# Patient Record
Sex: Female | Born: 1975
Health system: Southern US, Community
[De-identification: ages and names within clinical notes are randomized; demographics above are authoritative.]

## PROBLEM LIST (undated history)

## (undated) DIAGNOSIS — M2550 Pain in unspecified joint: Secondary | ICD-10-CM

## (undated) DIAGNOSIS — E559 Vitamin D deficiency, unspecified: Secondary | ICD-10-CM

## (undated) DIAGNOSIS — K8020 Calculus of gallbladder without cholecystitis without obstruction: Secondary | ICD-10-CM

## (undated) DIAGNOSIS — I10 Essential (primary) hypertension: Secondary | ICD-10-CM

## (undated) DIAGNOSIS — D259 Leiomyoma of uterus, unspecified: Secondary | ICD-10-CM

## (undated) DIAGNOSIS — R7303 Prediabetes: Secondary | ICD-10-CM

## (undated) DIAGNOSIS — R748 Abnormal levels of other serum enzymes: Secondary | ICD-10-CM

## (undated) DIAGNOSIS — F32A Depression, unspecified: Secondary | ICD-10-CM

## (undated) DIAGNOSIS — K760 Fatty (change of) liver, not elsewhere classified: Secondary | ICD-10-CM

## (undated) DIAGNOSIS — E669 Obesity, unspecified: Secondary | ICD-10-CM

## (undated) DIAGNOSIS — E781 Pure hyperglyceridemia: Secondary | ICD-10-CM

## (undated) DIAGNOSIS — E785 Hyperlipidemia, unspecified: Secondary | ICD-10-CM

## (undated) DIAGNOSIS — E538 Deficiency of other specified B group vitamins: Secondary | ICD-10-CM

## (undated) DIAGNOSIS — F419 Anxiety disorder, unspecified: Secondary | ICD-10-CM

## (undated) HISTORY — DX: Pure hyperglyceridemia: E78.1

## (undated) HISTORY — DX: Hyperlipidemia, unspecified: E78.5

## (undated) HISTORY — DX: Leiomyoma of uterus, unspecified: D25.9

## (undated) HISTORY — DX: Essential (primary) hypertension: I10

## (undated) HISTORY — PX: UTERINE FIBROID SURGERY: SHX826

## (undated) HISTORY — DX: Obesity, unspecified: E66.9

## (undated) HISTORY — DX: Pain in unspecified joint: M25.50

## (undated) HISTORY — DX: Deficiency of other specified B group vitamins: E53.8

## (undated) HISTORY — DX: Calculus of gallbladder without cholecystitis without obstruction: K80.20

## (undated) HISTORY — DX: Prediabetes: R73.03

## (undated) HISTORY — DX: Vitamin D deficiency, unspecified: E55.9

## (undated) HISTORY — DX: Depression, unspecified: F32.A

---
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
## 2011-10-12 LAB — CBC AND DIFFERENTIAL
HEMATOCRIT: 40 % (ref 36–46)
Hemoglobin: 13.5 g/dL (ref 12.0–16.0)
Platelets: 390 10*3/uL (ref 150–399)
WBC: 6 10^3/mL

## 2011-10-12 LAB — HEPATIC FUNCTION PANEL
ALT: 27 U/L (ref 7–35)
AST: 23 U/L (ref 13–35)
Alkaline Phosphatase: 57 U/L (ref 25–125)
BILIRUBIN, TOTAL: 1.4 mg/dL

## 2011-10-12 LAB — BASIC METABOLIC PANEL
BUN: 7 mg/dL (ref 4–21)
CREATININE: 0.5 mg/dL (ref 0.5–1.1)
Glucose: 88 mg/dL
Potassium: 4.2 mmol/L (ref 3.4–5.3)
SODIUM: 138 mmol/L (ref 137–147)

## 2011-10-12 LAB — TSH: TSH: 1.3 u[IU]/mL (ref 0.41–5.90)

## 2011-10-12 LAB — LIPID PANEL
Cholesterol: 268 mg/dL — AB (ref 0–200)
HDL: 44 mg/dL (ref 35–70)
LDL CALC: 176 mg/dL
TRIGLYCERIDES: 221 mg/dL — AB (ref 40–160)

## 2013-08-23 LAB — HEPATIC FUNCTION PANEL
ALK PHOS: 53 U/L (ref 25–125)
ALT: 81 U/L — AB (ref 7–35)
AST: 46 U/L — AB (ref 13–35)
Bilirubin, Total: 1.4 mg/dL

## 2013-08-23 LAB — BASIC METABOLIC PANEL
BUN: 6 mg/dL (ref 4–21)
CREATININE: 0.5 mg/dL (ref <=1.1)
GLUCOSE: 97 mg/dL
Potassium: 4.5 mmol/L (ref 3.4–5.3)
Sodium: 137 mmol/L (ref 137–147)

## 2013-08-23 LAB — LIPID PANEL
CHOLESTEROL: 270 mg/dL — AB (ref 0–200)
HDL: 45 mg/dL (ref 35–70)
LDL CALC: 163 mg/dL
TRIGLYCERIDES: 348 mg/dL — AB (ref 40–160)

## 2016-01-01 LAB — LIPID PANEL
Cholesterol: 270 mg/dL — AB (ref 0–200)
Cholesterol: 270 mg/dL — AB (ref 0–200)
HDL: 30 mg/dL — AB (ref 35–70)
HDL: 30 mg/dL — AB (ref 35–70)
LDL Cholesterol: 172 mg/dL
LDL Cholesterol: 172 mg/dL
TRIGLYCERIDES: 339 mg/dL — AB (ref 40–160)
Triglycerides: 339 mg/dL — AB (ref 40–160)

## 2016-01-01 LAB — BASIC METABOLIC PANEL
BUN: 9 mg/dL (ref 4–21)
BUN: 9 mg/dL (ref 4–21)
CREATININE: 0.6 mg/dL (ref 0.5–1.1)
Creatinine: 0.6 mg/dL (ref 0.5–1.1)
GLUCOSE: 116 mg/dL
Glucose: 116 mg/dL
POTASSIUM: 4.4 mmol/L (ref 3.4–5.3)
POTASSIUM: 4.4 mmol/L (ref 3.4–5.3)
SODIUM: 141 mmol/L (ref 137–147)
Sodium: 141 mmol/L (ref 137–147)

## 2016-01-01 LAB — CBC AND DIFFERENTIAL
HCT: 39 % (ref 36–46)
HCT: 39 % (ref 36–46)
HEMOGLOBIN: 13.1 g/dL (ref 12.0–16.0)
HEMOGLOBIN: 13.1 g/dL (ref 12.0–16.0)
Neutrophils Absolute: 3 /uL
Platelets: 388 10*3/uL (ref 150–399)
Platelets: 388 10*3/uL (ref 150–399)
WBC: 5.9 10*3/mL
WBC: 5.9 10^3/mL

## 2016-01-01 LAB — HEPATIC FUNCTION PANEL
ALK PHOS: 67 U/L (ref 25–125)
ALT: 69 U/L — AB (ref 7–35)
ALT: 69 U/L — AB (ref 7–35)
AST: 42 U/L — AB (ref 13–35)
AST: 42 U/L — AB (ref 13–35)
Alkaline Phosphatase: 67 U/L (ref 25–125)
Bilirubin, Total: 0.8 mg/dL

## 2016-01-01 LAB — TSH
TSH: 1.46 u[IU]/mL (ref 0.41–5.90)
TSH: 1.46 u[IU]/mL (ref 0.41–5.90)

## 2016-01-05 LAB — VITAMIN D 25 HYDROXY (VIT D DEFICIENCY, FRACTURES): Vit D, 25-Hydroxy: 39

## 2016-04-22 LAB — BASIC METABOLIC PANEL
BUN: 8 mg/dL (ref 4–21)
CREATININE: 0.6 mg/dL (ref 0.5–1.1)
GLUCOSE: 113 mg/dL
Potassium: 4.3 mmol/L (ref 3.4–5.3)
Sodium: 140 mmol/L (ref 137–147)

## 2016-04-22 LAB — LIPID PANEL
Cholesterol: 211 mg/dL — AB (ref 0–200)
HDL: 33 mg/dL — AB (ref 35–70)
LDL CALC: 128 mg/dL
Triglycerides: 251 mg/dL — AB (ref 40–160)

## 2016-04-22 LAB — CBC AND DIFFERENTIAL
HCT: 39 % (ref 36–46)
Hemoglobin: 13.1 g/dL (ref 12.0–16.0)
Neutrophils Absolute: 4 /uL
PLATELETS: 359 10*3/uL (ref 150–399)
WBC: 5.8 10*3/mL

## 2016-04-22 LAB — HEPATIC FUNCTION PANEL
ALK PHOS: 67 U/L (ref 25–125)
ALT: 40 U/L — AB (ref 7–35)
AST: 31 U/L (ref 13–35)
BILIRUBIN, TOTAL: 1.2 mg/dL

## 2016-06-03 ENCOUNTER — Ambulatory Visit (INDEPENDENT_AMBULATORY_CARE_PROVIDER_SITE_OTHER): Payer: 59 | Admitting: Family Medicine

## 2016-06-03 ENCOUNTER — Encounter: Payer: Self-pay | Admitting: Family Medicine

## 2016-06-03 VITALS — BP 124/80 | HR 86 | Resp 12 | Ht 68.0 in | Wt 250.2 lb

## 2016-06-03 DIAGNOSIS — R7301 Impaired fasting glucose: Secondary | ICD-10-CM

## 2016-06-03 DIAGNOSIS — E782 Mixed hyperlipidemia: Secondary | ICD-10-CM | POA: Diagnosis not present

## 2016-06-03 DIAGNOSIS — Z6838 Body mass index (BMI) 38.0-38.9, adult: Secondary | ICD-10-CM | POA: Diagnosis not present

## 2016-06-03 DIAGNOSIS — R7401 Elevation of levels of liver transaminase levels: Secondary | ICD-10-CM

## 2016-06-03 DIAGNOSIS — R740 Nonspecific elevation of levels of transaminase and lactic acid dehydrogenase [LDH]: Secondary | ICD-10-CM | POA: Diagnosis not present

## 2016-06-03 NOTE — Progress Notes (Signed)
Pre visit review using our clinic review tool, if applicable. No additional management support is needed unless otherwise documented below in the visit note. 

## 2016-06-03 NOTE — Patient Instructions (Signed)
A few things to remember from today's visit:   Hyperlipidemia, mixed  15 min of daily brisk walking. Wine 2-4 oz daily. Fish 3 times per week. Fish oil (Megared) 1-3 g daily.   Please be sure medication list is accurate. If a new problem present, please set up appointment sooner than planned today.

## 2016-06-03 NOTE — Progress Notes (Signed)
HPI:   [name] is a 41 y.o. female, who is here today to establish care.  Former PCP: Dr Posey Pronto, [place] Last preventive routine visit: 12/2015.  Chronic medical problems: HLD,elevated BP.   Concerns today: HLD, wt.   Hyperlipidemia:  Currently on non pharmacologic treatment. Following a low fat diet: Yes. She is not exercising regularly due to work schedule.    Lab Results  Component Value Date   CHOL 211 (A) 04/22/2016   HDL 33 (A) 04/22/2016   LDLCALC 128 04/22/2016   TRIG 251 (A) 04/22/2016   Elevated BP's in prior OV's. She is not checking BP at home in the past 2 months. Denies severe/frequent headache, visual changes, chest pain, dyspnea, palpitation, claudication, focal weakness, or edema.  -She is trying to lose wt,has started a healthier diet,drinking smoothies with fruit and vegetables in the morning. She has noted some wt loss already. + Hx of pre diabetes, FG 03/2016 113. HgA1C 5.8 03/2016.  Mother with Hx of DM II.  In the past she has also have elevated transaminases,last LFT's wnl.  Denies abdominal pain, nausea, vomiting, changes in bowel habits,or alcohol abuse. Lab Results  Component Value Date   ALT 40 (A) 04/22/2016   AST 31 04/22/2016   ALKPHOS 67 04/22/2016     Review of Systems  Constitutional: Negative for activity change, appetite change, fatigue, fever and unexpected weight change.  HENT: Negative for mouth sores, nosebleeds and trouble swallowing.   Eyes: Negative for redness and visual disturbance.  Respiratory: Negative for cough, shortness of breath and wheezing.   Cardiovascular: Negative for chest pain, palpitations and leg swelling.  Gastrointestinal: Negative for abdominal pain, nausea and vomiting.       Negative for changes in bowel habits.  Endocrine: Negative for cold intolerance and heat intolerance.  Genitourinary: Negative for decreased urine volume and hematuria.  Musculoskeletal: Negative for gait problem  and myalgias.  Neurological: Negative for syncope, weakness and headaches.  Psychiatric/Behavioral: Negative for confusion. The patient is nervous/anxious.       No current outpatient prescriptions on file prior to visit.   No current facility-administered medications on file prior to visit.      Past Medical History:  Diagnosis Date  . Hyperlipidemia   . Hypertension    Not on File  Family History  Problem Relation Age of Onset  . Diabetes Mother   . Hyperlipidemia Father   . Hypertension Father   . Cancer Maternal Grandmother   . Alcohol abuse Maternal Grandfather     Social History   Social History  . Marital status: Married    Spouse name: N/A  . Number of children: N/A  . Years of education: N/A   Social History Main Topics  . Smoking status: Never Smoker  . Smokeless tobacco: Never Used  . Alcohol use Yes  . Drug use: No  . Sexual activity: Not Asked   Other Topics Concern  . None   Social History Narrative  . None    Vitals:   06/03/16 0921  BP: 124/80  Pulse: 86  Resp: 12   O2 sat at RA 98%. Body mass index is 38.05 kg/m.   Physical Exam  Nursing note and vitals reviewed. Constitutional: She is oriented to person, place, and time. She appears well-developed. No distress.  HENT:  Head: Atraumatic.  Mouth/Throat: Oropharynx is clear and moist and mucous membranes are normal.  Eyes: Conjunctivae and EOM are normal. Pupils are equal, round,  and reactive to light.  Neck: No tracheal deviation present. No thyroid mass and no thyromegaly present.  Cardiovascular: Normal rate and regular rhythm.   No murmur heard. Pulses:      Dorsalis pedis pulses are 2+ on the right side, and 2+ on the left side.  Respiratory: Effort normal and breath sounds normal. No respiratory distress.  GI: Soft. She exhibits no mass. There is no hepatomegaly. There is no tenderness.  Musculoskeletal: She exhibits edema (trace pitting edema LE,bilateral). She exhibits  no tenderness.  Lymphadenopathy:    She has no cervical adenopathy.  Neurological: She is alert and oriented to person, place, and time. She has normal strength. Coordination and gait normal.  Skin: Skin is warm. No erythema.  Psychiatric: She has a normal mood and affect.  Well groomed, good eye contact.      ASSESSMENT AND PLAN:     [name] was seen today for establish care.  Diagnoses and all orders for this visit:  IFG (impaired fasting glucose)  Primary prevention through a healthy diet and regular physical activity recommended.  Hyperlipidemia, mixed  Continue low fat diet for now. Low HDL,wine 2-4 oz daily may help along with low fat diet. Little of no red meet. OTC Fish oil may help with elevated TG.  Last FLP in 03/2016,so will plan on repeating labs in 2-3 months.  BMI 38.0-38.9,adult  We discussed benefits of wt loss as well as adverse effects of obesity. May consider pharmacologic treatment next OV. Consistency with healthy diet and physical activity recommended. Daily brisk walking for 15-30 min as tolerated.  Elevated transaminase level  ? NASH. Low fat diet. Wt loss. Will will continue following.      Darlene G. Martinique, MD  [facility]. [place] office.

## 2016-06-04 DIAGNOSIS — M5137 Other intervertebral disc degeneration, lumbosacral region: Secondary | ICD-10-CM | POA: Diagnosis not present

## 2016-06-04 DIAGNOSIS — M5386 Other specified dorsopathies, lumbar region: Secondary | ICD-10-CM | POA: Diagnosis not present

## 2016-06-04 DIAGNOSIS — M9903 Segmental and somatic dysfunction of lumbar region: Secondary | ICD-10-CM | POA: Diagnosis not present

## 2016-06-10 ENCOUNTER — Encounter: Payer: Self-pay | Admitting: Family Medicine

## 2016-06-10 DIAGNOSIS — F411 Generalized anxiety disorder: Secondary | ICD-10-CM | POA: Insufficient documentation

## 2016-06-10 DIAGNOSIS — E559 Vitamin D deficiency, unspecified: Secondary | ICD-10-CM | POA: Insufficient documentation

## 2016-06-18 DIAGNOSIS — M9903 Segmental and somatic dysfunction of lumbar region: Secondary | ICD-10-CM | POA: Diagnosis not present

## 2016-06-18 DIAGNOSIS — M5386 Other specified dorsopathies, lumbar region: Secondary | ICD-10-CM | POA: Diagnosis not present

## 2016-06-18 DIAGNOSIS — M5137 Other intervertebral disc degeneration, lumbosacral region: Secondary | ICD-10-CM | POA: Diagnosis not present

## 2016-07-08 DIAGNOSIS — M5137 Other intervertebral disc degeneration, lumbosacral region: Secondary | ICD-10-CM | POA: Diagnosis not present

## 2016-07-08 DIAGNOSIS — M5386 Other specified dorsopathies, lumbar region: Secondary | ICD-10-CM | POA: Diagnosis not present

## 2016-07-08 DIAGNOSIS — M9903 Segmental and somatic dysfunction of lumbar region: Secondary | ICD-10-CM | POA: Diagnosis not present

## 2016-07-22 DIAGNOSIS — M9903 Segmental and somatic dysfunction of lumbar region: Secondary | ICD-10-CM | POA: Diagnosis not present

## 2016-07-22 DIAGNOSIS — M5137 Other intervertebral disc degeneration, lumbosacral region: Secondary | ICD-10-CM | POA: Diagnosis not present

## 2016-07-22 DIAGNOSIS — M5386 Other specified dorsopathies, lumbar region: Secondary | ICD-10-CM | POA: Diagnosis not present

## 2016-08-05 DIAGNOSIS — M5137 Other intervertebral disc degeneration, lumbosacral region: Secondary | ICD-10-CM | POA: Diagnosis not present

## 2016-08-05 DIAGNOSIS — M9903 Segmental and somatic dysfunction of lumbar region: Secondary | ICD-10-CM | POA: Diagnosis not present

## 2016-08-05 DIAGNOSIS — M5386 Other specified dorsopathies, lumbar region: Secondary | ICD-10-CM | POA: Diagnosis not present

## 2016-08-19 DIAGNOSIS — M5386 Other specified dorsopathies, lumbar region: Secondary | ICD-10-CM | POA: Diagnosis not present

## 2016-08-19 DIAGNOSIS — M9903 Segmental and somatic dysfunction of lumbar region: Secondary | ICD-10-CM | POA: Diagnosis not present

## 2016-08-19 DIAGNOSIS — M5137 Other intervertebral disc degeneration, lumbosacral region: Secondary | ICD-10-CM | POA: Diagnosis not present

## 2016-09-02 DIAGNOSIS — M5386 Other specified dorsopathies, lumbar region: Secondary | ICD-10-CM | POA: Diagnosis not present

## 2016-09-02 DIAGNOSIS — M9903 Segmental and somatic dysfunction of lumbar region: Secondary | ICD-10-CM | POA: Diagnosis not present

## 2016-09-02 DIAGNOSIS — M5137 Other intervertebral disc degeneration, lumbosacral region: Secondary | ICD-10-CM | POA: Diagnosis not present

## 2016-09-23 DIAGNOSIS — M9903 Segmental and somatic dysfunction of lumbar region: Secondary | ICD-10-CM | POA: Diagnosis not present

## 2016-09-23 DIAGNOSIS — M5386 Other specified dorsopathies, lumbar region: Secondary | ICD-10-CM | POA: Diagnosis not present

## 2016-09-23 DIAGNOSIS — M5137 Other intervertebral disc degeneration, lumbosacral region: Secondary | ICD-10-CM | POA: Diagnosis not present

## 2016-10-05 DIAGNOSIS — H938X3 Other specified disorders of ear, bilateral: Secondary | ICD-10-CM | POA: Diagnosis not present

## 2016-10-05 DIAGNOSIS — H6123 Impacted cerumen, bilateral: Secondary | ICD-10-CM | POA: Diagnosis not present

## 2016-10-05 DIAGNOSIS — L299 Pruritus, unspecified: Secondary | ICD-10-CM | POA: Diagnosis not present

## 2016-10-14 DIAGNOSIS — M9903 Segmental and somatic dysfunction of lumbar region: Secondary | ICD-10-CM | POA: Diagnosis not present

## 2016-10-14 DIAGNOSIS — M5386 Other specified dorsopathies, lumbar region: Secondary | ICD-10-CM | POA: Diagnosis not present

## 2016-10-14 DIAGNOSIS — M5137 Other intervertebral disc degeneration, lumbosacral region: Secondary | ICD-10-CM | POA: Diagnosis not present

## 2016-11-02 DIAGNOSIS — M5386 Other specified dorsopathies, lumbar region: Secondary | ICD-10-CM | POA: Diagnosis not present

## 2016-11-02 DIAGNOSIS — M5137 Other intervertebral disc degeneration, lumbosacral region: Secondary | ICD-10-CM | POA: Diagnosis not present

## 2016-11-02 DIAGNOSIS — M9903 Segmental and somatic dysfunction of lumbar region: Secondary | ICD-10-CM | POA: Diagnosis not present

## 2016-11-25 DIAGNOSIS — M5137 Other intervertebral disc degeneration, lumbosacral region: Secondary | ICD-10-CM | POA: Diagnosis not present

## 2016-11-25 DIAGNOSIS — M9903 Segmental and somatic dysfunction of lumbar region: Secondary | ICD-10-CM | POA: Diagnosis not present

## 2016-11-25 DIAGNOSIS — M5386 Other specified dorsopathies, lumbar region: Secondary | ICD-10-CM | POA: Diagnosis not present

## 2016-12-18 DIAGNOSIS — M5137 Other intervertebral disc degeneration, lumbosacral region: Secondary | ICD-10-CM | POA: Diagnosis not present

## 2016-12-18 DIAGNOSIS — M9903 Segmental and somatic dysfunction of lumbar region: Secondary | ICD-10-CM | POA: Diagnosis not present

## 2016-12-18 DIAGNOSIS — M5386 Other specified dorsopathies, lumbar region: Secondary | ICD-10-CM | POA: Diagnosis not present

## 2017-01-12 DIAGNOSIS — Z23 Encounter for immunization: Secondary | ICD-10-CM | POA: Diagnosis not present

## 2017-10-06 DIAGNOSIS — L821 Other seborrheic keratosis: Secondary | ICD-10-CM | POA: Diagnosis not present

## 2017-10-06 DIAGNOSIS — L720 Epidermal cyst: Secondary | ICD-10-CM | POA: Diagnosis not present

## 2017-10-06 DIAGNOSIS — D2230 Melanocytic nevi of unspecified part of face: Secondary | ICD-10-CM | POA: Diagnosis not present

## 2017-10-06 DIAGNOSIS — B353 Tinea pedis: Secondary | ICD-10-CM | POA: Diagnosis not present

## 2017-10-08 ENCOUNTER — Other Ambulatory Visit (HOSPITAL_COMMUNITY)
Admission: RE | Admit: 2017-10-08 | Discharge: 2017-10-08 | Disposition: A | Discharge status: Home / Self Care | Payer: 59 | Source: Ambulatory Visit | Attending: Obstetrics and Gynecology | Admitting: Obstetrics and Gynecology

## 2017-10-08 ENCOUNTER — Other Ambulatory Visit: Payer: Self-pay | Admitting: Obstetrics and Gynecology

## 2017-10-08 DIAGNOSIS — Z01411 Encounter for gynecological examination (general) (routine) with abnormal findings: Secondary | ICD-10-CM | POA: Insufficient documentation

## 2017-10-08 DIAGNOSIS — D219 Benign neoplasm of connective and other soft tissue, unspecified: Secondary | ICD-10-CM | POA: Diagnosis not present

## 2017-10-08 DIAGNOSIS — R635 Abnormal weight gain: Secondary | ICD-10-CM | POA: Diagnosis not present

## 2017-10-08 DIAGNOSIS — N920 Excessive and frequent menstruation with regular cycle: Secondary | ICD-10-CM | POA: Diagnosis not present

## 2017-10-12 LAB — CYTOLOGY - PAP
DIAGNOSIS: NEGATIVE
HPV: NOT DETECTED

## 2017-11-01 DIAGNOSIS — N946 Dysmenorrhea, unspecified: Secondary | ICD-10-CM | POA: Diagnosis not present

## 2017-11-01 DIAGNOSIS — D219 Benign neoplasm of connective and other soft tissue, unspecified: Secondary | ICD-10-CM | POA: Diagnosis not present

## 2017-11-19 ENCOUNTER — Ambulatory Visit: Payer: 59 | Admitting: Family Medicine

## 2017-11-19 DIAGNOSIS — Z0289 Encounter for other administrative examinations: Secondary | ICD-10-CM

## 2017-11-26 ENCOUNTER — Ambulatory Visit (INDEPENDENT_AMBULATORY_CARE_PROVIDER_SITE_OTHER): Payer: 59 | Admitting: Family Medicine

## 2017-11-26 ENCOUNTER — Encounter: Payer: Self-pay | Admitting: Family Medicine

## 2017-11-26 VITALS — BP 122/80 | HR 85 | Temp 98.4°F | Ht 68.0 in | Wt 268.3 lb

## 2017-11-26 DIAGNOSIS — R748 Abnormal levels of other serum enzymes: Secondary | ICD-10-CM | POA: Diagnosis not present

## 2017-11-26 DIAGNOSIS — R7301 Impaired fasting glucose: Secondary | ICD-10-CM | POA: Diagnosis not present

## 2017-11-26 DIAGNOSIS — Z01818 Encounter for other preprocedural examination: Secondary | ICD-10-CM

## 2017-11-26 DIAGNOSIS — N924 Excessive bleeding in the premenopausal period: Secondary | ICD-10-CM

## 2017-11-26 DIAGNOSIS — E782 Mixed hyperlipidemia: Secondary | ICD-10-CM | POA: Diagnosis not present

## 2017-11-26 LAB — COMPREHENSIVE METABOLIC PANEL
ALT: 105 U/L — ABNORMAL HIGH (ref 0–35)
AST: 66 U/L — ABNORMAL HIGH (ref 0–37)
Albumin: 4.6 g/dL (ref 3.5–5.2)
Alkaline Phosphatase: 61 U/L (ref 39–117)
BUN: 8 mg/dL (ref 6–23)
CALCIUM: 9.4 mg/dL (ref 8.4–10.5)
CHLORIDE: 101 meq/L (ref 96–112)
CO2: 27 meq/L (ref 19–32)
Creatinine, Ser: 0.64 mg/dL (ref 0.40–1.20)
GFR: 108.85 mL/min (ref >60.00)
GLUCOSE: 113 mg/dL — AB (ref 70–99)
Potassium: 4.5 mEq/L (ref 3.5–5.1)
Sodium: 136 mEq/L (ref 135–145)
Total Bilirubin: 1.6 mg/dL — ABNORMAL HIGH (ref 0.2–1.2)
Total Protein: 7.2 g/dL (ref 6.0–8.3)

## 2017-11-26 LAB — CBC WITH DIFFERENTIAL/PLATELET
BASOS ABS: 0 10*3/uL (ref 0.0–0.1)
BASOS PCT: 0.5 % (ref 0.0–3.0)
EOS ABS: 0.3 10*3/uL (ref 0.0–0.7)
Eosinophils Relative: 4 % (ref 0.0–5.0)
HEMATOCRIT: 39.2 % (ref 36.0–46.0)
Hemoglobin: 13.2 g/dL (ref 12.0–15.0)
LYMPHS ABS: 2.1 10*3/uL (ref 0.7–4.0)
LYMPHS PCT: 29.1 % (ref 12.0–46.0)
MCHC: 33.6 g/dL (ref 30.0–36.0)
MCV: 88.1 fl (ref 78.0–100.0)
MONOS PCT: 7.8 % (ref 3.0–12.0)
Monocytes Absolute: 0.6 10*3/uL (ref 0.1–1.0)
NEUTROS ABS: 4.1 10*3/uL (ref 1.4–7.7)
NEUTROS PCT: 58.6 % (ref 43.0–77.0)
PLATELETS: 363 10*3/uL (ref 150.0–400.0)
RBC: 4.45 Mil/uL (ref 3.87–5.11)
RDW: 13 % (ref 11.5–15.5)
WBC: 7.1 10*3/uL (ref 4.0–10.5)

## 2017-11-26 LAB — POCT URINALYSIS DIPSTICK
Bilirubin, UA: NEGATIVE
Blood, UA: NEGATIVE
Glucose, UA: NEGATIVE
Ketones, UA: NEGATIVE
LEUKOCYTES UA: NEGATIVE
NITRITE UA: NEGATIVE
PH UA: 6 (ref 5.0–8.0)
PROTEIN UA: NEGATIVE
Spec Grav, UA: 1.025 (ref 1.010–1.025)
UROBILINOGEN UA: 0.2 U/dL

## 2017-11-26 LAB — LIPID PANEL
CHOL/HDL RATIO: 6
CHOLESTEROL: 242 mg/dL — AB (ref 0–200)
HDL: 37.7 mg/dL — ABNORMAL LOW (ref >39.00)
NonHDL: 204.57
TRIGLYCERIDES: 331 mg/dL — AB (ref 0.0–149.0)
VLDL: 66.2 mg/dL — AB (ref 0.0–40.0)

## 2017-11-26 LAB — TSH: TSH: 1.82 u[IU]/mL (ref 0.35–4.50)

## 2017-11-26 LAB — HEMOGLOBIN A1C: Hgb A1c MFr Bld: 5.6 % (ref 4.6–6.5)

## 2017-11-26 LAB — LDL CHOLESTEROL, DIRECT: LDL DIRECT: 166 mg/dL

## 2017-11-26 NOTE — Patient Instructions (Signed)
Complete bloodwork today.   Recommend scheduling follow up physical with Dr. Martinique after your upcoming surgery.

## 2017-11-26 NOTE — Addendum Note (Signed)
Addended by: Rene Kocher on: 11/26/2017 10:36 AM   Modules accepted: Orders

## 2017-11-26 NOTE — Progress Notes (Signed)
Darlene Mccormick Date of Birth:  08-25-1975  This patient presents to the office today for a preoperative consultation at the request of surgeon, Dr. Thurnell Lose, who plans on performing myomectomy onOctober and date is not yet set.   In a years time has grown additional fibroids and ones previously there have enlarged. Largest is 13cm in diameter. Has a mixture of symptoms. Has a couple of bad, more intense days of period. Advil does help. Pressure sensation. Discomfort with intercourse - more pressure.   Planned anesthesia: general  Known anesthesia problems: Negative  Bleeding risk: no hx of bleeding; no bleeding issues with surgery Personal or FH of DVT/PE: no    Patient Active Problem List   Diagnosis Date Noted  . Vitamin D deficiency 06/10/2016  . GAD (generalized anxiety disorder) 06/10/2016  . Hyperlipidemia, mixed 06/03/2016  . Morbid obesity ([place]) 06/03/2016   Past Surgical History:  Procedure Laterality Date  . APPENDECTOMY  1997  . TONSILLECTOMY AND ADENOIDECTOMY  1988  . TRACHEOSTOMY  1979   done during epiglotitis    No Known Allergies No outpatient medications have been marked as taking for the 11/26/17 encounter (Office Visit) with Caren Macadam, MD.    Social History   Tobacco Use  . Smoking status: Never Smoker  . Smokeless tobacco: Never Used  Substance Use Topics  . Alcohol use: Yes   Family History  Problem Relation Age of Onset  . Diabetes Mother   . Other Mother        traumatic brain injury  . Hyperlipidemia Father   . Hypertension Father   . Breast cancer Maternal Grandmother   . Alcohol abuse Maternal Grandfather   . High blood pressure Brother   . Breast cancer Other   . Heart attack Paternal Grandmother 36    Review of Systems ROS is negative except for uterine/menstrual symptoms noted above. Specifically Darlene Mccormick denies and sick symptoms, chest congestion/SOB/wheezing, chest pain/palpitations.    Recent Labs: CBC:  Lab  Results  Component Value Date   WBC 5.8 04/22/2016   HGB 13.1 04/22/2016   HCT 39 04/22/2016   PLT 359 04/22/2016   CMP:  Lab Results  Component Value Date   NA 140 04/22/2016   K 4.3 04/22/2016   BUN 8 04/22/2016   CREATININE 0.6 04/22/2016   ALKPHOS 67 04/22/2016   ALT 40 (A) 04/22/2016   AST 31 04/22/2016    HBA1C: No results found for: LABA1C, EAG  Objective:   BP 122/80 (BP Location: Left Arm, Patient Position: Sitting, Cuff Size: Large)   Pulse 85   Temp 98.4 F (36.9 C) (Oral)   Ht 5\' 8"  (1.727 m)   Wt 268 lb 4.8 oz (121.7 kg)   SpO2 99%   BMI 40.79 kg/m  Weight: 268 lb 4.8 oz (121.7 kg)  Physical Exam  Constitutional: Darlene Mccormick is oriented to person, place, and time. Darlene Mccormick appears well-developed and well-nourished. No distress.  HENT:  Head: Normocephalic and atraumatic.  Right Ear: Hearing and external ear normal.  Left Ear: Hearing and external ear normal.  Mouth/Throat: Uvula is midline and oropharynx is clear and moist. No oropharyngeal exudate.  Eyes: Pupils are equal, round, and reactive to light. Conjunctivae are normal.  Neck: Normal range of motion. Neck supple. No thyroid mass and no thyromegaly present.  Cardiovascular: Normal rate, regular rhythm, normal heart sounds and intact distal pulses. Exam reveals no gallop and no friction rub.  No murmur heard. Pulmonary/Chest: Effort normal and  breath sounds normal. Darlene Mccormick has no decreased breath sounds. Darlene Mccormick has no wheezes. Darlene Mccormick has no rhonchi.  Abdominal: Soft. Bowel sounds are normal. Darlene Mccormick exhibits no distension and no mass. There is no tenderness. There is no guarding. No hernia.  Lower abdomen - largest fibroid is palpable on suprapubic exam and extends to approx 3cm inferior of umbilicus.  Musculoskeletal: Normal range of motion. Darlene Mccormick exhibits no edema, tenderness or deformity.  Lymphadenopathy:    Darlene Mccormick has no cervical adenopathy.  Neurological: Darlene Mccormick is alert and oriented to person, place, and time. Darlene Mccormick has normal  strength. Darlene Mccormick displays normal reflexes.  Reflex Scores:      Tricep reflexes are 2+ on the right side and 2+ on the left side.      Bicep reflexes are 2+ on the right side and 2+ on the left side.      Brachioradialis reflexes are 2+ on the right side and 2+ on the left side.      Patellar reflexes are 2+ on the right side and 2+ on the left side. Skin: Skin is warm and dry. No rash noted.  Psychiatric: Darlene Mccormick has a normal mood and affect. Darlene Mccormick speech is normal and behavior is normal. Thought content normal.     EKG Interpretation:  normal EKG, normal sinus rhythm, there are no previous tracings available for comparison.  Lab Review no bloodwork since 2018; labs have been reordered.     Assessment:   Darlene Mccormick was seen today for surgical clearance.  Diagnoses and all orders for this visit:  Preoperative general physical examination -     EKG 12-Lead: stable. No acute changes. Sinus rhythm and within normal limits. -     CBC with Differential/Platelet; Future -     Urinalysis  Hyperlipidemia, mixed -     Lipid panel; Future  Elevated fasting glucose -     Comprehensive metabolic panel; Future; A1C added  Elevated liver enzymes -     Comprehensive metabolic panel; Future  Excessive bleeding in premenopausal period -     CBC with Differential/Platelet; Future -     TSH; Future   42 y.o.patient  approved for Surgery     Plan:   1. Preoperative workup as follows:Darlene Mccormick is due for repeat bloodwork to evaluate hyperlipidemia, liver enzyme elevation, elevated glucose. I have ordered these and will review once available. Would like to make sure cbc stable and glucose controlled prior to surgery. 2. Change in medication regimen before surgery: none 3. No contraindications to planned surgery although recommend review of bloodwork- see above.  Note electronically signed by provider.  Micheline Rough, MD  We have faxed a copy of this note to obgyn provider.

## 2017-11-26 NOTE — Addendum Note (Signed)
Addended by: Rene Kocher on: 11/26/2017 10:37 AM   Modules accepted: Orders

## 2017-11-30 ENCOUNTER — Telehealth: Payer: Self-pay | Admitting: Family Medicine

## 2017-11-30 ENCOUNTER — Other Ambulatory Visit: Payer: Self-pay | Admitting: Family Medicine

## 2017-11-30 DIAGNOSIS — R748 Abnormal levels of other serum enzymes: Secondary | ICD-10-CM

## 2017-11-30 NOTE — Telephone Encounter (Signed)
Pt is requesting transfer of care from Dr Martinique to Dr Ethlyn Gallery.

## 2017-12-01 NOTE — Telephone Encounter (Signed)
It is fine by me.

## 2017-12-01 NOTE — Telephone Encounter (Signed)
ok 

## 2017-12-12 DIAGNOSIS — Z23 Encounter for immunization: Secondary | ICD-10-CM | POA: Diagnosis not present

## 2017-12-14 ENCOUNTER — Ambulatory Visit
Admission: RE | Admit: 2017-12-14 | Discharge: 2017-12-14 | Disposition: A | Discharge status: Home / Self Care | Payer: 59 | Source: Ambulatory Visit | Attending: Family Medicine | Admitting: Family Medicine

## 2017-12-14 DIAGNOSIS — K8020 Calculus of gallbladder without cholecystitis without obstruction: Secondary | ICD-10-CM | POA: Diagnosis not present

## 2017-12-14 DIAGNOSIS — R748 Abnormal levels of other serum enzymes: Secondary | ICD-10-CM

## 2017-12-29 ENCOUNTER — Ambulatory Visit: Payer: 59 | Admitting: Family Medicine

## 2017-12-29 ENCOUNTER — Encounter: Payer: Self-pay | Admitting: Family Medicine

## 2017-12-29 VITALS — BP 130/84 | HR 94 | Temp 98.5°F | Wt 264.9 lb

## 2017-12-29 DIAGNOSIS — R748 Abnormal levels of other serum enzymes: Secondary | ICD-10-CM | POA: Diagnosis not present

## 2017-12-29 DIAGNOSIS — D219 Benign neoplasm of connective and other soft tissue, unspecified: Secondary | ICD-10-CM

## 2017-12-29 DIAGNOSIS — R17 Unspecified jaundice: Secondary | ICD-10-CM | POA: Diagnosis not present

## 2017-12-29 DIAGNOSIS — K760 Fatty (change of) liver, not elsewhere classified: Secondary | ICD-10-CM | POA: Diagnosis not present

## 2017-12-29 NOTE — Progress Notes (Signed)
Darlene Mccormick DOB: 1975-10-15 Encounter date: 12/29/2017  This is a 42 y.o. female who presents with Chief Complaint  Patient presents with  . Follow-up    radiology found a galstone, would like to discuss    History of present illness:  Difficulty with exercise because of size of fibroids; can feel them move with bouncing exercise. Difficulty bending over. Does walk on regular basis.   Work is stressful.   Does protein shake with spinach/greens for breakfast.    We reviewed [place] results together and discussed that large stone in neck of GB does not require further evaluation/treatment unless symptoms related to gallbladder arise. We reviewed bloodwork including elevated cholesterol and elevated liver enzymes.   Plan is for myomectomy in late November.  No Known Allergies No outpatient medications have been marked as taking for the 12/29/17 encounter (Office Visit) with Caren Macadam, MD.    Review of Systems  Constitutional: Negative for chills, fatigue and fever.  Respiratory: Negative for cough, chest tightness, shortness of breath and wheezing.   Cardiovascular: Negative for chest pain, palpitations and leg swelling.  Gastrointestinal: Positive for abdominal distention (feels full from enlarged fibroids). Negative for abdominal pain, constipation, diarrhea, nausea and vomiting.       Discomfort from large fibroids. Bother her with activity (running/bouncing), bending.  Musculoskeletal: Negative for arthralgias and back pain.    Objective:  BP 130/84 (BP Location: Left Arm, Patient Position: Sitting, Cuff Size: Large)   Pulse 94   Temp 98.5 F (36.9 C) (Oral)   Wt 264 lb 14.4 oz (120.2 kg)   SpO2 98%   BMI 40.28 kg/m   Weight: 264 lb 14.4 oz (120.2 kg)   BP Readings from Last 3 Encounters:  12/29/17 130/84  11/26/17 122/80  06/03/16 124/80   Wt Readings from Last 3 Encounters:  12/29/17 264 lb 14.4 oz (120.2 kg)  11/26/17 268 lb 4.8 oz (121.7 kg)   06/03/16 250 lb 4 oz (113.5 kg)    Physical Exam  Constitutional: She is oriented to person, place, and time. She appears well-developed and well-nourished. No distress.  Cardiovascular: Normal rate, regular rhythm and normal heart sounds. Exam reveals no friction rub.  No murmur heard. No lower extremity edema  Pulmonary/Chest: Effort normal and breath sounds normal. No respiratory distress. She has no wheezes. She has no rales.  Abdominal: Soft. Bowel sounds are normal. She exhibits mass (enlarged fibroid palpable). She exhibits no distension. There is tenderness. There is no rebound and no guarding.  Neurological: She is alert and oriented to person, place, and time.  Psychiatric: She has a normal mood and affect. Her behavior is normal. Cognition and memory are normal.    Assessment/Plan 1. Fatty liver Discussed benefits of weight loss, mediterranean diet. She had read online about ways to help with fatty liver, so we just reviewed these together. We discussed benefits of HIT exercise programs and moving forward through frustrations of weight loss plateaus.  - Comprehensive metabolic panel; Future  2. Elevated liver enzymes Suspect related to fatty liver. Reassuring that alk phos normal. Will get baseline hep b/c screen. Plan to recheck enzymes for stability in a few months.  - Hepatitis B surface antigen; Future - Hepatitis c antibody (reflex); Future  3. Elevated bilirubin Recheck in a month.   4. Fibroids I did sign surgical clearance form for myomectomy. We discussed benefits of weight loss and core strengthening prior to surgery.   5. Obesity: We spent 20+ minutes of the  25 minute visit discussing weight loss techniques/plans and reviewing benefits of dietary changes. We reviewed gallbladder anatomy; [place] results and discussed potential problems with gallstones. We reviewed bloodwork and discussed fatty liver and bilirubin/liver enzyme elevation.   Return pending  bloodwork.      Micheline Rough, MD

## 2017-12-29 NOTE — Patient Instructions (Addendum)
Repeat bloodwork in 1-2 months.   Recommend Mediterranean diet and working on higher intensity exercise.    Why is Exercise Important? If I told you I had a single pill that would help you decrease stress by improving anxiety, decreasing depression, help you achieve a healthy weight, give you more energy, make you more productive, help you focus, decrease your risk of dementia/heart attack/stroke/falls, improve your bone health, and more would you be interested? These are just some of the benefits that exercise brings to you. IT IS WORTH carving out some time every day to fit in exercise. It will help in every aspect of your health. Even if you have injuries that prevent you from participating in a type of exercise you used to do; there is always something that you can do to keep exercise a part of your life. If improving your health is important, make exercise your priority. It is worth the time! If you have questions about the type of exercise that is right for you, please talk with me about this!     Exercising to Stay Healthy  Exercising regularly is important. It has many health benefits, such as:  Improving your overall fitness, flexibility, and endurance.  Increasing your bone density.  Helping with weight control.  Decreasing your body fat.  Increasing your muscle strength.  Reducing stress and tension.  Improving your overall health.   In order to become healthy and stay healthy, it is recommended that you do moderate-intensity and vigorous-intensity exercise. You can tell that you are exercising at a moderate intensity if you have a higher heart rate and faster breathing, but you are still able to hold a conversation. You can tell that you are exercising at a vigorous intensity if you are breathing much harder and faster and cannot hold a conversation while exercising. How often should I exercise? Choose an activity that you enjoy and set realistic goals. Your health care  provider can help you to make an activity plan that works for you. Exercise regularly as directed by your health care provider. This may include:  Doing resistance training twice each week, such as: ? Push-ups. ? Sit-ups. ? Lifting weights. ? Using resistance bands.  Doing a given intensity of exercise for a given amount of time. Choose from these options: ? 150 minutes of moderate-intensity exercise every week. ? 75 minutes of vigorous-intensity exercise every week. ? A mix of moderate-intensity and vigorous-intensity exercise every week.   Children, pregnant women, people who are out of shape, people who are overweight, and older adults may need to consult a health care provider for individual recommendations. If you have any sort of medical condition, be sure to consult your health care provider before starting a new exercise program. What are some exercise ideas? Some moderate-intensity exercise ideas include:  Walking at a rate of 1 mile in 15 minutes.  Biking.  Hiking.  Golfing.  Dancing.   Some vigorous-intensity exercise ideas include:  Walking at a rate of at least 4.5 miles per hour.  Jogging or running at a rate of 5 miles per hour.  Biking at a rate of at least 10 miles per hour.  Lap swimming.  Roller-skating or in-line skating.  Cross-country skiing.  Vigorous competitive sports, such as football, basketball, and soccer.  Jumping rope.  Aerobic dancing.   What are some everyday activities that can help me to get exercise?  [place] work, such as: ? Pushing a Conservation officer, nature. ? Raking and bagging leaves.  Washing and waxing your car.  Pushing a stroller.  Shoveling snow.  Gardening.  Washing windows or floors. How can I be more active in my day-to-day activities?  Use the stairs instead of the elevator.  Take a walk during your lunch break.  If you drive, park your car farther away from work or school.  If you take public transportation, get  off one stop early and walk the rest of the way.  Make all of your phone calls while standing up and walking around.  Get up, stretch, and walk around every 30 minutes throughout the day. What guidelines should I follow while exercising?  Do not exercise so much that you hurt yourself, feel dizzy, or get very short of breath.  Consult your health care provider before starting a new exercise program.  Wear comfortable clothes and shoes with good support.  Drink plenty of water while you exercise to prevent dehydration or heat stroke. Body water is lost during exercise and must be replaced.  Work out until you breathe faster and your heart beats faster. This information is not intended to replace advice given to you by your health care provider. Make sure you discuss any questions you have with your health care provider.

## 2018-01-24 DIAGNOSIS — Z01818 Encounter for other preprocedural examination: Secondary | ICD-10-CM | POA: Diagnosis not present

## 2018-01-24 DIAGNOSIS — D219 Benign neoplasm of connective and other soft tissue, unspecified: Secondary | ICD-10-CM | POA: Diagnosis not present

## 2018-01-28 ENCOUNTER — Encounter (HOSPITAL_COMMUNITY): Payer: Self-pay | Admitting: *Deleted

## 2018-02-08 NOTE — Patient Instructions (Addendum)
Your procedure is scheduled on:Wednesday November 20 at 8:30 am  Enter through the [employer] [facility] at: 7:00am  Pick up the phone at the desk and dial [phone].  Call this number if you have problems the morning of surgery: [phone].  Remember: Do NOT eat food or drink any liquids: after Midnight Tuesday   Take these medicines the morning of surgery with a SIP OF WATER:none  Stop all vitamins, supplements, herbal medications now  Do not smoke day of surgery  Brush your teeth day of surgery  Do NOT wear jewelry (body piercing), metal hair clips/bobby pins, make-up, or nail polish. Do NOT wear lotions, powders, or perfumes.  You may wear deoderant. Do NOT shave for 48 hours prior to surgery. Do NOT bring valuables to the hospital. Contacts, dentures, or bridgework may not be worn into surgery. Leave suitcase in car.  After surgery it may be brought to your room.  For patients admitted to the hospital, checkout time is 11:00 AM the day of discharge.

## 2018-02-09 ENCOUNTER — Other Ambulatory Visit: Payer: Self-pay

## 2018-02-09 ENCOUNTER — Encounter (HOSPITAL_COMMUNITY)
Admission: RE | Admit: 2018-02-09 | Discharge: 2018-02-09 | Disposition: A | Discharge status: Home / Self Care | Payer: 59 | Source: Ambulatory Visit | Attending: Obstetrics and Gynecology | Admitting: Obstetrics and Gynecology

## 2018-02-09 ENCOUNTER — Encounter (HOSPITAL_COMMUNITY): Payer: Self-pay

## 2018-02-09 DIAGNOSIS — Z01812 Encounter for preprocedural laboratory examination: Secondary | ICD-10-CM | POA: Insufficient documentation

## 2018-02-09 LAB — CBC
HEMATOCRIT: 39.3 % (ref 36.0–46.0)
Hemoglobin: 13.2 g/dL (ref 12.0–15.0)
MCH: 29.9 pg (ref 26.0–34.0)
MCHC: 33.6 g/dL (ref 30.0–36.0)
MCV: 89.1 fL (ref 80.0–100.0)
NRBC: 0 % (ref 0.0–0.2)
PLATELETS: 362 10*3/uL (ref 150–400)
RBC: 4.41 MIL/uL (ref 3.87–5.11)
RDW: 12.5 % (ref 11.5–15.5)
WBC: 6.1 10*3/uL (ref 4.0–10.5)

## 2018-02-15 NOTE — Anesthesia Preprocedure Evaluation (Addendum)
Anesthesia Evaluation  Patient identified by MRN, date of birth, ID band Patient awake    Reviewed: Allergy & Precautions, NPO status , Patient's Chart, lab work & pertinent test results  History of Anesthesia Complications Negative for: history of anesthetic complications  Airway Mallampati: III  TM Distance: >3 FB Neck ROM: Full    Dental no notable dental hx. (+) Teeth Intact, Dental Advisory Given   Pulmonary neg pulmonary ROS,    Pulmonary exam normal breath sounds clear to auscultation       Cardiovascular hypertension, Normal cardiovascular exam Rhythm:Regular Rate:Normal     Neuro/Psych Anxiety negative neurological ROS     GI/Hepatic negative GI ROS, Neg liver ROS,   Endo/Other  Morbid obesity  Renal/GU negative Renal ROS     Musculoskeletal negative musculoskeletal ROS (+)   Abdominal   Peds  Hematology negative hematology ROS (+)   Anesthesia Other Findings Day of surgery medications reviewed with the patient.  Reproductive/Obstetrics                           Anesthesia Physical Anesthesia Plan  ASA: III  Anesthesia Plan: General   Post-op Pain Management:    Induction: Intravenous  PONV Risk Score and Plan: 3 and Treatment may vary due to age or medical condition, Ondansetron, Dexamethasone and Midazolam  Airway Management Planned: Oral ETT  Additional Equipment:   Intra-op Plan:   Post-operative Plan: Extubation in OR  Informed Consent: I have reviewed the patients History and Physical, chart, labs and discussed the procedure including the risks, benefits and alternatives for the proposed anesthesia with the patient or authorized representative who has indicated his/her understanding and acceptance.   Dental advisory given  Plan Discussed with: CRNA  Anesthesia Plan Comments:        Anesthesia Quick Evaluation

## 2018-02-16 ENCOUNTER — Encounter (HOSPITAL_COMMUNITY)
Admission: AD | Disposition: A | Discharge status: Home / Self Care | Payer: Self-pay | Source: Home / Self Care | Attending: Obstetrics and Gynecology

## 2018-02-16 ENCOUNTER — Inpatient Hospital Stay (HOSPITAL_COMMUNITY): Payer: 59 | Admitting: Anesthesiology

## 2018-02-16 ENCOUNTER — Other Ambulatory Visit: Payer: Self-pay

## 2018-02-16 ENCOUNTER — Encounter (HOSPITAL_COMMUNITY): Payer: Self-pay | Admitting: Anesthesiology

## 2018-02-16 ENCOUNTER — Inpatient Hospital Stay (HOSPITAL_COMMUNITY)
Admission: AD | Admit: 2018-02-16 | Discharge: 2018-02-18 | DRG: 742 | Disposition: A | Discharge status: Home / Self Care | Payer: 59 | Attending: Obstetrics and Gynecology | Admitting: Obstetrics and Gynecology

## 2018-02-16 DIAGNOSIS — N838 Other noninflammatory disorders of ovary, fallopian tube and broad ligament: Secondary | ICD-10-CM | POA: Diagnosis present

## 2018-02-16 DIAGNOSIS — D219 Benign neoplasm of connective and other soft tissue, unspecified: Secondary | ICD-10-CM | POA: Diagnosis present

## 2018-02-16 DIAGNOSIS — Z6841 Body Mass Index (BMI) 40.0 and over, adult: Secondary | ICD-10-CM | POA: Diagnosis not present

## 2018-02-16 DIAGNOSIS — D259 Leiomyoma of uterus, unspecified: Principal | ICD-10-CM | POA: Diagnosis present

## 2018-02-16 DIAGNOSIS — Z9889 Other specified postprocedural states: Secondary | ICD-10-CM

## 2018-02-16 HISTORY — PX: MYOMECTOMY: SHX85

## 2018-02-16 HISTORY — DX: Anxiety disorder, unspecified: F41.9

## 2018-02-16 HISTORY — DX: Fatty (change of) liver, not elsewhere classified: K76.0

## 2018-02-16 HISTORY — DX: Abnormal levels of other serum enzymes: R74.8

## 2018-02-16 LAB — COMPREHENSIVE METABOLIC PANEL
ALK PHOS: 57 U/L (ref 38–126)
ALT: 69 U/L — ABNORMAL HIGH (ref 0–44)
AST: 42 U/L — ABNORMAL HIGH (ref 15–41)
Albumin: 4.4 g/dL (ref 3.5–5.0)
Anion gap: 11 (ref 5–15)
BILIRUBIN TOTAL: 1.5 mg/dL — AB (ref 0.3–1.2)
BUN: 10 mg/dL (ref 6–20)
CALCIUM: 8.9 mg/dL (ref 8.9–10.3)
CO2: 22 mmol/L (ref 22–32)
Chloride: 104 mmol/L (ref 98–111)
Creatinine, Ser: 0.65 mg/dL (ref 0.44–1.00)
Glucose, Bld: 121 mg/dL — ABNORMAL HIGH (ref 70–99)
Potassium: 4.1 mmol/L (ref 3.5–5.1)
Sodium: 137 mmol/L (ref 135–145)
TOTAL PROTEIN: 7.7 g/dL (ref 6.5–8.1)

## 2018-02-16 LAB — CBC
HEMATOCRIT: 40.2 % (ref 36.0–46.0)
Hemoglobin: 13.4 g/dL (ref 12.0–15.0)
MCH: 29.8 pg (ref 26.0–34.0)
MCHC: 33.3 g/dL (ref 30.0–36.0)
MCV: 89.3 fL (ref 80.0–100.0)
Platelets: 340 10*3/uL (ref 150–400)
RBC: 4.5 MIL/uL (ref 3.87–5.11)
RDW: 12.6 % (ref 11.5–15.5)
WBC: 7.5 10*3/uL (ref 4.0–10.5)
nRBC: 0 % (ref 0.0–0.2)

## 2018-02-16 LAB — TYPE AND SCREEN
ABO/RH(D): O POS
ANTIBODY SCREEN: NEGATIVE

## 2018-02-16 LAB — ABO/RH: ABO/RH(D): O POS

## 2018-02-16 LAB — PREGNANCY, URINE: Preg Test, Ur: NEGATIVE

## 2018-02-16 SURGERY — MYOMECTOMY, ABDOMINAL APPROACH
Anesthesia: General

## 2018-02-16 MED ORDER — DEXAMETHASONE SODIUM PHOSPHATE 4 MG/ML IJ SOLN
INTRAMUSCULAR | Status: DC | PRN
  Administered 2018-02-16: 8 mg via INTRAVENOUS

## 2018-02-16 MED ORDER — MIDAZOLAM HCL 2 MG/2ML IJ SOLN
INTRAMUSCULAR | Status: DC | PRN
  Administered 2018-02-16: 2 mg via INTRAVENOUS

## 2018-02-16 MED ORDER — SCOPOLAMINE 1 MG/3DAYS TD PT72
MEDICATED_PATCH | TRANSDERMAL | Status: AC
  Administered 2018-02-16: 1.5 mg via TRANSDERMAL
  Filled 2018-02-16: qty 1

## 2018-02-16 MED ORDER — HYDROMORPHONE HCL 1 MG/ML IJ SOLN
INTRAMUSCULAR | Status: DC | PRN
  Administered 2018-02-16: 1 mg via INTRAVENOUS

## 2018-02-16 MED ORDER — CEFAZOLIN SODIUM-DEXTROSE 2-3 GM-%(50ML) IV SOLR
INTRAVENOUS | Status: DC | PRN
  Administered 2018-02-16: 2 g via INTRAVENOUS

## 2018-02-16 MED ORDER — DEXAMETHASONE SODIUM PHOSPHATE 4 MG/ML IJ SOLN
INTRAMUSCULAR | Status: AC
  Filled 2018-02-16: qty 1

## 2018-02-16 MED ORDER — VASOPRESSIN 20 UNIT/ML IV SOLN
INTRAVENOUS | Status: DC | PRN
  Administered 2018-02-16: 19 mL via INTRAMUSCULAR

## 2018-02-16 MED ORDER — ONDANSETRON HCL 4 MG/2ML IJ SOLN
INTRAMUSCULAR | Status: DC | PRN
  Administered 2018-02-16: 4 mg via INTRAVENOUS

## 2018-02-16 MED ORDER — FENTANYL CITRATE (PF) 100 MCG/2ML IJ SOLN
INTRAMUSCULAR | Status: AC
  Filled 2018-02-16: qty 2

## 2018-02-16 MED ORDER — DOCUSATE SODIUM 100 MG PO CAPS
100.0000 mg | ORAL_CAPSULE | Freq: Two times a day (BID) | ORAL | Status: DC
  Administered 2018-02-16 – 2018-02-18 (×5): 100 mg via ORAL
  Filled 2018-02-16 (×6): qty 1

## 2018-02-16 MED ORDER — METOCLOPRAMIDE HCL 5 MG/ML IJ SOLN
INTRAMUSCULAR | Status: DC | PRN
  Administered 2018-02-16: 10 mg via INTRAVENOUS

## 2018-02-16 MED ORDER — LIDOCAINE HCL (CARDIAC) PF 100 MG/5ML IV SOSY
PREFILLED_SYRINGE | INTRAVENOUS | Status: DC | PRN
  Administered 2018-02-16: 40 mg via INTRAVENOUS

## 2018-02-16 MED ORDER — SIMETHICONE 80 MG PO CHEW: 80.0000 mg | CHEWABLE_TABLET | Freq: Four times a day (QID) | ORAL | Status: DC | PRN

## 2018-02-16 MED ORDER — PROMETHAZINE HCL 25 MG/ML IJ SOLN: 6.2500 mg | INTRAMUSCULAR | Status: DC | PRN

## 2018-02-16 MED ORDER — METOCLOPRAMIDE HCL 5 MG/ML IJ SOLN
INTRAMUSCULAR | Status: AC
  Filled 2018-02-16: qty 2

## 2018-02-16 MED ORDER — HYDROMORPHONE HCL 1 MG/ML IJ SOLN: 0.2000 mg | INTRAMUSCULAR | Status: DC | PRN

## 2018-02-16 MED ORDER — DEXTROSE 5 % IV SOLN
3.0000 g | INTRAVENOUS | Status: DC
  Filled 2018-02-16: qty 3000

## 2018-02-16 MED ORDER — TRAMADOL HCL 50 MG PO TABS: 50.0000 mg | ORAL_TABLET | Freq: Four times a day (QID) | ORAL | Status: DC | PRN

## 2018-02-16 MED ORDER — BUPIVACAINE LIPOSOME 1.3 % IJ SUSP
20.0000 mL | Freq: Once | INTRAMUSCULAR | Status: AC
  Administered 2018-02-16: 20 mL
  Filled 2018-02-16: qty 20

## 2018-02-16 MED ORDER — MENTHOL 3 MG MT LOZG
1.0000 | LOZENGE | OROMUCOSAL | Status: DC | PRN
  Administered 2018-02-16: 3 mg via ORAL
  Filled 2018-02-16: qty 9

## 2018-02-16 MED ORDER — ONDANSETRON HCL 4 MG/2ML IJ SOLN: 4.0000 mg | Freq: Four times a day (QID) | INTRAMUSCULAR | Status: DC | PRN

## 2018-02-16 MED ORDER — PROPOFOL 10 MG/ML IV BOLUS
INTRAVENOUS | Status: AC
  Filled 2018-02-16: qty 20

## 2018-02-16 MED ORDER — MIDAZOLAM HCL 2 MG/2ML IJ SOLN
INTRAMUSCULAR | Status: AC
  Filled 2018-02-16: qty 2

## 2018-02-16 MED ORDER — ONDANSETRON HCL 4 MG/2ML IJ SOLN
INTRAMUSCULAR | Status: AC
  Filled 2018-02-16: qty 2

## 2018-02-16 MED ORDER — FENTANYL CITRATE (PF) 250 MCG/5ML IJ SOLN
INTRAMUSCULAR | Status: AC
  Filled 2018-02-16: qty 5

## 2018-02-16 MED ORDER — LIDOCAINE HCL (CARDIAC) PF 100 MG/5ML IV SOSY
PREFILLED_SYRINGE | INTRAVENOUS | Status: AC
  Filled 2018-02-16: qty 5

## 2018-02-16 MED ORDER — OXYCODONE HCL 5 MG/5ML PO SOLN: 5.0000 mg | Freq: Once | ORAL | Status: DC | PRN

## 2018-02-16 MED ORDER — SODIUM CHLORIDE (PF) 0.9 % IJ SOLN
INTRAMUSCULAR | Status: AC
  Filled 2018-02-16: qty 100

## 2018-02-16 MED ORDER — ALUM & MAG HYDROXIDE-SIMETH 200-200-20 MG/5ML PO SUSP: 30.0000 mL | ORAL | Status: DC | PRN

## 2018-02-16 MED ORDER — HYDROMORPHONE HCL 1 MG/ML IJ SOLN
INTRAMUSCULAR | Status: AC
  Filled 2018-02-16: qty 1

## 2018-02-16 MED ORDER — FENTANYL CITRATE (PF) 100 MCG/2ML IJ SOLN: 25.0000 ug | INTRAMUSCULAR | Status: DC | PRN

## 2018-02-16 MED ORDER — PROPOFOL 10 MG/ML IV BOLUS
INTRAVENOUS | Status: DC | PRN
  Administered 2018-02-16: 200 mg via INTRAVENOUS
  Administered 2018-02-16 (×2): 40 mg via INTRAVENOUS

## 2018-02-16 MED ORDER — SCOPOLAMINE 1 MG/3DAYS TD PT72
1.0000 | MEDICATED_PATCH | Freq: Once | TRANSDERMAL | Status: DC
  Administered 2018-02-16: 1.5 mg via TRANSDERMAL

## 2018-02-16 MED ORDER — OXYCODONE HCL 5 MG PO TABS: 5.0000 mg | ORAL_TABLET | Freq: Once | ORAL | Status: DC | PRN

## 2018-02-16 MED ORDER — ENOXAPARIN SODIUM 60 MG/0.6ML ~~LOC~~ SOLN
60.0000 mg | SUBCUTANEOUS | Status: DC
  Administered 2018-02-17 – 2018-02-18 (×2): 60 mg via SUBCUTANEOUS
  Filled 2018-02-16 (×2): qty 0.6

## 2018-02-16 MED ORDER — KETOROLAC TROMETHAMINE 30 MG/ML IJ SOLN
30.0000 mg | Freq: Four times a day (QID) | INTRAMUSCULAR | Status: DC
  Administered 2018-02-16 (×2): 30 mg via INTRAVENOUS
  Filled 2018-02-16 (×3): qty 1

## 2018-02-16 MED ORDER — LACTATED RINGERS IV SOLN
INTRAVENOUS | Status: DC
  Administered 2018-02-16 (×4): via INTRAVENOUS

## 2018-02-16 MED ORDER — ENOXAPARIN SODIUM 40 MG/0.4ML ~~LOC~~ SOLN: 40.0000 mg | SUBCUTANEOUS | Status: DC

## 2018-02-16 MED ORDER — SUGAMMADEX SODIUM 200 MG/2ML IV SOLN
INTRAVENOUS | Status: DC | PRN
  Administered 2018-02-16: 240 mg via INTRAVENOUS

## 2018-02-16 MED ORDER — ONDANSETRON HCL 4 MG PO TABS: 4.0000 mg | ORAL_TABLET | Freq: Four times a day (QID) | ORAL | Status: DC | PRN

## 2018-02-16 MED ORDER — VASOPRESSIN 20 UNIT/ML IV SOLN
INTRAVENOUS | Status: AC
  Filled 2018-02-16: qty 1

## 2018-02-16 MED ORDER — PANTOPRAZOLE SODIUM 40 MG PO TBEC
40.0000 mg | DELAYED_RELEASE_TABLET | Freq: Every day | ORAL | Status: DC
  Administered 2018-02-16 – 2018-02-18 (×3): 40 mg via ORAL
  Filled 2018-02-16 (×3): qty 1

## 2018-02-16 MED ORDER — FENTANYL CITRATE (PF) 100 MCG/2ML IJ SOLN
INTRAMUSCULAR | Status: DC | PRN
  Administered 2018-02-16 (×4): 50 ug via INTRAVENOUS
  Administered 2018-02-16: 100 ug via INTRAVENOUS
  Administered 2018-02-16: 50 ug via INTRAVENOUS

## 2018-02-16 MED ORDER — GLYCOPYRROLATE 0.2 MG/ML IJ SOLN
INTRAMUSCULAR | Status: DC | PRN
  Administered 2018-02-16: 0.1 mg via INTRAVENOUS

## 2018-02-16 MED ORDER — ROCURONIUM BROMIDE 100 MG/10ML IV SOLN
INTRAVENOUS | Status: AC
  Filled 2018-02-16: qty 1

## 2018-02-16 MED ORDER — BUPIVACAINE HCL (PF) 0.25 % IJ SOLN
INTRAMUSCULAR | Status: AC
  Filled 2018-02-16: qty 30

## 2018-02-16 MED ORDER — DIPHENHYDRAMINE HCL 50 MG/ML IJ SOLN
INTRAMUSCULAR | Status: DC | PRN
  Administered 2018-02-16: 6.25 mg via INTRAVENOUS

## 2018-02-16 MED ORDER — ROCURONIUM BROMIDE 100 MG/10ML IV SOLN
INTRAVENOUS | Status: DC | PRN
  Administered 2018-02-16 (×2): 10 mg via INTRAVENOUS
  Administered 2018-02-16: 70 mg via INTRAVENOUS
  Administered 2018-02-16 (×2): 10 mg via INTRAVENOUS
  Administered 2018-02-16: 5 mg via INTRAVENOUS
  Administered 2018-02-16: 20 mg via INTRAVENOUS

## 2018-02-16 MED ORDER — ACETAMINOPHEN 10 MG/ML IV SOLN: 1000.0000 mg | Freq: Once | INTRAVENOUS | Status: DC | PRN

## 2018-02-16 MED ORDER — LACTATED RINGERS IV SOLN
INTRAVENOUS | Status: DC
  Administered 2018-02-16 – 2018-02-17 (×2): via INTRAVENOUS

## 2018-02-16 MED ORDER — BUPIVACAINE HCL (PF) 0.25 % IJ SOLN
INTRAMUSCULAR | Status: DC | PRN
  Administered 2018-02-16: 20 mL

## 2018-02-16 MED ORDER — KETOROLAC TROMETHAMINE 30 MG/ML IJ SOLN: 30.0000 mg | Freq: Four times a day (QID) | INTRAMUSCULAR | Status: DC

## 2018-02-16 MED ORDER — IBUPROFEN 800 MG PO TABS
800.0000 mg | ORAL_TABLET | Freq: Three times a day (TID) | ORAL | Status: DC | PRN
  Administered 2018-02-17 – 2018-02-18 (×4): 800 mg via ORAL
  Filled 2018-02-16 (×4): qty 1

## 2018-02-16 SURGICAL SUPPLY — 41 items
BARRIER ADHS 3X4 INTERCEED (GAUZE/BANDAGES/DRESSINGS) ×4 IMPLANT
BENZOIN TINCTURE PRP APPL 2/3 (GAUZE/BANDAGES/DRESSINGS) ×2 IMPLANT
CANISTER SUCT 3000ML PPV (MISCELLANEOUS) ×2 IMPLANT
CONT PATH 16OZ SNAP LID 3702 (MISCELLANEOUS) ×2 IMPLANT
DRAPE CESAREAN BIRTH W POUCH (DRAPES) ×2 IMPLANT
DRSG OPSITE POSTOP 4X10 (GAUZE/BANDAGES/DRESSINGS) ×2 IMPLANT
DURAPREP 26ML APPLICATOR (WOUND CARE) ×2 IMPLANT
GAUZE 4X4 16PLY RFD (DISPOSABLE) ×2 IMPLANT
GAUZE SPONGE 4X4 12PLY STRL (GAUZE/BANDAGES/DRESSINGS) ×2 IMPLANT
GLOVE BIO SURGEON STRL SZ7 (GLOVE) ×2 IMPLANT
GLOVE BIOGEL PI IND STRL 7.0 (GLOVE) ×3 IMPLANT
GLOVE BIOGEL PI INDICATOR 7.0 (GLOVE) ×3
GOWN STRL REUS W/TWL LRG LVL3 (GOWN DISPOSABLE) ×6 IMPLANT
HEMOSTAT SURGICEL 2X14 (HEMOSTASIS) IMPLANT
NEEDLE HYPO 22GX1.5 SAFETY (NEEDLE) ×2 IMPLANT
NEEDLE HYPO 25X1 1.5 SAFETY (NEEDLE) ×2 IMPLANT
NS IRRIG 1000ML POUR BTL (IV SOLUTION) ×2 IMPLANT
PACK ABDOMINAL GYN (CUSTOM PROCEDURE TRAY) ×2 IMPLANT
PAD ABD 7.5X8 STRL (GAUZE/BANDAGES/DRESSINGS) ×2 IMPLANT
PAD OB MATERNITY 4.3X12.25 (PERSONAL CARE ITEMS) ×2 IMPLANT
PENCIL SMOKE EVAC W/HOLSTER (ELECTROSURGICAL) ×2 IMPLANT
PROTECTOR NERVE ULNAR (MISCELLANEOUS) ×2 IMPLANT
RTRCTR C-SECT PINK 25CM LRG (MISCELLANEOUS) ×2 IMPLANT
SPONGE LAP 18X18 RF (DISPOSABLE) ×4 IMPLANT
STAPLER VISISTAT 35W (STAPLE) IMPLANT
STRIP CLOSURE SKIN 1/2X4 (GAUZE/BANDAGES/DRESSINGS) ×2 IMPLANT
SUT CHROMIC 2 0 CT 1 (SUTURE) IMPLANT
SUT MON AB 3-0 SH 27 (SUTURE) ×2
SUT MON AB 3-0 SH27 (SUTURE) ×2 IMPLANT
SUT MON AB 4-0 PS1 27 (SUTURE) ×2 IMPLANT
SUT PLAIN 2 0 XLH (SUTURE) ×2 IMPLANT
SUT VIC AB 0 CT1 18XCR BRD8 (SUTURE) ×2 IMPLANT
SUT VIC AB 0 CT1 27 (SUTURE) ×2
SUT VIC AB 0 CT1 27XBRD ANBCTR (SUTURE) ×2 IMPLANT
SUT VIC AB 0 CT1 8-18 (SUTURE) ×2
SUT VIC AB 2-0 CT1 (SUTURE) ×2 IMPLANT
SUT VIC AB 3-0 SH 27 (SUTURE) ×2
SUT VIC AB 3-0 SH 27X BRD (SUTURE) ×2 IMPLANT
SYR CONTROL 10ML LL (SYRINGE) ×4 IMPLANT
TOWEL OR 17X24 6PK STRL BLUE (TOWEL DISPOSABLE) ×4 IMPLANT
TRAY FOLEY W/BAG SLVR 14FR (SET/KITS/TRAYS/PACK) ×2 IMPLANT

## 2018-02-16 NOTE — Op Note (Signed)
Darlene Mccormick, Darlene Mccormick MEDICAL RECORD [id number] ACCOUNT [id number] DATE OF BIRTH:05/15/1975 FACILITY: [facility] LOCATION: [place], [place]  OPERATIVE REPORT  DATE OF PROCEDURE:  02/16/2018  PREOPERATIVE DIAGNOSES:  Fibroids.  POSTOPERATIVE DIAGNOSIS:  Fibroids.  PROCEDURE:  Open myomectomy and removal of right paratubal cyst.  SURGEON:  Thurnell Lose, MD  ASSISTANT:  Dr. Christophe Louis and technician.  ANESTHESIA:  General and local.  ESTIMATED BLOOD LOSS:  550 mL.  BLOOD ADMINISTERED:  None.  Local is vasopressin and Exparel mixed with bupivacaine.  SPECIMENS:  Fibroid uterus.  There was a paratubal cyst.  DISPOSITION OF SPECIMEN:  Pathology.  DISPOSITION:  To PACU hemodynamically stable.  FINDINGS:  Large fibroid uterus; 14 fibroids were removed, weighing 549 grams.  Normal fallopian tubes and ovaries bilaterally.  Large amount of preperitoneal fat.  DESCRIPTION OF PROCEDURE:  The patient was identified in the holding area.  She was then taken to the operating room with IV running.  She was placed in the dorsal supine position.  She underwent general anesthesia without complication.  She was prepped  and draped in a normal sterile fashion.  SCDs were on her legs and operating.  She received Ancef 2 grams IV prior to the procedure.  Timeout was performed.  A Pfannenstiel skin incision was marked from the belly and then made with the scalpel and  carried down to the underlying layer of the fascia with the Bovie.  Fascia was incised at the midline and extended laterally with curved Mayo scissors.  Rectus muscles were dissected sharply off of the rectus muscles.  Again, the patient had a lot of  preperitoneal fat which was noted from when muscles were separated using the Bovie.  Peritoneum was identified and entered sharply with the Metzenbaum scissors and stretched.  I was able to feel the fibroid and bring it through, but I could not reach the  lower  fibroids in the pelvis, so the Alexis retractor was placed.  The fibroid was removed in a similar fashion.  Vasopressin was used and the incision was made with the Bovie cautery.  The myometrium was then grasped with the Allis clamps and once the fibroid revealed a towel clip was used to grasp the fibroid.  The  largest fibroid was a pedunculated fibroid.  Once we found the plane, it was removed at the base.  There was a second fibroid also on the fundus that was incised in a similar fashion and removed, which had a lot of redundant tissue in the fundus, which  was ultimately trimmed and closed.  The largest fibroids were in the fundus.  There were several in the myometrium on the right and the left.  The endometrial cavity was entered and there was a submucosal fibroid or polyp that was removed. Once all of  the fibroids were removed, 0 Vicryl pop sutures were used in an interrupted fashion to close the first layer.  Then, ____ the second layer with a 0 Vicryl and once the incision was well approximated, the serosa was then closed with 3-0 Monocryl on an SH  needle in a baseball stitch fashion.  Once we closed all of the deep  wounds, we then removed one on the posterior edge on the left that was well away from the tube.  That was also closed within the deep layers with 0 Vicryl in a similar fashion and then  2 superficial wound that were bovied off and removed.  Once the incisions were closed, the  uterus was hemostatic.  Interceed was then applied over the incision and moistened with saline.  Copious irrigation of the abdomen was performed prior to closure.  Also, during the procedure,  the bowel was retracted  with moistened laparotomy sponges.  All those were removed.  The peritoneum was then reapproximated with 2-0 Vicryl in a continuous fashion.  The rectus muscles were inspected for bleeding.  The fascia was then closed with 0 Vicryl x2.  The  Exparel/bupivacaine mixture was then injected into the  fascia.  Then the subcutaneous space was then reapproximated with 2-0 plain gut in a little bit of the Exparel mixture was injected and the skin was reapproximated with 4-0 Monocryl and then the  remainder of the Exparel was injected near incision with the fanning out at the edges.  Steri-Strips had been applied and honeycomb and pressure dressing were applied.  The urine was nice and clear during the procedure.  The patient remained stable.  Again for visualization, her uterus was deep in her pelvis.  She did have to remain in Trendelenburg for a good portion of the procedure.  All sponge and instrument counts were correct x3.  The patient was taken to the recovery room in stable condition.  AN/NUANCE  D:02/16/2018 T:02/16/2018 JOB:[id number]

## 2018-02-16 NOTE — Anesthesia Procedure Notes (Signed)
Procedure Name: Intubation Date/Time: 02/16/2018 8:50 AM Performed by: Flossie Dibble, CRNA Pre-anesthesia Checklist: Patient identified, Patient being monitored, Timeout performed, Emergency Drugs available and Suction available Patient Re-evaluated:Patient Re-evaluated prior to induction Oxygen Delivery Method: Circle System Utilized Preoxygenation: Pre-oxygenation with 100% oxygen Induction Type: IV induction Ventilation: Mask ventilation without difficulty Laryngoscope Size: Mac, 4 and Glidescope (Grrade 1 virew with LowPro 3) Grade View: Grade III Tube type: Oral Tube size: 7.0 mm Number of attempts: 2 Airway Equipment and Method: stylet Placement Confirmation: ETT inserted through vocal cords under direct vision,  positive ETCO2 and breath sounds checked- equal and bilateral Secured at: 21 cm Tube secured with: Tape Dental Injury: Teeth and Oropharynx as per pre-operative assessment  Difficulty Due To: Difficulty was anticipated, Difficult Airway- due to anterior larynx and Difficult Airway- due to large tongue Comments: See quick note.

## 2018-02-16 NOTE — OR Nursing (Signed)
SPECIMEN WEIGHED IN OR. 548.8 GRAMS

## 2018-02-16 NOTE — H&P (Signed)
History of Present Illness  General:          Pt presents for preop for open myomectomy on 02/16/18.        Pt has 9 fibroids, the largest is 12 cm fundal and pedunculated. Pt has had increased abdominal girth with pressure.    Current Medications  TakingIbuprofen 800 MG Tablet 1 tablet Orally every 8 hrs x 24 hours prior to menses then q 8 hours prn pain    Multi For Her - Tablet as directed Orally    Medication List reviewed and reconciled with the patient        Past Medical History       Morbid Obesity (BMI =40 10/2017).            Surgical History  tonsillectomy 1989  appendectomy 1997      Family History  Father: alive 52 yrs  Mother: alive 8 yrs, diagnosed with Diabetes  Paternal [place] Mother: deceased, Heart disease  Maternal Grand Mother: deceased, Breast cancer      Social History  General:         Alcohol: yes, wine.        Children: none.        Tobacco use               cigarettes:  Never smoked             Tobacco history last updated  01/24/2018       Marital Status: married.        no Recreational drug use.        OCCUPATION: Education officer, environmental at [employer].        Exercise: yes, walks.     Gyn History  Sexual activity currently sexually active.   Periods : every month.   LMP 01/04/2018-01/08/2018.   Birth control condoms.   Last pap smear date 09/2017 Neg/HPVneg.   Denies H/O Last mammogram date.   Abnormal pap smear treated with LEEP 20 yrs ago.   STD HPV.      OB History  Never been pregnant  per patient, None.      Allergies  N.K.D.A.      Hospitalization/Major Diagnostic Procedure  None this past yr 09/2017      Review of Systems  Denies fever/chills, chest pain, SOB, headaches, numbness/tingling. No h/o complication with anesthesia, bleeding disorders or blood clots.    Vital Signs  Wt 263.7, Wt change -3 lb, Ht 68, BMI 40.09, Pulse sitting 82, BP sitting 130/90.    Physical Examination  GENERAL:          Patient appears   alert and oriented.          General Appearance:  well-appearing, well-developed, no acute distress.          Speech:  clear.   LUNGS:          Auscultation:  no wheezing/rhonchi/rales. CTA bilaterally.   HEART:          Heart sounds:  normal. RRR. no murmur.   ABDOMEN:          General:  soft nontender, obese, palpable fibroid uterus.   FEMALE GENITOURINARY:          Pelvic  Not examined.   EXTREMITIES:          General:  No edema or calf tenderness.           Assessments    1. Encounter for other preprocedural examination -  Z01.818 (Primary)      Treatment  1. Encounter for other preprocedural examination   Notes: Pt counseled on R/B/A of procedure, including but not limited to infection, bleeding and injury to organs in the abdomen. Pt understands of excessive bleeding encountered, hysterectomy may be necessary. After a long discussion, pt prefers to remove fibroids and take chance of bleeding if fibroid is in a high risk area. She also understands she may need cesarean section for future deliveries.             Follow Up  2 Weeks post op. 2 Weeks post op. 2 Weeks post op

## 2018-02-16 NOTE — Interval H&P Note (Signed)
History and Physical Interval Note:  02/16/2018 8:26 AM  Darlene Mccormick  has presented today for surgery, with the diagnosis of D21.9  The various methods of treatment have been discussed with the patient and family. After consideration of risks, benefits and other options for treatment, the patient has consented to  Procedure(s): ABDOMINAL MYOMECTOMY (N/A) as a surgical intervention .  The patient's history has been reviewed, patient examined, no change in status, stable for surgery.  I have reviewed the patient's chart and labs.  Questions were answered to the patient's satisfaction.    Elevated LFTS due to fatty liver per PCP.  LFTs this am are trending down.  Thurnell Lose

## 2018-02-16 NOTE — Brief Op Note (Signed)
02/16/2018  12:45 PM  PATIENT:  Darlene Mccormick  42 y.o. female  PRE-OPERATIVE DIAGNOSIS:  Fibroids  POST-OPERATIVE DIAGNOSIS:  Same, Paratubal cyst.  PROCEDURE:  Procedure(s): ABDOMINAL MYOMECTOMY (N/A)  Removal of right paratubal cyst  SURGEON:  Surgeon(s) and Role:    Thurnell Lose, MD - Primary    * Christophe Louis, MD - Assisting  PHYSICIAN ASSISTANT: None  ASSISTANTS: Dr. Landry Mellow   ANESTHESIA:   General, Local  EBL:  550 mL   BLOOD ADMINISTERED:none  DRAINS: Urinary Catheter (Foley)   LOCAL MEDICATIONS USED:  OTHER Vasopressin, Exparel mixed with Bupivicaine  SPECIMEN:  Source of Specimen:  Fibroids, uterine serosa, paratubal cyst  DISPOSITION OF SPECIMEN:  PATHOLOGY  COUNTS:  YES  TOURNIQUET:  * No tourniquets in log *  DICTATION: .Other Dictation: Dictation Number [id number]  PLAN OF CARE: Admit to inpatient   PATIENT DISPOSITION:  PACU - hemodynamically stable.   Delay start of Pharmacological VTE agent (>24hrs) due to surgical blood loss or risk of bleeding: no

## 2018-02-16 NOTE — Anesthesia Postprocedure Evaluation (Signed)
Anesthesia Post Note  Patient: Darlene Mccormick  Procedure(s) Performed: ABDOMINAL MYOMECTOMY (N/A )     Patient location during evaluation: PACU Anesthesia Type: General Level of consciousness: awake and alert Pain management: pain level controlled Vital Signs Assessment: post-procedure vital signs reviewed and stable Respiratory status: spontaneous breathing, nonlabored ventilation and respiratory function stable Cardiovascular status: blood pressure returned to baseline and stable Postop Assessment: no apparent nausea or vomiting Anesthetic complications: no    Last Vitals:  Vitals:   02/16/18 1215 02/16/18 1230  BP: (!) 139/93 (!) 149/93  Pulse: (!) 101 97  Resp: 13 16  Temp: 37.3 C   SpO2: 99% 99%    Last Pain:  Vitals:   02/16/18 1215  TempSrc:   PainSc: 0-No pain   Pain Goal: Patients Stated Pain Goal: 4 (02/16/18 1215)               Brennan Bailey

## 2018-02-16 NOTE — Transfer of Care (Signed)
Immediate Anesthesia Transfer of Care Note  Patient: Darlene Mccormick  Procedure(s) Performed: ABDOMINAL MYOMECTOMY (N/A )  Patient Location: PACU  Anesthesia Type:General  Level of Consciousness: awake, alert  and oriented  Airway & Oxygen Therapy: Patient Spontanous Breathing and Patient connected to nasal cannula oxygen  Post-op Assessment: Report given to RN and Post -op Vital signs reviewed and stable  Post vital signs: Reviewed and stable  Last Vitals:  Vitals Value Taken Time  BP 139/93 02/16/2018 12:15 PM  Temp    Pulse 102 02/16/2018 12:16 PM  Resp 13 02/16/2018 12:16 PM  SpO2 96 % 02/16/2018 12:16 PM  Vitals shown include unvalidated device data.  Last Pain: 0 Vitals:   02/16/18 0731  TempSrc: Oral      Patients Stated Pain Goal: 4 (53/97/67 3419)  Complications: No apparent anesthesia complications

## 2018-02-17 ENCOUNTER — Encounter (HOSPITAL_COMMUNITY): Payer: Self-pay | Admitting: Obstetrics and Gynecology

## 2018-02-17 LAB — CBC
HEMATOCRIT: 31 % — AB (ref 36.0–46.0)
Hemoglobin: 10.2 g/dL — ABNORMAL LOW (ref 12.0–15.0)
MCH: 29.7 pg (ref 26.0–34.0)
MCHC: 32.9 g/dL (ref 30.0–36.0)
MCV: 90.1 fL (ref 80.0–100.0)
NRBC: 0 % (ref 0.0–0.2)
PLATELETS: 253 10*3/uL (ref 150–400)
RBC: 3.44 MIL/uL — ABNORMAL LOW (ref 3.87–5.11)
RDW: 12.7 % (ref 11.5–15.5)
WBC: 8.9 10*3/uL (ref 4.0–10.5)

## 2018-02-17 MED ORDER — KETOROLAC TROMETHAMINE 30 MG/ML IJ SOLN: 30.0000 mg | Freq: Four times a day (QID) | INTRAMUSCULAR | Status: DC

## 2018-02-17 MED ORDER — KETOROLAC TROMETHAMINE 30 MG/ML IJ SOLN
30.0000 mg | Freq: Four times a day (QID) | INTRAMUSCULAR | Status: DC
  Administered 2018-02-17: 30 mg via INTRAVENOUS
  Filled 2018-02-17: qty 1

## 2018-02-17 NOTE — Anesthesia Postprocedure Evaluation (Signed)
Anesthesia Post Note  Patient: Darlene Mccormick  Procedure(s) Performed: ABDOMINAL MYOMECTOMY (N/A )     Patient location during evaluation: [facility] Anesthesia Type: General Level of consciousness: awake and alert Pain management: pain level controlled Vital Signs Assessment: post-procedure vital signs reviewed and stable Respiratory status: spontaneous breathing, nonlabored ventilation and respiratory function stable Cardiovascular status: stable Postop Assessment: no apparent nausea or vomiting, adequate PO intake and able to ambulate Anesthetic complications: no    Last Vitals:  Vitals:   02/17/18 0506 02/17/18 0745  BP: 131/77 132/81  Pulse: 84 86  Resp: 16 18  Temp: 37.2 C 36.7 C  SpO2: 97% 98%    Last Pain:  Vitals:   02/17/18 0745  TempSrc: Oral  PainSc:    Pain Goal: Patients Stated Pain Goal: 3 (02/16/18 1431)               Darlene Mccormick

## 2018-02-17 NOTE — Addendum Note (Signed)
Addendum  created 02/17/18 0818 by Hewitt Blade, CRNA   Sign clinical note

## 2018-02-17 NOTE — Progress Notes (Signed)
1 Day Post-Op Procedure(s) (LRB): ABDOMINAL MYOMECTOMY (N/A)  Subjective: Patient reports +flatus.  Pain controlled.Ambulating in halls without issue.  Tolerating a regular diet  Objective: I have reviewed patient's vital signs and intake and output.  Temp:  [97.8 F (36.6 C)-99.5 F (37.5 C)] 98.1 F (36.7 C) (11/21 0745) Pulse Rate:  [84-106] 86 (11/21 0745) Resp:  [13-21] 18 (11/21 0745) BP: (129-152)/(73-97) 132/81 (11/21 0745) SpO2:  [96 %-99 %] 98 % (11/21 0745)   I/O last 3 completed shifts: In: 3000 [I.V.:3000] Out: 3225 [Urine:2675; Blood:550] No intake/output data recorded.    Gen:  Nad Abd:  Dressing is clean Pressure dressing still in place, soft, nontender  Assessment: s/p Procedure(s): ABDOMINAL MYOMECTOMY (N/A): progressing well Pain managed with Exparel and Toradol IV.  Plan: Discontinue Toradol.  Consider discharge this evening.  LOS: 1 day    Thurnell Lose 02/17/2018, 8:28 AM

## 2018-02-18 MED ORDER — IBUPROFEN 800 MG PO TABS: 800.0000 mg | ORAL_TABLET | Freq: Three times a day (TID) | ORAL | 1 refills | Status: DC | PRN

## 2018-02-18 MED ORDER — TRAMADOL HCL 50 MG PO TABS: 50.0000 mg | ORAL_TABLET | Freq: Four times a day (QID) | ORAL | 0 refills | Status: DC | PRN

## 2018-02-18 NOTE — Discharge Instructions (Signed)
Myomectomy, Care After °Refer to this sheet in the next few weeks. These instructions provide you with information on caring for yourself after your procedure. Your health care provider may also give you more specific instructions. Your treatment has been planned according to current medical practices, but problems sometimes occur. Call your health care provider if you have any problems or questions after your procedure. °What can I expect after the procedure? °After your procedure, it is typical to have the following: °· Pain in your abdomen, especially at any incision sites. You will be given pain medicine to control the pain. °· Tiredness. This is a normal part of the recovery process. Your energy level will return to normal over the next several weeks. °· Constipation. °· Vaginal bleeding. This is normal and should stop after 1-2 weeks. ° °Follow these instructions at home: °· Only take over-the-counter or prescription medicines as directed by your health care provider. Avoid aspirin because it can cause bleeding. °· Do not douche, use tampons, or have sexual intercourse until given permission by your health care provider. °· Remove or change any bandages (dressings) as directed by your health care provider. °· Take showers instead of baths as directed by your health care provider. °· You will probably be able to go back to your normal routine after a few days. Do not do anything that requires extra effort until your health care provider says it is okay. Do not lift anything heavier than 15 pounds (6.8 kg) until your health care provider approves. °· Walk daily but take frequent rest breaks if you tire easily. °· Continue to practice deep breathing and coughing. If it hurts to cough, try holding a pillow against your belly as you cough. °· If you become constipated, you may: °? Use a mild laxative if your health care provider approves. °? Add more fruit and bran to your diet. °? Drink enough fluids to keep your  urine clear or pale yellow. °· Take your temperature twice a day and write it down. °· Do not drink alcohol. °· Do not drive until your health care provider approves. °· Have someone help you at home for 1 week or until you can do your own household activities. °· Follow up with your health care provider as directed. °Contact a health care provider if: °· You have a fever. °· You have increasing abdominal pain that is not relieved with medicine. °· You have nausea, vomiting, or diarrhea. °· You have pain when you urinate, or you have blood in your urine. °· You have a rash on your body. °· You have pain or redness where your IV access tube was inserted. °· You have redness, swelling, or any kind of drainage from an incision. °Get help right away if: °· You have weakness or lightheadedness. °· You have pain, swelling, or redness in your legs. °· You have chest pain. °· You faint. °· You have shortness of breath. °· You have heavy vaginal bleeding. °· Your incision is opening up. °This information is not intended to replace advice given to you by your health care provider. Make sure you discuss any questions you have with your health care provider. °Document Released: 08/06/2010 Document Revised: 08/22/2015 Document Reviewed: 10/26/2012 °Elsevier Interactive Patient Education © 2017 [employer] ° °

## 2018-02-18 NOTE — Discharge Summary (Signed)
Physician Discharge Summary  Patient ID: Darlene Mccormick MRN: [id number] DOB/AGE: [birth year] 42 y.o.  Admit date: 02/16/2018 Discharge date: 02/18/2018  Admission Diagnoses:  Discharge Diagnoses:  Active Problems:   Fibroids   S/P myomectomy   Discharged Condition: good  Hospital Course: Post op course was uncomplicated  Consults: None  Significant Diagnostic Studies: labs: see below.  CBC Latest Ref Rng & Units 02/17/2018 02/16/2018 02/09/2018  WBC 4.0 - 10.5 K/uL 8.9 7.5 6.1  Hemoglobin 12.0 - 15.0 g/dL 10.2(L) 13.4 13.2  Hematocrit 36.0 - 46.0 % 31.0(L) 40.2 39.3  Platelets 150 - 400 K/uL 253 340 362    Treatments: surgery: open myomectomy  Discharge Exam: Blood pressure 124/81, pulse 79, temperature 98.3 F (36.8 C), temperature source Oral, resp. rate 18, height 5' 8.5" (1.74 m), weight 118.2 kg, last menstrual period 02/01/2018, SpO2 98 %. GEN:  NAD  ABd:  Soft, appropriately tender.  Dressing minimally soiled  Disposition: Discharge disposition: 01-Home or Self Care       Discharge Instructions     Remove dressing in 72 hours   Complete by:  As directed    Call MD for:  extreme fatigue   Complete by:  As directed    Call MD for:  persistant dizziness or light-headedness   Complete by:  As directed    Call MD for:  persistant nausea and vomiting   Complete by:  As directed    Call MD for:  redness, tenderness, or signs of infection (pain, swelling, redness, odor or green/yellow discharge around incision site)   Complete by:  As directed    Call MD for:  severe uncontrolled pain   Complete by:  As directed    Call MD for:  temperature >100.4   Complete by:  As directed    Diet - low sodium heart healthy   Complete by:  As directed    Increase activity slowly   Complete by:  As directed    Lifting restrictions   Complete by:  As directed    No heavy lifting greater than 15 pounds.     Allergies as of 02/18/2018   No Known Allergies     Medication List    TAKE these medications   Ciclopirox 0.77 % gel Apply 1 application topically daily as needed (fungus).   ibuprofen 800 MG tablet Commonly known as:  ADVIL,MOTRIN Take 1 tablet (800 mg total) by mouth every 8 (eight) hours as needed (mild pain).   Magnesium 500 MG Caps Take 500 mg by mouth daily.   MILK THISTLE PO Take 375 mg by mouth daily.   multivitamin with minerals Tabs tablet Take 1 tablet by mouth daily.   traMADol 50 MG tablet Commonly known as:  ULTRAM Take 1 tablet (50 mg total) by mouth every 6 (six) hours as needed for moderate pain.      Follow-up Information    Thurnell Lose, MD. Schedule an appointment as soon as possible for a visit in 2 week(s).   Specialty:  Obstetrics and Gynecology Why:  Post op check up Contact information: [address] [place] [address] [phone]           Signed: Thurnell Lose 02/18/2018, 9:03 AM

## 2018-03-02 DIAGNOSIS — Z48816 Encounter for surgical aftercare following surgery on the genitourinary system: Secondary | ICD-10-CM | POA: Diagnosis not present

## 2018-03-04 ENCOUNTER — Other Ambulatory Visit: Payer: Self-pay | Admitting: Obstetrics and Gynecology

## 2018-03-04 DIAGNOSIS — Z1231 Encounter for screening mammogram for malignant neoplasm of breast: Secondary | ICD-10-CM

## 2018-03-10 ENCOUNTER — Ambulatory Visit
Admission: RE | Admit: 2018-03-10 | Discharge: 2018-03-10 | Disposition: A | Discharge status: Home / Self Care | Payer: 59 | Source: Ambulatory Visit | Attending: Obstetrics and Gynecology | Admitting: Obstetrics and Gynecology

## 2018-03-10 DIAGNOSIS — Z1231 Encounter for screening mammogram for malignant neoplasm of breast: Secondary | ICD-10-CM

## 2018-03-14 ENCOUNTER — Other Ambulatory Visit: Payer: Self-pay | Admitting: Obstetrics and Gynecology

## 2018-03-14 DIAGNOSIS — R928 Other abnormal and inconclusive findings on diagnostic imaging of breast: Secondary | ICD-10-CM

## 2018-03-18 ENCOUNTER — Ambulatory Visit
Admission: RE | Admit: 2018-03-18 | Discharge: 2018-03-18 | Disposition: A | Discharge status: Home / Self Care | Payer: 59 | Source: Ambulatory Visit | Attending: Obstetrics and Gynecology | Admitting: Obstetrics and Gynecology

## 2018-03-18 ENCOUNTER — Other Ambulatory Visit: Payer: Self-pay | Admitting: Obstetrics and Gynecology

## 2018-03-18 DIAGNOSIS — R928 Other abnormal and inconclusive findings on diagnostic imaging of breast: Secondary | ICD-10-CM | POA: Diagnosis not present

## 2018-03-18 DIAGNOSIS — N6001 Solitary cyst of right breast: Secondary | ICD-10-CM

## 2018-03-18 DIAGNOSIS — N6489 Other specified disorders of breast: Secondary | ICD-10-CM | POA: Diagnosis not present

## 2018-09-06 ENCOUNTER — Ambulatory Visit: Payer: Self-pay | Admitting: *Deleted

## 2018-09-06 ENCOUNTER — Ambulatory Visit (INDEPENDENT_AMBULATORY_CARE_PROVIDER_SITE_OTHER): Payer: 59 | Admitting: Family Medicine

## 2018-09-06 ENCOUNTER — Other Ambulatory Visit: Payer: Self-pay

## 2018-09-06 VITALS — BP 120/80 | HR 64 | Temp 97.8°F | Wt 266.7 lb

## 2018-09-06 DIAGNOSIS — R0789 Other chest pain: Secondary | ICD-10-CM

## 2018-09-06 NOTE — Patient Instructions (Signed)

## 2018-09-06 NOTE — Progress Notes (Signed)
Subjective:     Patient ID: Darlene Mccormick, female   DOB: [birth year], 43 y.o.   MRN: [id number]  HPI Patient is seen with basically 1 day history of some atypical chest pain.  She states that Sunday night she and her husband ate some [place] food.  By Monday she had some chest tightness left side of chest while she was at work.  This lasted very briefly and she estimated less than a minute.  She had a little bit of possible radiation to the left neck area.  She noticed a bit of tingling sensation in her fingers.  No obvious GERD symptoms.  No recent cough.  No pleuritic pain.  No exertional symptoms.  She walks her dog daily and has never had any issues with that.  Symptoms seem to be improved after stretching.  No upper extremity weakness.  Patient is non-smoker.  No history of diabetes.  No hypertension.  Does have history of hyperlipidemia which is untreated.  No family history of premature CAD.  She had paternal grandmother that had coronary disease in her 55s but her parents have not had any issues.  Past Medical History:  Diagnosis Date  . Anxiety   . Elevated liver enzymes   . Fatty liver   . Gallstones   . Hyperlipidemia   . Hypertension    not recently   Past Surgical History:  Procedure Laterality Date  . APPENDECTOMY  1997  . MYOMECTOMY N/A 02/16/2018   Procedure: ABDOMINAL MYOMECTOMY;  Surgeon: Thurnell Lose, MD;  Location: [facility];  Service: Gynecology;  Laterality: N/A;  . TONSILLECTOMY AND ADENOIDECTOMY  1988  . TRACHEOSTOMY  1979   done during epiglotitis    reports that she has never smoked. She has never used smokeless tobacco. She reports current alcohol use of about 1.0 standard drinks of alcohol per week. She reports that she does not use drugs. family history includes Alcohol abuse in her maternal grandfather; Breast cancer in her maternal grandmother and another family member; Diabetes in her mother; Heart attack (age of onset: 75) in her paternal  grandmother; High blood pressure in her brother; Hyperlipidemia in her father; Hypertension in her father; Other in her mother. No Known Allergies   Review of Systems  Constitutional: Negative for appetite change, chills, fever and unexpected weight change.  Respiratory: Negative for shortness of breath.   Cardiovascular: Positive for chest pain. Negative for palpitations and leg swelling.  Gastrointestinal: Negative for abdominal pain.  Neurological: Negative for dizziness and syncope.       Objective:   Physical Exam Constitutional:      Appearance: She is well-developed.  Cardiovascular:     Rate and Rhythm: Normal rate and regular rhythm.     Heart sounds: Normal heart sounds. No systolic murmur.  Pulmonary:     Effort: Pulmonary effort is normal. No respiratory distress.     Breath sounds: Normal breath sounds.  Abdominal:     Palpations: Abdomen is soft. There is no mass.     Tenderness: There is no abdominal tenderness. There is no guarding or rebound.  Neurological:     Mental Status: She is alert.        Assessment:     Atypical chest pain which is very brief duration lasting less than a minute and nonexertional.  Fairly low risk for CAD other than hyperlipidemia hx.  Low suspicion for symptomatic gallstones based on location of pain and symptoms.    Plan:     -  Check EKG-sinus rhythm with no acute changes compared to prior tracing -We recommended observation for now.  Follow-up immediately if she has any exertional symptoms, progressive symptoms, or other concern.  Eulas Post MD [facility] at [facility]

## 2018-09-06 NOTE — Telephone Encounter (Signed)
Patient is being seen in office

## 2018-09-06 NOTE — Telephone Encounter (Signed)
Calls with chest pressure in between breast she describes as a short pinch that comes and goes, not related to activity. Feels ache in her left shoulder and a stiff neck at times with the chest pressure. This began yesterday while working from home. She has experience tingling in her left hand occasionally. Has taken ASA 81 mg last night and today. Resting today.Reports high cholesterol not on any medications.Denies sweating/vision changes. Has facial symmetry no drifting arms no changes in speech. Feels the left side of her body is not quite normal. She has had reaction to drinking caffeine in the past. She accidentally drank a caffeine drink yesterday morning and thinks this could be related. Sh reports having gallstones. No travels/no known exposures. Reason for Disposition . [1] Chest pain lasting <= 5 minutes AND [2] NO chest pain or cardiac symptoms now(Exceptions: pains lasting a few seconds)  Answer Assessment - Initial Assessment Questions 1. LOCATION: "Where does it hurt?"      Chest pressure center located. 2. RADIATION: "Does the pain go anywhere else?" (e.g., into neck, jaw, arms, back)     Feels neck is stiff and left shoulder aches 3. ONSET: "When did the chest pain begin?" (Minutes, hours or days)     Yesterday.  4. PATTERN "Does the pain come and go, or has it been constant since it started?"  "Does it get worse with exertion?"     Comes and goes, doesn't change with activity 5. DURATION: "How long does it last" (e.g., seconds, minutes, hours)     Few seconds. 6. SEVERITY: "How bad is the pain?"  (e.g., Scale 1-10; mild, moderate, or severe)    - MILD (1-3): doesn't interfere with normal activities     - MODERATE (4-7): interferes with normal activities or awakens from sleep    - SEVERE (8-10): excruciating pain, unable to do any normal activities       2 7. CARDIAC RISK FACTORS: "Do you have any history of heart problems or risk factors for heart disease?" (e.g., prior heart  attack, angina; high blood pressure, diabetes, being overweight, high cholesterol, smoking, or strong family history of heart disease)     High cholesterol. GM had heart attack in their late 70's. 8. PULMONARY RISK FACTORS: "Do you have any history of lung disease?"  (e.g., blood clots in lung, asthma, emphysema, birth control pills)     no 9. CAUSE: "What do you think is causing the chest pain?"     unsure 10. OTHER SYMPTOMS: "Do you have any other symptoms?" (e.g., dizziness, nausea, vomiting, sweating, fever, difficulty breathing, cough)       Headache yesterday and today feeling lightheaded and trouble focusing. 11. PREGNANCY: "Is there any chance you are pregnant?" "When was your last menstrual period?"       no  Protocols used: CHEST PAIN-A-AH

## 2018-09-06 NOTE — Telephone Encounter (Signed)
Wrong office

## 2018-09-19 ENCOUNTER — Other Ambulatory Visit: Payer: Self-pay | Admitting: Obstetrics and Gynecology

## 2018-09-19 ENCOUNTER — Ambulatory Visit
Admission: RE | Admit: 2018-09-19 | Discharge: 2018-09-19 | Disposition: A | Discharge status: Home / Self Care | Payer: 59 | Source: Ambulatory Visit | Attending: Obstetrics and Gynecology | Admitting: Obstetrics and Gynecology

## 2018-09-19 ENCOUNTER — Other Ambulatory Visit: Payer: Self-pay

## 2018-09-19 DIAGNOSIS — N63 Unspecified lump in unspecified breast: Secondary | ICD-10-CM

## 2018-09-19 DIAGNOSIS — N6001 Solitary cyst of right breast: Secondary | ICD-10-CM

## 2018-11-07 ENCOUNTER — Other Ambulatory Visit: Payer: Self-pay

## 2018-11-07 DIAGNOSIS — Z20822 Contact with and (suspected) exposure to covid-19: Secondary | ICD-10-CM

## 2018-11-08 LAB — NOVEL CORONAVIRUS, NAA: SARS-CoV-2, NAA: NOT DETECTED

## 2018-11-24 ENCOUNTER — Telehealth: Payer: Self-pay | Admitting: *Deleted

## 2018-11-24 NOTE — Telephone Encounter (Signed)
Copied from [place] [id number]. Topic: General - Other >> Nov 24, 2018 11:03 AM Darlene Mccormick wrote: Reason for CRM: pt called in to schedule a visit for bio-screening. Pt says that she has to have this completed for work. Pt would like to know if provider is able to work her in?   Please assist.    CB: [phone]

## 2018-11-24 NOTE — Telephone Encounter (Signed)
Patient is calling to see if she can get blood work ordered. CBC, height weight, blood pressure. For her employer.  Patient is requesting this by 11/29/2018.  Spoke with Tammy. Tammy stated that the patient would need a CPE. And that Dr. Ethlyn Gallery had no availability.  Recommended to tell the patient to go to Urgent Care to get it completely quickly.  Thanks  [phone]

## 2018-11-25 ENCOUNTER — Encounter: Payer: Self-pay | Admitting: Family Medicine

## 2018-11-25 ENCOUNTER — Ambulatory Visit (INDEPENDENT_AMBULATORY_CARE_PROVIDER_SITE_OTHER): Payer: 59 | Admitting: Family Medicine

## 2018-11-25 ENCOUNTER — Other Ambulatory Visit: Payer: Self-pay

## 2018-11-25 VITALS — BP 122/82 | HR 80 | Temp 97.6°F | Ht 68.0 in | Wt 269.4 lb

## 2018-11-25 DIAGNOSIS — R5383 Other fatigue: Secondary | ICD-10-CM | POA: Diagnosis not present

## 2018-11-25 DIAGNOSIS — Z0000 Encounter for general adult medical examination without abnormal findings: Secondary | ICD-10-CM

## 2018-11-25 DIAGNOSIS — D229 Melanocytic nevi, unspecified: Secondary | ICD-10-CM

## 2018-11-25 DIAGNOSIS — B353 Tinea pedis: Secondary | ICD-10-CM

## 2018-11-25 DIAGNOSIS — Z23 Encounter for immunization: Secondary | ICD-10-CM | POA: Diagnosis not present

## 2018-11-25 DIAGNOSIS — E782 Mixed hyperlipidemia: Secondary | ICD-10-CM | POA: Diagnosis not present

## 2018-11-25 DIAGNOSIS — R748 Abnormal levels of other serum enzymes: Secondary | ICD-10-CM

## 2018-11-25 MED ORDER — CICLOPIROX 0.77 % EX GEL: 1.0000 "application " | Freq: Two times a day (BID) | CUTANEOUS | 1 refills | Status: AC

## 2018-11-25 NOTE — Telephone Encounter (Signed)
She was added on to schedule today.

## 2018-11-25 NOTE — Addendum Note (Signed)
Addended by: Caren Macadam on: 11/25/2018 05:07 PM   Modules accepted: Orders

## 2018-11-25 NOTE — Addendum Note (Signed)
Addended by: Elmer Picker on: 11/25/2018 03:54 PM   Modules accepted: Orders

## 2018-11-25 NOTE — Addendum Note (Signed)
Addended by: Denna Haggard K on: 11/25/2018 04:00 PM   Modules accepted: Orders

## 2018-11-25 NOTE — Addendum Note (Signed)
Addended by: Agnes Lawrence on: 11/25/2018 04:41 PM   Modules accepted: Orders

## 2018-11-25 NOTE — Progress Notes (Addendum)
Darlene Mccormick DOB: 07/14/1975 Encounter date: 11/25/2018  This is a 43 y.o. female who presents for complete physical   History of present illness/Additional concerns: Doing well. Has been remote (works at [employer]). Has been working from home during all of [place].   No specific health concerns today. Generally energy level is ok. Sometimes having hard time with focus. Has 4-5 zoom meetings/day. Wears "blue light glasses" while on computer. Walking dog at least 1 mile/day. Not swimming right now.   No spotting/periods since myomectomy.   Has bp checked regularly at chiropractor - usually 122/81 or lower.   Saw Dr. Elease Hashimoto in June for atypical chest pain. Has not recurred since then. Wonders if related to anxiety or gallstones.   Sees chiropractor once monthly. Dr. Ronalee Belts.   Sees therapist twice a month just to check in for anxiety.   Has seen dermatology specialists on [place]. Hasn't seen them in 2 years.   Past Medical History:  Diagnosis Date  . Anxiety   . Elevated liver enzymes   . Fatty liver   . Gallstones   . Hyperlipidemia   . Hypertension    not recently   Past Surgical History:  Procedure Laterality Date  . APPENDECTOMY  1997  . MYOMECTOMY N/A 02/16/2018   Procedure: ABDOMINAL MYOMECTOMY;  Surgeon: Thurnell Lose, MD;  Location: [facility];  Service: Gynecology;  Laterality: N/A;  . TONSILLECTOMY AND ADENOIDECTOMY  1988  . TRACHEOSTOMY  1979   done during epiglotitis   No Known Allergies Current Meds  Medication Sig  . Magnesium 500 MG CAPS Take 500 mg by mouth daily.  Marland Kitchen MILK THISTLE PO Take 375 mg by mouth daily.  . Multiple Vitamin (MULTIVITAMIN WITH MINERALS) TABS tablet Take 1 tablet by mouth daily.   Social History   Tobacco Use  . Smoking status: Never Smoker  . Smokeless tobacco: Never Used  Substance Use Topics  . Alcohol use: Yes    Alcohol/week: 1.0 standard drinks    Types: 1 Glasses of wine per week   Family History   Problem Relation Age of Onset  . Diabetes Mother   . Other Mother        traumatic brain injury  . Hyperlipidemia Father   . Hypertension Father   . Breast cancer Maternal Grandmother   . Alcohol abuse Maternal Grandfather   . High blood pressure Brother   . Breast cancer Other   . Heart attack Paternal Grandmother 46     Review of Systems  Constitutional: Positive for fatigue. Negative for activity change, appetite change, chills, fever and unexpected weight change.  HENT: Negative for congestion, ear pain, hearing loss, sinus pressure, sinus pain, sore throat and trouble swallowing.   Eyes: Negative for pain and visual disturbance.  Respiratory: Negative for cough, chest tightness, shortness of breath and wheezing.   Cardiovascular: Negative for chest pain, palpitations and leg swelling.  Gastrointestinal: Negative for abdominal pain, blood in stool, constipation, diarrhea, nausea and vomiting.  Genitourinary: Negative for difficulty urinating and menstrual problem.  Musculoskeletal: Negative for arthralgias and back pain.  Skin: Negative for rash.  Neurological: Negative for dizziness, weakness, numbness and headaches.  Hematological: Negative for adenopathy. Does not bruise/bleed easily.  Psychiatric/Behavioral: Negative for sleep disturbance and suicidal ideas. The patient is not nervous/anxious.     CBC:  Lab Results  Component Value Date   WBC 8.9 02/17/2018   HGB 10.2 (L) 02/17/2018   HCT 31.0 (L) 02/17/2018   MCH  29.7 02/17/2018   MCHC 32.9 02/17/2018   RDW 12.7 02/17/2018   PLT 253 02/17/2018   CMP: Lab Results  Component Value Date   NA 137 02/16/2018   NA 140 04/22/2016   K 4.1 02/16/2018   CL 104 02/16/2018   CO2 22 02/16/2018   ANIONGAP 11 02/16/2018   GLUCOSE 121 (H) 02/16/2018   BUN 10 02/16/2018   BUN 8 04/22/2016   CREATININE 0.65 02/16/2018   GFRAA >60 02/16/2018   CALCIUM 8.9 02/16/2018   PROT 7.7 02/16/2018   BILITOT 1.5 (H) 02/16/2018    ALKPHOS 57 02/16/2018   ALT 69 (H) 02/16/2018   AST 42 (H) 02/16/2018   LIPID: Lab Results  Component Value Date   CHOL 242 (H) 11/26/2017   TRIG 331.0 (H) 11/26/2017   HDL 37.70 (L) 11/26/2017   LDLCALC 128 04/22/2016    Objective:  BP 122/82 (BP Location: Left Arm, Patient Position: Sitting, Cuff Size: Large)   Pulse 80   Temp 97.6 F (36.4 C) (Temporal)   Ht 5\' 8"  (1.727 m)   Wt 269 lb 6.4 oz (122.2 kg)   LMP 11/02/2018 (Exact Date)   SpO2 98%   BMI 40.96 kg/m   Weight: 269 lb 6.4 oz (122.2 kg)   BP Readings from Last 3 Encounters:  11/25/18 122/82  09/06/18 120/80  02/18/18 124/81   Wt Readings from Last 3 Encounters:  11/25/18 269 lb 6.4 oz (122.2 kg)  09/06/18 266 lb 11.2 oz (121 kg)  02/16/18 260 lb 8 oz (118.2 kg)    Physical Exam Constitutional:      General: She is not in acute distress.    Appearance: She is well-developed.  HENT:     Head: Normocephalic and atraumatic.     Right Ear: External ear normal.     Left Ear: External ear normal.     Mouth/Throat:     Pharynx: No oropharyngeal exudate.  Eyes:     Conjunctiva/sclera: Conjunctivae normal.     Pupils: Pupils are equal, round, and reactive to light.  Neck:     Musculoskeletal: Normal range of motion and neck supple.     Thyroid: No thyromegaly.  Cardiovascular:     Rate and Rhythm: Normal rate and regular rhythm.     Heart sounds: Normal heart sounds. No murmur. No friction rub. No gallop.   Pulmonary:     Effort: Pulmonary effort is normal.     Breath sounds: Normal breath sounds.  Abdominal:     General: Bowel sounds are normal. There is no distension.     Palpations: Abdomen is soft. There is no mass.     Tenderness: There is no abdominal tenderness. There is no guarding.     Hernia: No hernia is present.  Musculoskeletal: Normal range of motion.        General: No tenderness or deformity.  Lymphadenopathy:     Cervical: No cervical adenopathy.  Skin:    General: Skin is warm  and dry.     Findings: No rash.     Comments: Has mole upper center back that is dark, irregular. Left foot - scaling/peeling bottom/sides. States that in past this resolved with topical antifungal.  Neurological:     Mental Status: She is alert and oriented to person, place, and time.     Deep Tendon Reflexes: Reflexes normal.     Reflex Scores:      Tricep reflexes are 2+ on the right side and 2+ on  the left side.      Bicep reflexes are 2+ on the right side and 2+ on the left side.      Brachioradialis reflexes are 2+ on the right side and 2+ on the left side.      Patellar reflexes are 2+ on the right side and 2+ on the left side. Psychiatric:        Speech: Speech normal.        Behavior: Behavior normal.        Thought Content: Thought content normal.     Assessment/Plan: Health Maintenance Due  Topic Date Due  . TETANUS/TDAP  03/15/1996  . INFLUENZA VACCINE  10/29/2018   Health Maintenance reviewed and up to date.  1. Preventative health care Up to date with mammogram. Keep working on challenging self.   2. Other fatigue Start with bloodwork and consider follow up pending this.  - CBC with Differential/Platelet; Future - Comprehensive metabolic panel; Future - Vitamin B12; Future - TSH; Future - VITAMIN D 25 Hydroxy (Vit-D Deficiency, Fractures); Future  3. Hyperlipidemia, mixed - Lipid panel; Future  4. Morbid obesity ([place]) She is going to work on cutting back carbs, higher intensity exercise. She will return in 3 months time for weight recheck and to discuss options for weight loss if needed.   5. Skin moles - she will call and set up follow up with dermatology.  6. Tinea pedis - resolved with ciclopirox in past; will refill for her.   Return in about 3 months (around 02/25/2019) for Chronic condition visit.  Micheline Rough, MD

## 2018-11-26 LAB — CBC WITH DIFFERENTIAL/PLATELET
Absolute Monocytes: 600 cells/uL (ref 200–950)
Basophils Absolute: 30 cells/uL (ref 0–200)
Basophils Relative: 0.4 %
Eosinophils Absolute: 190 cells/uL (ref 15–500)
Eosinophils Relative: 2.5 %
HCT: 37.9 % (ref 35.0–45.0)
Hemoglobin: 12.9 g/dL (ref 11.7–15.5)
Lymphs Abs: 2364 cells/uL (ref 850–3900)
MCH: 29.7 pg (ref 27.0–33.0)
MCHC: 34 g/dL (ref 32.0–36.0)
MCV: 87.3 fL (ref 80.0–100.0)
MPV: 8.9 fL (ref 7.5–12.5)
Monocytes Relative: 7.9 %
Neutro Abs: 4416 cells/uL (ref 1500–7800)
Neutrophils Relative %: 58.1 %
Platelets: 377 10*3/uL (ref 140–400)
RBC: 4.34 10*6/uL (ref 3.80–5.10)
RDW: 12.9 % (ref 11.0–15.0)
Total Lymphocyte: 31.1 %
WBC: 7.6 10*3/uL (ref 3.8–10.8)

## 2018-11-26 LAB — COMPREHENSIVE METABOLIC PANEL
AG Ratio: 1.8 (calc) (ref 1.0–2.5)
ALT: 174 U/L — ABNORMAL HIGH (ref 6–29)
AST: 97 U/L — ABNORMAL HIGH (ref 10–30)
Albumin: 4.6 g/dL (ref 3.6–5.1)
Alkaline phosphatase (APISO): 62 U/L (ref 31–125)
BUN: 9 mg/dL (ref 7–25)
CO2: 23 mmol/L (ref 20–32)
Calcium: 9.4 mg/dL (ref 8.6–10.2)
Chloride: 101 mmol/L (ref 98–110)
Creat: 0.66 mg/dL (ref 0.50–1.10)
Globulin: 2.6 g/dL (calc) (ref 1.9–3.7)
Glucose, Bld: 88 mg/dL (ref 65–99)
Potassium: 4.4 mmol/L (ref 3.5–5.3)
Sodium: 137 mmol/L (ref 135–146)
Total Bilirubin: 1.5 mg/dL — ABNORMAL HIGH (ref 0.2–1.2)
Total Protein: 7.2 g/dL (ref 6.1–8.1)

## 2018-11-26 LAB — LIPID PANEL
Cholesterol: 279 mg/dL — ABNORMAL HIGH (ref <200)
HDL: 40 mg/dL — ABNORMAL LOW (ref >=50)
LDL Cholesterol (Calc): 193 mg/dL (calc) — ABNORMAL HIGH
Non-HDL Cholesterol (Calc): 239 mg/dL (calc) — ABNORMAL HIGH (ref <130)
Total CHOL/HDL Ratio: 7 (calc) — ABNORMAL HIGH (ref <5.0)
Triglycerides: 256 mg/dL — ABNORMAL HIGH (ref <150)

## 2018-11-26 LAB — VITAMIN D 25 HYDROXY (VIT D DEFICIENCY, FRACTURES): Vit D, 25-Hydroxy: 22 ng/mL — ABNORMAL LOW (ref 30–100)

## 2018-11-26 LAB — VITAMIN B12: Vitamin B-12: 1090 pg/mL (ref 200–1100)

## 2018-11-26 LAB — TSH: TSH: 1.26 mIU/L

## 2018-12-01 ENCOUNTER — Encounter: Payer: Self-pay | Admitting: Gastroenterology

## 2018-12-01 ENCOUNTER — Encounter: Payer: Self-pay | Admitting: Family Medicine

## 2018-12-01 NOTE — Addendum Note (Signed)
Addended by: Agnes Lawrence on: 12/01/2018 10:56 AM   Modules accepted: Orders

## 2018-12-03 ENCOUNTER — Emergency Department (HOSPITAL_COMMUNITY)
Admission: EM | Admit: 2018-12-03 | Discharge: 2018-12-03 | Disposition: A | Discharge status: Home / Self Care | Payer: 59 | Attending: Emergency Medicine | Admitting: Emergency Medicine

## 2018-12-03 ENCOUNTER — Emergency Department (HOSPITAL_COMMUNITY): Payer: 59

## 2018-12-03 ENCOUNTER — Other Ambulatory Visit: Payer: Self-pay | Admitting: Family Medicine

## 2018-12-03 DIAGNOSIS — I10 Essential (primary) hypertension: Secondary | ICD-10-CM | POA: Insufficient documentation

## 2018-12-03 DIAGNOSIS — Y999 Unspecified external cause status: Secondary | ICD-10-CM | POA: Insufficient documentation

## 2018-12-03 DIAGNOSIS — Z79899 Other long term (current) drug therapy: Secondary | ICD-10-CM | POA: Diagnosis not present

## 2018-12-03 DIAGNOSIS — S2232XA Fracture of one rib, left side, initial encounter for closed fracture: Secondary | ICD-10-CM | POA: Insufficient documentation

## 2018-12-03 DIAGNOSIS — S299XXA Unspecified injury of thorax, initial encounter: Secondary | ICD-10-CM | POA: Diagnosis present

## 2018-12-03 DIAGNOSIS — X500XXA Overexertion from strenuous movement or load, initial encounter: Secondary | ICD-10-CM | POA: Insufficient documentation

## 2018-12-03 DIAGNOSIS — Y929 Unspecified place or not applicable: Secondary | ICD-10-CM | POA: Diagnosis not present

## 2018-12-03 DIAGNOSIS — Y9389 Activity, other specified: Secondary | ICD-10-CM | POA: Insufficient documentation

## 2018-12-03 DIAGNOSIS — R748 Abnormal levels of other serum enzymes: Secondary | ICD-10-CM

## 2018-12-03 MED ORDER — LIDOCAINE 5 % EX PTCH: 1.0000 | MEDICATED_PATCH | CUTANEOUS | 0 refills | Status: DC

## 2018-12-03 MED ORDER — OXYCODONE HCL 5 MG PO TABS
5.0000 mg | ORAL_TABLET | Freq: Once | ORAL | Status: AC
  Administered 2018-12-03: 12:00:00 5 mg via ORAL
  Filled 2018-12-03: qty 1

## 2018-12-03 MED ORDER — OXYCODONE HCL 5 MG PO CAPS: 5.0000 mg | ORAL_CAPSULE | Freq: Four times a day (QID) | ORAL | 0 refills | Status: DC | PRN

## 2018-12-03 MED ORDER — LIDOCAINE 5 % EX PTCH
1.0000 | MEDICATED_PATCH | CUTANEOUS | Status: DC
  Administered 2018-12-03: 1 via TRANSDERMAL
  Filled 2018-12-03: qty 1

## 2018-12-03 NOTE — ED Provider Notes (Signed)
[facility] EMERGENCY DEPARTMENT Provider Note   CSN: [id number] Arrival date & time: 12/03/18  [id number]     History   Chief Complaint No chief complaint on file.   HPI Darlene Mccormick is a 43 y.o. female presenting for evaluation of left side pain.  Patient states 4 days ago her husband was walking on her back with partial pressure when he had a little lower than normal, she felt something pop.  Since then, she has been having pain in her left anterior lower chest.  Pain is described as a dull soreness at rest, sharp pain with movement and inspiration.  She has been taking ibuprofen and aspirin without significant improvement of symptoms.  She is not sure anything else.  She has fevers, chills, cough, shortness of breath.  She denies nausea, vomiting, abdominal pain.  She has a history of elevated LFTs, hyperlipidemia, anxiety, she is not on any prescribed medications daily.  She is not on blood thinners.     HPI  Past Medical History:  Diagnosis Date  . Anxiety   . Elevated liver enzymes   . Fatty liver   . Gallstones   . Hyperlipidemia   . Hypertension    not recently    Patient Active Problem List   Diagnosis Date Noted  . Fibroids 02/16/2018  . S/P myomectomy 02/16/2018  . Vitamin D deficiency 06/10/2016  . GAD (generalized anxiety disorder) 06/10/2016  . Hyperlipidemia, mixed 06/03/2016  . Morbid obesity ([place]) 06/03/2016    Past Surgical History:  Procedure Laterality Date  . APPENDECTOMY  1997  . MYOMECTOMY N/A 02/16/2018   Procedure: ABDOMINAL MYOMECTOMY;  Surgeon: Thurnell Lose, MD;  Location: [facility];  Service: Gynecology;  Laterality: N/A;  . TONSILLECTOMY AND ADENOIDECTOMY  1988  . TRACHEOSTOMY  1979   done during epiglotitis     OB History   No obstetric history on file.      Home Medications    Prior to Admission medications   Medication Sig Start Date End Date Taking? Authorizing Provider  Ciclopirox 0.77 % gel Apply 1  application topically 2 (two) times daily for 14 days. 11/25/18 12/09/18  Koberlein, Steele Berg, MD  lidocaine (LIDODERM) 5 % Place 1 patch onto the skin daily. Remove & Discard patch within 12 hours or as directed by MD 12/03/18   Jisselle Poth, PA-C  Magnesium 500 MG CAPS Take 500 mg by mouth daily.    [provider]  MILK THISTLE PO Take 375 mg by mouth daily.    [provider]  Multiple Vitamin (MULTIVITAMIN WITH MINERALS) TABS tablet Take 1 tablet by mouth daily.    [provider]  oxycodone (OXY-IR) 5 MG capsule Take 1 capsule (5 mg total) by mouth every 6 (six) hours as needed. 12/03/18   Lavaughn Bisig, PA-C    Family History Family History  Problem Relation Age of Onset  . Diabetes Mother   . Other Mother        traumatic brain injury  . Hyperlipidemia Father   . Hypertension Father   . Breast cancer Maternal Grandmother   . Alcohol abuse Maternal Grandfather   . High blood pressure Brother   . Breast cancer Other   . Heart attack Paternal Grandmother 78    Social History Social History   Tobacco Use  . Smoking status: Never Smoker  . Smokeless tobacco: Never Used  Substance Use Topics  . Alcohol use: Yes    Alcohol/week: 1.0 standard  drinks    Types: 1 Glasses of wine per week  . Drug use: No     Allergies   Patient has no known allergies.   Review of Systems Review of Systems  Cardiovascular: Positive for chest pain.  All other systems reviewed and are negative.    Physical Exam Updated Vital Signs BP 121/74   Pulse 67   Temp 98.8 F (37.1 C) (Oral)   Resp 18   LMP 11/28/2018   SpO2 100%   Physical Exam Vitals signs and nursing note reviewed.  Constitutional:      General: She is not in acute distress.    Appearance: She is well-developed.     Comments: Obese female resting comfortably in the bed in no acute distress  HENT:     Head: Normocephalic and atraumatic.  Eyes:     Conjunctiva/sclera: Conjunctivae  normal.     Pupils: Pupils are equal, round, and reactive to light.  Neck:     Musculoskeletal: Normal range of motion and neck supple.  Cardiovascular:     Rate and Rhythm: Normal rate and regular rhythm.     Pulses: Normal pulses.  Pulmonary:     Effort: Pulmonary effort is normal. No respiratory distress.     Breath sounds: Normal breath sounds. No wheezing.  Chest:     Chest wall: Tenderness present.       Comments: Tenderness palpation of the left lower anterior chest wall.  Tenderness palpation the lateral or posterior chest.  No tenderness palpation on the right side or upper chest. Speaking in full sentences.  Clear lung sounds in all fields. Abdominal:     General: There is no distension.     Palpations: Abdomen is soft. There is no mass.     Tenderness: There is no abdominal tenderness. There is no guarding or rebound.     Comments: No tenderness palpation of the abdomen.  No bruising.  Soft that rigidity, guarding, distention.  Negative rebound.  Musculoskeletal: Normal range of motion.  Skin:    General: Skin is warm and dry.     Capillary Refill: Capillary refill takes less than 2 seconds.  Neurological:     Mental Status: She is alert and oriented to person, place, and time.      ED Treatments / Results  Labs (all labs ordered are listed, but only abnormal results are displayed) Labs Reviewed - No data to display  EKG None  Radiology Dg Ribs Unilateral W/chest Left  Result Date: 12/03/2018 CLINICAL DATA:  Pt to Er for evaluation of left anterior lower rib pain after having "my husband walk on my back Monday night and we heard a pop," she reports inspiration is painful. Pt states her shoulder blade area was sore. EXAM: LEFT RIBS AND CHEST - 3+ VIEW COMPARISON:  None. FINDINGS: Heart size is normal. The lungs are free of focal consolidations and pleural effusions. No pulmonary edema. No pneumothorax. Oblique views of the ribs are performed BB marks area maximum  tenderness. These views show an acute fracture of the LEFT anterior 8th rib. Mild scoliosis of the thoracolumbar spine. No vertebral fracture identified. IMPRESSION: Acute fracture of the LEFT anterior 8th rib. Electronically Signed   By: Nolon Nations M.D.   On: 12/03/2018 10:58    Procedures Procedures (including critical care time)  Medications Ordered in ED Medications  lidocaine (LIDODERM) 5 % 1 patch (1 patch Transdermal Patch Applied 12/03/18 1158)  oxyCODONE (Oxy IR/ROXICODONE) immediate release tablet 5  mg (5 mg Oral Given 12/03/18 1158)     Initial Impression / Assessment and Plan / ED Course  I have reviewed the triage vital signs and the nursing notes.  Pertinent labs & imaging results that were available during my care of the patient were reviewed by me and considered in my medical decision making (see chart for details).        Patient presenting for evaluation of left chest wall pain.  Physical exam reassuring, she appears nontoxic.  No clinical signs of pneumonia, no fever, cough, shortness of breath.  Tenderness palpation of the chest wall, likely MSK cause.  Will obtain x-ray for further evaluation.  X-ray viewed interpreted by me, shows fracture of the anterior eighth rib on the left side.  This correlates with patient exam.  Discussed findings with patient.  Discussed that this time there is no sign of pneumothorax or infection, however there is of the biggest risk factors.  Discussed importance of pain control, use of incentive spirometer, and monitoring of symptoms. Pt concerned about possible splenic injury. As incident occurred 4 days ago, low mechanism of action, and pt without abd pain/brusing, I have very low suspicion for splenic injury of bleeding. Will tx pain, give IS and reassess. If pain improves, plan for d/c.   On reassessment, pt states she is feeling much better.  Breathing without pain.  Discussed continued pain management and importance of monitoring  for signs of infection, pneumonia, or relapse.  At this time, patient appears safe for discharge. Return precautions given.  Patient states she understands and agrees to plan.  Final Clinical Impressions(s) / ED Diagnoses   Final diagnoses:  Closed fracture of one rib of left side, initial encounter    ED Discharge Orders         Ordered    lidocaine (LIDODERM) 5 %  Every 24 hours     12/03/18 1241    oxycodone (OXY-IR) 5 MG capsule  Every 6 hours PRN     12/03/18 Combs, Brighten Orndoff, PA-C 12/03/18 1246    Gareth Morgan, MD 12/06/18 2346

## 2018-12-03 NOTE — ED Notes (Signed)
Patient verbalizes understanding of discharge instructions. Opportunity for questioning and answers were provided. pt discharged from ED. Ambulatory by self  

## 2018-12-03 NOTE — Discharge Instructions (Addendum)
Take ibuprofen 3 times a day with meals. Take 3-4 pills at a time. Do not take other anti-inflammatories at the same time (Advil, Motrin, naproxen, Aleve).  Use oxycodone as needed for severe breakthrough pain.  Have caution, this may make you tired or groggy. Do not drive or operate heavy machinery while taking this medicine. Use lidocaine patch to hep with pain. Use ice packs to help with pain. Use the incentive spirometer 1 to 2 times per hour while awake. Return to the emergency room if you develop high fevers, increased difficulty breathing, cough, or any new, worsening, concerning symptoms.

## 2018-12-03 NOTE — ED Triage Notes (Signed)
Pt to Er for evaluation of left rib pain after having "my husband walk on my back Monday night and we heard a pop," she reports inspiration is painful.

## 2018-12-08 ENCOUNTER — Encounter: Payer: Self-pay | Admitting: Family Medicine

## 2018-12-16 ENCOUNTER — Ambulatory Visit
Admission: RE | Admit: 2018-12-16 | Discharge: 2018-12-16 | Disposition: A | Discharge status: Home / Self Care | Payer: 59 | Source: Ambulatory Visit | Attending: Family Medicine | Admitting: Family Medicine

## 2018-12-16 DIAGNOSIS — R748 Abnormal levels of other serum enzymes: Secondary | ICD-10-CM

## 2018-12-17 ENCOUNTER — Other Ambulatory Visit: Payer: Self-pay | Admitting: Family Medicine

## 2018-12-17 DIAGNOSIS — R748 Abnormal levels of other serum enzymes: Secondary | ICD-10-CM

## 2019-01-05 ENCOUNTER — Other Ambulatory Visit: Payer: Self-pay

## 2019-01-05 ENCOUNTER — Encounter: Payer: Self-pay | Admitting: Gastroenterology

## 2019-01-05 ENCOUNTER — Ambulatory Visit: Payer: 59 | Admitting: Gastroenterology

## 2019-01-05 ENCOUNTER — Other Ambulatory Visit (INDEPENDENT_AMBULATORY_CARE_PROVIDER_SITE_OTHER): Payer: 59

## 2019-01-05 VITALS — BP 112/78 | HR 77 | Temp 97.6°F | Ht 68.0 in | Wt 270.0 lb

## 2019-01-05 DIAGNOSIS — K8020 Calculus of gallbladder without cholecystitis without obstruction: Secondary | ICD-10-CM

## 2019-01-05 DIAGNOSIS — R748 Abnormal levels of other serum enzymes: Secondary | ICD-10-CM

## 2019-01-05 LAB — IRON: Iron: 62 ug/dL (ref 42–145)

## 2019-01-05 LAB — GLUCOSE, RANDOM: Glucose, Bld: 112 mg/dL — ABNORMAL HIGH (ref 70–99)

## 2019-01-05 LAB — FERRITIN: Ferritin: 57.7 ng/mL (ref 10.0–291.0)

## 2019-01-05 NOTE — Patient Instructions (Addendum)
I have recommended some additional labs today to evaluate your elevated liver enzymes and to look for treatable causes of liver inflammation. Let's plan to follow-up in a couple of weeks to review these results.   I have recommended a consultation with Dr. Barry Dienes to discuss your gallstone.   In the meantime.Marland KitchenMarland KitchenLove your liver!  Abstain from all alcohol including beer, wine, liquor, and non-alcoholic beer. Consider drinking 2-3 9 ounce cups of regular brewed coffee every day. Although limit your daily caffeine intake to no more than 400 mg.  Improve sleep hygiene to match sleep and wake times during workdays and weekends.  Sleep at least 7-9 hours every night. Use blackout curtains, turn off lights at bedtime, and limit nighttime use of electronic devices and caffeine.   The UpToDate website has excellent information about fatty liver and elevated liver enzymes.  Please call me with any questions prior to your next appointment.

## 2019-01-05 NOTE — Progress Notes (Signed)
Referring Provider: Caren Macadam, MD Primary Care Physician:  Caren Macadam, MD  Reason for Consultation: Abnormal liver enzymes  IMPRESSION:  Elevated transaminases x 5 years, ALT>AST Echogenic liver on ultrasound 2.8 cm solitary gallstone BMI 41.06 Hyperlipidemia Vitamin D deficiency Brother with Gilbert's disease No known family history of colon cancer or polyps  Suspected fatty liver and the diagnosis of insulin resistance given her obesity and hyperlipidemia.  We will plan serologic evaluation to screen for insulin resistance as well as the possibility of concurrent, treatable, chronic liver diseases.    Briefly reviewed the natural history and treatment options of fatty liver.  Treatment is currently focused on working towards a healthy weight, regular exercise, and maximizing control of diabetes and cholesterol abnormalities. Hopefully, medical therapy will be available in the future.   Discussed gallstones.  Referral to Dr. Barry Dienes for evaluation of cholecystectomy.  We will need to carefully weigh risks for preserved biliary anatomy if she has progression of any underlying liver disease.  May also consider liver biopsy at the time of cholecystectomy.  If cholecystectomy is not planned, will proceed with elastography to stage her potential liver disease.  Patient frustration with weight loss may be related to insulin resistance.  She requested a referral to the [facility] management [facility].   PLAN: - Labs today: Hepatitis C antibody, hepatitis B surface antigen, hepatitis B core antibody, fasting ferritin, fasting insulin, fasting glucose, iron, ANA, AMA, anti-smooth muscle antibody, IgG, IgM, NASH FibroSURE - Referral to Dr. Barry Dienes to consider cholecystectomy - Referral to Dr. Leafy Ro with [place] [facility] - Follow-up in 2-3 weeks to review these results  Please see the "Patient Instructions" section for addition details about the plan.   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  The history is obtained to the patient and review of her electronic health record.  She is to have a history of anxiety, hyperlipidemia and prior hypertension.  Found to have persistently elevated liver enzymes. Have become progressively more elevated over the last year. No associated symptoms. No identified exacerbating or relieving features. Blood donation within the last few years.  No prior blood transfusion.  No history of jaundice or scleral icterus.  No history of use or experimentation with IV or intranasal street drugs.  No history of autoimmune disease.  No family history of liver disease except for a brother with Gilbert's disease.  Walking regularly. Swimming twice weekly. Frustrated that she can't lose weight.   Labs 08/23/2013: AST 46, ALT 81, alk phos 53, total bilirubin 1.4 01/01/2016: AST 42, ALT 69, alk phos 67 04/22/2016: AST 31, ALT 40, alk phos 67, total bilirubin 1.2 11/26/2017: AST 66, ALT 105, alk phos 67, total bilirubin 1.6, albumin 4.6 02/16/2018: AST 42, ALT 69, alk phos 57, total bilirubin 1.5, albumin 4.4, platelets 340 11/25/2018: AST 97, ALT 174, alk phos 62, total bilirubin 1.5, total protein 7.2, albumin 4.6, WBC 7.6, hemoglobin 12.9, platelets 377, B12 1090, vitamin D low at 22, TSH 1.26  Abdominal imaging: Abdominal ultrasound 12/14/2017: Gallstones, echogenic liver Abdominal ultrasound 12/16/2018: 2.8 cm gallstone and an echogenic liver.  There was normal portal vein flow.  Spleen not included in the report.  Strong family history of gallstones. No known family history of colon cancer or polyps. No family history of uterine/endometrial cancer, pancreatic cancer or gastric/stomach cancer. No known family history of liver disease or autoimmune disease.  There is no endoscopic history.   Past Medical History:  Diagnosis Date  .  Anxiety   . Elevated liver enzymes   . Fatty liver   . Gallstones   . Hyperlipidemia   . Hypertension    not recently    Past Surgical History:  Procedure Laterality Date  . APPENDECTOMY  1997  . MYOMECTOMY N/A 02/16/2018   Procedure: ABDOMINAL MYOMECTOMY;  Surgeon: Thurnell Lose, MD;  Location: [facility];  Service: Gynecology;  Laterality: N/A;  . TONSILLECTOMY AND ADENOIDECTOMY  1988  . TRACHEOSTOMY  1979   done during epiglotitis    Current Outpatient Medications  Medication Sig Dispense Refill  . lidocaine (LIDODERM) 5 % Place 1 patch onto the skin daily. Remove & Discard patch within 12 hours or as directed by MD 30 patch 0  . Magnesium 500 MG CAPS Take 500 mg by mouth daily.    Marland Kitchen MILK THISTLE PO Take 375 mg by mouth daily.    . Multiple Vitamin (MULTIVITAMIN WITH MINERALS) TABS tablet Take 1 tablet by mouth daily.    Marland Kitchen oxycodone (OXY-IR) 5 MG capsule Take 1 capsule (5 mg total) by mouth every 6 (six) hours as needed. 8 capsule 0   No current facility-administered medications for this visit.     Allergies as of 01/05/2019  . (No Known Allergies)    Family History  Problem Relation Age of Onset  . Diabetes Mother   . Other Mother        traumatic brain injury  . Hyperlipidemia Father   . Hypertension Father   . Breast cancer Maternal Grandmother   . Alcohol abuse Maternal Grandfather   . High blood pressure Brother   . Breast cancer Other   . Heart attack Paternal Grandmother 78    Social History   Socioeconomic History  . Marital status: Married    Spouse name: Not on file  . Number of children: Not on file  . Years of education: Not on file  . Highest education level: Not on file  Occupational History  . Not on file  Social Needs  . Financial resource strain: Not on file  . Food insecurity    Worry: Not on file    Inability: Not on file  . Transportation needs    Medical: Not on file    Non-medical: Not on file  Tobacco Use  . Smoking status:  Never Smoker  . Smokeless tobacco: Never Used  Substance and Sexual Activity  . Alcohol use: Yes    Alcohol/week: 1.0 standard drinks    Types: 1 Glasses of wine per week  . Drug use: No  . Sexual activity: Yes    Partners: Male  Lifestyle  . Physical activity    Days per week: Not on file    Minutes per session: Not on file  . Stress: Not on file  Relationships  . Social Herbalist on phone: Not on file    Gets together: Not on file    Attends religious service: Not on file    Active member of club or organization: Not on file    Attends meetings of clubs or organizations: Not on file    Relationship status: Not on file  . Intimate partner violence    Fear of current or ex partner: Not on file    Emotionally abused: Not on file    Physically abused: Not on file    Forced sexual activity: Not on file  Other Topics Concern  . Not on file  Social History Narrative  . Not on file    Review of Systems: 12 system ROS is negative except as noted above.   Physical Exam General:   Alert, in NAD.  HEENT: No scleral icterus. No bilateral temporal wasting.  Heart:  Regular rate and rhythm; no murmurs Pulm: Clear anteriorly; no wheezing Abdomen:  Soft. Mild central obesity. Nontender. Nondistended. Normal bowel sounds. No rebound or guarding. No fluid wave. No hepatosplenomegaly.  LAD: No inguinal or umbilical LAD Extremities:  Without edema. Neurologic:  Alert and  oriented x4;  grossly normal neurologically; no asterixis or clonus. Skin: No jaundice. No palmar erythema or spider angioma. No Terry's nails.  Psych:  Alert and cooperative. Normal mood and affect.   Farron Lafond L. Tarri Glenn, MD, MPH 01/05/2019, 8:40 AM

## 2019-01-07 LAB — NASH FIBROSURE
ALPHA 2-MACROGLOBULINS, QN: 157 mg/dL (ref 110–276)
ALT (SGPT) P5P: 118 IU/L — ABNORMAL HIGH (ref 0–40)
AST (SGOT) P5P: 69 IU/L — ABNORMAL HIGH (ref 0–40)
Apolipoprotein A-1: 143 mg/dL (ref 116–209)
Bilirubin, Total: 0.6 mg/dL (ref 0.0–1.2)
Cholesterol, Total: 246 mg/dL — ABNORMAL HIGH (ref 100–199)
Fibrosis Score: 0.1 (ref 0.00–0.21)
GGT: 44 IU/L (ref 0–60)
Glucose: 113 mg/dL — ABNORMAL HIGH (ref 65–99)
Haptoglobin: 208 mg/dL (ref 42–296)
Height: 68 in
NASH Score: 0.5 — ABNORMAL HIGH
Steatosis Score: 0.86 — ABNORMAL HIGH (ref 0.00–0.30)
Triglycerides: 296 mg/dL — ABNORMAL HIGH (ref 0–149)
Weight: 270 [lb_av]

## 2019-01-07 LAB — MITOCHONDRIAL ANTIBODIES: Mitochondrial M2 Ab, IgG: <=20 U

## 2019-01-07 LAB — ANTI-NUCLEAR AB-TITER (ANA TITER)
ANA TITER: 1:40 {titer} — ABNORMAL HIGH
ANA Titer 1: 1:40 {titer} — ABNORMAL HIGH

## 2019-01-07 LAB — IGM: IgM, Serum: 38 mg/dL — ABNORMAL LOW (ref 50–300)

## 2019-01-07 LAB — IGG: IgG (Immunoglobin G), Serum: 979 mg/dL (ref 600–1640)

## 2019-01-07 LAB — HEPATITIS B SURFACE ANTIGEN: Hepatitis B Surface Ag: NONREACTIVE

## 2019-01-07 LAB — HEPATITIS B CORE ANTIBODY, TOTAL: Hep B Core Total Ab: NONREACTIVE

## 2019-01-07 LAB — HEPATITIS C ANTIBODY
Hepatitis C Ab: NONREACTIVE
SIGNAL TO CUT-OFF: 0.03 (ref <1.00)

## 2019-01-07 LAB — ANA: Anti Nuclear Antibody (ANA): POSITIVE — AB

## 2019-01-07 LAB — ANTI-SMOOTH MUSCLE ANTIBODY, IGG: Actin (Smooth Muscle) Antibody (IGG): <20 U (ref <20)

## 2019-01-17 ENCOUNTER — Other Ambulatory Visit: Payer: Self-pay

## 2019-01-17 DIAGNOSIS — Z20822 Contact with and (suspected) exposure to covid-19: Secondary | ICD-10-CM

## 2019-01-18 LAB — NOVEL CORONAVIRUS, NAA: SARS-CoV-2, NAA: NOT DETECTED

## 2019-01-23 ENCOUNTER — Encounter: Payer: Self-pay | Admitting: Gastroenterology

## 2019-01-23 ENCOUNTER — Ambulatory Visit: Payer: 59 | Admitting: Gastroenterology

## 2019-01-23 ENCOUNTER — Other Ambulatory Visit: Payer: Self-pay

## 2019-01-23 VITALS — BP 122/74 | HR 86 | Ht 68.0 in | Wt 270.0 lb

## 2019-01-23 DIAGNOSIS — K8020 Calculus of gallbladder without cholecystitis without obstruction: Secondary | ICD-10-CM | POA: Diagnosis not present

## 2019-01-23 DIAGNOSIS — R748 Abnormal levels of other serum enzymes: Secondary | ICD-10-CM | POA: Diagnosis not present

## 2019-01-23 NOTE — Progress Notes (Signed)
Referring Provider: Caren Macadam, MD Primary Care Physician:  Caren Macadam, MD  Chief complaint: Abnormal liver enzymes  IMPRESSION:  Suspected NASH    - Elevated transaminases x 5 years, ALT>AST    - Echogenic liver on ultrasound    - NASH FibroSURE F0, S3, NASH score 0.5 suggesting N1-borderline or probable NASH ANA 1:40 Isolated low IgM with normal IgG 2.8 cm solitary gallstone BMI 41.06 Hyperlipidemia Vitamin D deficiency Brother with Gilbert's disease No known family history of colon cancer or polyps  Labs show suspected NASH without significant fibrosis. Her ANA is not strongly positive and other markers of autoimmune hepatitis are negative.  I remain suspicious for insulin resistance given her obesity and hyperlipidemia. Unfortunately, a fasting insulin was not performed with her other labs.     Discussed the natural history and treatment options of fatty liver.  Treatment is currently focused on working towards a healthy weight, regular exercise, and maximizing control of diabetes and cholesterol abnormalities. Hopefully, medical therapy will be available in the future. Will plan close monitoring for disease progression.   Isolated low IgM level identified. 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 Will repeat the IgM in 6 months for reassurance.  Will repeat referral to Dr. Barry Dienes for evaluation of cholecystectomy given her large gallstone.  We will need to carefully weigh risks for preserved biliary anatomy if she has progression of any underlying liver disease.  May also consider liver biopsy at the time of cholecystectomy.  If cholecystectomy is not planned, will proceed with elastography to stage her potential liver disease.  Awaiting consultation with [facility].   PLAN: - fasting insulin, fasting glucose, HgbA1C - FibroSURE  and elastography in 6 months - Repeat serum IgM in 6 months - Referral to Dr. Barry Dienes to consider cholecystectomy - Referral to Dr. Leafy Ro with [facility] - Review ANA + with Dr. Ethlyn Gallery to determine if additional evaluation/follow-up is indicated - Follow-up in 6 months, 2 weeks after the FibroSURE and elastography - Start colon cancer screening at age 76-50  Please see the "Patient Instructions" section for addition details about the plan.  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  The interval history is obtained to the patient and review of her electronic health record.  She has anxiety, hyperlipidemia and prior hypertension. Found to have persistently elevated liver enzymes. Have become progressively more elevated over the last year.   Multiple labs obtained at the time of her initial consultation. Still no associated symptoms.  Frustrated by steady weight gain despite dieting and walking/swimming throughout the week.   Has not heard from Dr. Barry Dienes regarding her surgical consultation. Wait list for [facility] until January 2021   Labs - 08/23/2013: AST 46, ALT 81, alk phos 53, total bilirubin 1.4 - 01/01/2016: AST 42, ALT 69, alk phos 67 - 04/22/2016: AST 31, ALT 40, alk phos 67, total bilirubin 1.2 - 11/26/2017: AST 66, ALT 105, alk phos 67, total bilirubin 1.6, albumin 4.6 - 02/16/2018: AST 42, ALT 69, alk phos 57, total bilirubin 1.5, albumin 4.4, platelets 340 - 11/25/2018: AST 97, ALT 174, alk phos 62, total bilirubin 1.5, total protein 7.2, albumin 4.6, WBC 7.6, hemoglobin 12.9, platelets 377, B12 1090, vitamin D low at 22, TSH 1.26 - 01/05/19: IgG 979, IgM 38, TB 0.6,  ALT 118, glucose 113, GGT 44, iron 62, ferritin 57.7 - 01/05/19: ANA 1:40, nuclear, speckled -01/05/19: NASH FibroSURE F0, S3 - marked or severe steatotis, NASH score 0.5 suggesting N1-borderline or  probable NASH  Abdominal imaging: - Abdominal ultrasound 12/14/2017: Gallstones, echogenic liver - Abdominal ultrasound 12/16/2018: 2.8 cm gallstone and an echogenic liver.  There was normal portal vein flow.  Spleen not included in the report.  Strong family history of gallstones. No known family history of colon cancer or polyps. No family history of uterine/endometrial cancer, pancreatic cancer or gastric/stomach cancer. No known family history of liver disease or autoimmune disease.   There is no endoscopic history.   Past Medical History:  Diagnosis Date  . Anxiety   . Elevated liver enzymes   . Fatty liver   . Gallstones   . Hyperlipidemia   . Hypertension    not recently    Past Surgical History:  Procedure Laterality Date  . APPENDECTOMY  1997  . MYOMECTOMY N/A 02/16/2018   Procedure: ABDOMINAL MYOMECTOMY;  Surgeon: Thurnell Lose, MD;  Location: [facility];  Service: Gynecology;  Laterality: N/A;  . TONSILLECTOMY AND ADENOIDECTOMY  1988  . TRACHEOSTOMY  1979   done during epiglotitis    Current Outpatient Medications  Medication Sig Dispense Refill  . Magnesium 500 MG CAPS Take 500 mg by mouth daily.    Marland Kitchen MILK THISTLE PO Take 375 mg by mouth daily.    . Multiple Vitamin (MULTIVITAMIN WITH MINERALS) TABS tablet Take 1 tablet by mouth daily.     No current facility-administered medications for this visit.     Allergies as of 01/23/2019  . (No Known Allergies)    Family History  Problem Relation Age of Onset  . Diabetes Mother   . Other Mother        traumatic brain injury  . Hyperlipidemia Father   . Hypertension Father   . Breast cancer Maternal Grandmother   . Alcohol abuse Maternal Grandfather   . High blood pressure Brother   . Breast cancer Other   . Heart attack Paternal Grandmother 78  . Colon cancer Neg Hx     Social History   Socioeconomic History  . Marital status: Married    Spouse name: Not on file  . Number of children: Not on file  .  Years of education: Not on file  . Highest education level: Not on file  Occupational History  . Not on file  Social Needs  . Financial resource strain: Not on file  . Food insecurity    Worry: Not on file    Inability: Not on file  . Transportation needs    Medical: Not on file    Non-medical: Not on file  Tobacco Use  . Smoking status: Never Smoker  . Smokeless tobacco: Never Used  Substance and Sexual Activity  . Alcohol use: Yes    Alcohol/week: 1.0 standard drinks    Types: 1 Glasses of wine per week  . Drug use: No  . Sexual activity: Yes    Partners: Male  Lifestyle  . Physical activity    Days per week: Not on file    Minutes per session: Not on file  . Stress: Not on file  Relationships  . Social Herbalist on phone: Not on file    Gets together: Not on file    Attends religious service: Not on file    Active member of club or organization: Not on file  Attends meetings of clubs or organizations: Not on file    Relationship status: Not on file  . Intimate partner violence    Fear of current or ex partner: Not on file    Emotionally abused: Not on file    Physically abused: Not on file    Forced sexual activity: Not on file  Other Topics Concern  . Not on file  Social History Narrative  . Not on file    Review of Systems: 12 system ROS is negative except as noted above.   Physical Exam General:   Alert, in NAD.  HEENT: No scleral icterus. No bilateral temporal wasting.  Heart:  Regular rate and rhythm; no murmurs Pulm: Clear anteriorly; no wheezing Abdomen:  Soft. Mild central obesity. Nontender. Nondistended. Normal bowel sounds. No rebound or guarding. No fluid wave. No hepatosplenomegaly.  LAD: No inguinal or umbilical LAD Extremities:  Without edema. Neurologic:  Alert and  oriented x4;  grossly normal neurologically; no asterixis or clonus. Skin: No jaundice. No palmar erythema or spider angioma. No Terry's nails.  Psych:  Alert and  cooperative. Normal mood and affect.   Miki Blank L. Tarri Glenn, MD, MPH 01/23/2019, 10:40 AM

## 2019-01-23 NOTE — Patient Instructions (Addendum)
My apologizes for the lab oversight. I would like for you to return for fasting labs to look for insulin resistance.  I will share the results with Dr. Ethlyn Gallery.   I would like for you to see Dr. Norman Clay to discuss the gallstone and possible cholecystectomy.   I recommend follow-up in 6 months with additional imaging and labs to monitor for liver inflammation and damage at that time.

## 2019-01-24 ENCOUNTER — Other Ambulatory Visit (INDEPENDENT_AMBULATORY_CARE_PROVIDER_SITE_OTHER): Payer: 59

## 2019-01-24 DIAGNOSIS — K8020 Calculus of gallbladder without cholecystitis without obstruction: Secondary | ICD-10-CM | POA: Diagnosis not present

## 2019-01-24 DIAGNOSIS — R748 Abnormal levels of other serum enzymes: Secondary | ICD-10-CM | POA: Diagnosis not present

## 2019-01-24 LAB — HEMOGLOBIN A1C: Hgb A1c MFr Bld: 5.7 % (ref 4.6–6.5)

## 2019-01-24 LAB — GLUCOSE, RANDOM: Glucose, Bld: 118 mg/dL — ABNORMAL HIGH (ref 70–99)

## 2019-01-24 NOTE — Addendum Note (Signed)
Addended by: Wyline Beady on: 01/24/2019 09:01 AM   Modules accepted: Orders

## 2019-01-29 ENCOUNTER — Encounter: Payer: Self-pay | Admitting: Family Medicine

## 2019-01-29 LAB — INSULIN, FREE AND TOTAL
Free Insulin: 25 uU/mL — ABNORMAL HIGH
Total Insulin: 25 uU/mL

## 2019-01-30 ENCOUNTER — Other Ambulatory Visit: Payer: Self-pay

## 2019-01-30 ENCOUNTER — Ambulatory Visit: Payer: 59 | Admitting: Family Medicine

## 2019-01-30 ENCOUNTER — Encounter: Payer: Self-pay | Admitting: Family Medicine

## 2019-01-30 VITALS — BP 124/80 | HR 83 | Temp 98.1°F | Ht 68.0 in | Wt 271.9 lb

## 2019-01-30 DIAGNOSIS — H60333 Swimmer's ear, bilateral: Secondary | ICD-10-CM | POA: Diagnosis not present

## 2019-01-30 DIAGNOSIS — H66001 Acute suppurative otitis media without spontaneous rupture of ear drum, right ear: Secondary | ICD-10-CM

## 2019-01-30 DIAGNOSIS — E8881 Metabolic syndrome: Secondary | ICD-10-CM | POA: Diagnosis not present

## 2019-01-30 DIAGNOSIS — E88819 Insulin resistance, unspecified: Secondary | ICD-10-CM

## 2019-01-30 MED ORDER — METFORMIN HCL 500 MG PO TABS: 500.0000 mg | ORAL_TABLET | Freq: Two times a day (BID) | ORAL | 2 refills | Status: DC

## 2019-01-30 MED ORDER — AMOXICILLIN 500 MG PO CAPS: 1000.0000 mg | ORAL_CAPSULE | Freq: Two times a day (BID) | ORAL | 0 refills | Status: DC

## 2019-01-30 MED ORDER — NEOMYCIN-POLYMYXIN-HC 3.5-10000-1 OT SOLN: 3.0000 [drp] | Freq: Four times a day (QID) | OTIC | 0 refills | Status: DC

## 2019-01-30 NOTE — Progress Notes (Signed)
Darlene Mccormick DOB: Aug 25, 1975 Encounter date: 01/30/2019  This is a 43 y.o. female who presents with No chief complaint on file.   History of present illness: Swimming 3 times a week and walking. Diet not perfect, but is generally good. Not eating high carb diet.   Swimming laps multiple days/week. Thinks water got in ear. Started getting sore Sat night; then yesterday started to hurt.  Aching. Took ibuprofen before visit. Behind, under ear, in front of ear and into jaw. Bad this morning. Little headache across front of head. No fevers. No drainage from ear. Went and bought alcohol ear drops and earache drops - didn't help.   No Known Allergies No outpatient medications have been marked as taking for the 01/30/19 encounter (Appointment) with Caren Macadam, MD.    Review of Systems  Constitutional: Negative for chills, fatigue and fever.  HENT: Positive for ear pain and sinus pain. Negative for congestion, ear discharge, facial swelling, sinus pressure and sore throat.   Respiratory: Negative for cough, chest tightness, shortness of breath and wheezing.   Cardiovascular: Negative for chest pain, palpitations and leg swelling.    Objective:  There were no vitals taken for this visit.      BP Readings from Last 3 Encounters:  01/23/19 122/74  01/05/19 112/78  12/03/18 121/74   Wt Readings from Last 3 Encounters:  01/23/19 270 lb (122.5 kg)  01/05/19 270 lb (122.5 kg)  11/25/18 269 lb 6.4 oz (122.2 kg)    Physical Exam Constitutional:      General: She is not in acute distress.    Appearance: She is well-developed.  HENT:     Right Ear: Tenderness present. A middle ear effusion is present. Tympanic membrane is injected and erythematous.     Left Ear: Tympanic membrane is erythematous.     Ears:     Comments: There is grayish debris in bilateral ear canals, more significant on the right.  There is tenderness with movement of the right ear.  There is some  erythema and swelling of the right ear.  Ear canal is edematous on the right. Cardiovascular:     Rate and Rhythm: Normal rate and regular rhythm.     Heart sounds: Normal heart sounds. No murmur. No friction rub.  Pulmonary:     Effort: Pulmonary effort is normal. No respiratory distress.     Breath sounds: Normal breath sounds. No wheezing or rales.  Musculoskeletal:     Right lower leg: No edema.     Left lower leg: No edema.  Neurological:     Mental Status: She is alert and oriented to person, place, and time.  Psychiatric:        Behavior: Behavior normal.     Assessment/Plan 1. Acute swimmer's ear of both sides Cortisporin drops as prescribed.  2. Acute suppurative otitis media of right ear without spontaneous rupture of tympanic membrane, recurrence not specified Amoxicillin as directed.  Let us know if any worsening of discomfort or not noting some improvement in 48 hours.  3. Insulin resistance Discussed low-carb diet in detail as well as regular exercise and importance of these 2 things.  She is on the wait list for meeting with Dr. Leafy Ro for weight loss.  Discussed medication options and we are going to start Metformin to help with some of the insulin resistance.  I am hopeful this will also help her along with exercise and a low carbohydrate diet with weight loss.  She has  a follow-up already scheduled in 1 month so we can touch base then.   Return for has visit scheduled.    Micheline Rough, MD

## 2019-01-30 NOTE — Patient Instructions (Addendum)
Try to get 3 carb choices per meal (max). Each carb choice = 15grams of carbohydrates.   Otitis Externa  Otitis externa is an infection of the outer ear canal. The outer ear canal is the area between the outside of the ear and the eardrum. Otitis externa is sometimes called swimmer's ear. What are the causes? Common causes of this condition include:  Swimming in dirty water.  Moisture in the ear.  An injury to the inside of the ear.  An object stuck in the ear.  A cut or scrape on the outside of the ear. What increases the risk? You are more likely to develop this condition if you go swimming often. What are the signs or symptoms? The first symptom of this condition is often itching in the ear. Later symptoms of the condition include:  Swelling of the ear.  Redness in the ear.  Ear pain. The pain may get worse when you pull on your ear.  Pus coming from the ear. How is this diagnosed? This condition may be diagnosed by examining the ear and testing fluid from the ear for bacteria and funguses. How is this treated? This condition may be treated with:  Antibiotic ear drops. These are often given for 10-14 days.  Medicines to reduce itching and swelling. Follow these instructions at home:  If you were prescribed antibiotic ear drops, use them as told by your health care provider. Do not stop using the antibiotic even if your condition improves.  Take over-the-counter and prescription medicines only as told by your health care provider.  Avoid getting water in your ears as told by your health care provider. This may include avoiding swimming or water sports for a few days.  Keep all follow-up visits as told by your health care provider. This is important. How is this prevented?  Keep your ears dry. Use the corner of a towel to dry your ears after you swim or bathe.  Avoid scratching or putting things in your ear. Doing these things can damage the ear canal or remove the  protective wax that lines it, which makes it easier for bacteria and funguses to grow.  Avoid swimming in lakes, polluted water, or pools that may not have enough chlorine. Contact a health care provider if:  You have a fever.  Your ear is still red, swollen, painful, or draining pus after 3 days.  Your redness, swelling, or pain gets worse.  You have a severe headache.  You have redness, swelling, pain, or tenderness in the area behind your ear. Summary  Otitis externa is an infection of the outer ear canal.  Common causes include swimming in dirty water, moisture in the ear, or a cut or scrape in the ear.  Symptoms include pain, redness, and swelling of the ear.  If you were prescribed antibiotic ear drops, use them as told by your health care provider. Do not stop using the antibiotic even if your condition improves. This information is not intended to replace advice given to you by your health care provider. Make sure you discuss any questions you have with your health care provider. Document Released: 03/16/2005 Document Revised: 08/20/2017 Document Reviewed: 08/20/2017 Elsevier Patient Education  2020 [employer].

## 2019-02-01 ENCOUNTER — Encounter: Payer: Self-pay | Admitting: Family Medicine

## 2019-02-02 ENCOUNTER — Other Ambulatory Visit: Payer: Self-pay | Admitting: Family Medicine

## 2019-02-02 MED ORDER — AMOXICILLIN-POT CLAVULANATE 875-125 MG PO TABS: 1.0000 | ORAL_TABLET | Freq: Two times a day (BID) | ORAL | 0 refills | Status: DC

## 2019-02-02 MED ORDER — PREDNISONE 20 MG PO TABS: 40.0000 mg | ORAL_TABLET | Freq: Every day | ORAL | 0 refills | Status: AC

## 2019-03-01 ENCOUNTER — Ambulatory Visit: Payer: 59 | Admitting: Family Medicine

## 2019-03-14 ENCOUNTER — Ambulatory Visit
Admission: RE | Admit: 2019-03-14 | Discharge: 2019-03-14 | Disposition: A | Discharge status: Home / Self Care | Payer: 59 | Source: Ambulatory Visit | Attending: Obstetrics and Gynecology | Admitting: Obstetrics and Gynecology

## 2019-03-14 ENCOUNTER — Other Ambulatory Visit: Payer: Self-pay

## 2019-03-14 DIAGNOSIS — N63 Unspecified lump in unspecified breast: Secondary | ICD-10-CM

## 2019-03-17 ENCOUNTER — Other Ambulatory Visit: Payer: Self-pay

## 2019-03-20 ENCOUNTER — Telehealth: Payer: 59 | Admitting: Family Medicine

## 2019-03-22 ENCOUNTER — Ambulatory Visit: Payer: 59 | Attending: Internal Medicine

## 2019-03-22 DIAGNOSIS — Z20822 Contact with and (suspected) exposure to covid-19: Secondary | ICD-10-CM

## 2019-03-23 LAB — NOVEL CORONAVIRUS, NAA: SARS-CoV-2, NAA: NOT DETECTED

## 2019-05-03 ENCOUNTER — Ambulatory Visit (INDEPENDENT_AMBULATORY_CARE_PROVIDER_SITE_OTHER): Payer: 59 | Admitting: Bariatrics

## 2019-05-03 ENCOUNTER — Other Ambulatory Visit: Payer: Self-pay

## 2019-05-03 ENCOUNTER — Encounter (INDEPENDENT_AMBULATORY_CARE_PROVIDER_SITE_OTHER): Payer: Self-pay | Admitting: Bariatrics

## 2019-05-03 VITALS — BP 119/76 | HR 62 | Temp 98.4°F | Ht 68.0 in | Wt 261.0 lb

## 2019-05-03 DIAGNOSIS — Z0289 Encounter for other administrative examinations: Secondary | ICD-10-CM

## 2019-05-03 DIAGNOSIS — E782 Mixed hyperlipidemia: Secondary | ICD-10-CM

## 2019-05-03 DIAGNOSIS — Z9189 Other specified personal risk factors, not elsewhere classified: Secondary | ICD-10-CM | POA: Diagnosis not present

## 2019-05-03 DIAGNOSIS — R5383 Other fatigue: Secondary | ICD-10-CM | POA: Diagnosis not present

## 2019-05-03 DIAGNOSIS — Z1331 Encounter for screening for depression: Secondary | ICD-10-CM

## 2019-05-03 DIAGNOSIS — R0602 Shortness of breath: Secondary | ICD-10-CM

## 2019-05-03 DIAGNOSIS — K760 Fatty (change of) liver, not elsewhere classified: Secondary | ICD-10-CM

## 2019-05-03 DIAGNOSIS — R7303 Prediabetes: Secondary | ICD-10-CM | POA: Diagnosis not present

## 2019-05-03 DIAGNOSIS — E559 Vitamin D deficiency, unspecified: Secondary | ICD-10-CM

## 2019-05-03 DIAGNOSIS — Z6839 Body mass index (BMI) 39.0-39.9, adult: Secondary | ICD-10-CM

## 2019-05-03 NOTE — Progress Notes (Signed)
Dear Dr. Micheline Rough,   Thank you for referring Darlene Mccormick to our clinic. The following note includes my evaluation and treatment recommendations.  Chief Complaint:   OBESITY Darlene Mccormick (MR# [id number]) is a 44 y.o. female who presents for evaluation and treatment of obesity and related comorbidities. Current BMI is Body mass index is 39.68 kg/m.Marland Kitchen Darlene Mccormick has been struggling with her weight for many years and has been unsuccessful in either losing weight, maintaining weight loss, or reaching her healthy weight goal.  Darlene Mccormick is currently in the action stage of change and ready to dedicate time achieving and maintaining a healthier weight. Darlene Mccormick is interested in becoming our patient and working on intensive lifestyle modifications including (but not limited to) diet and exercise for weight loss.  Darlene Mccormick does like to cook at times, but notes being tired as an obstacle. She does snack on carbohydrates sometimes.   Darlene Mccormick's habits were reviewed today and are as follows: Her family eats meals together, she thinks her family will eat healthier with her, her desired weight loss is 61-71 lbs, she has been heavy most of her life, she started gaining weight at the age of 49, her heaviest weight ever was 275 pounds, she craves chips and dark chocolate, she sometimes makes poor food choices, she frequently eats larger portions than normal, she has binge eating behaviors and she struggles with emotional eating.  Depression Screen Darlene Mccormick's Food and Mood (modified PHQ-9) score was 10.  Depression screen PHQ 2/9 05/03/2019  Decreased Interest 2  Down, Depressed, Hopeless 2  PHQ - 2 Score 4  Altered sleeping 1  Tired, decreased energy 1  Change in appetite 1  Feeling bad or failure about yourself  1  Trouble concentrating 1  Moving slowly or fidgety/restless 1  Suicidal thoughts 0  PHQ-9 Score 10  Difficult doing work/chores Somewhat difficult   Subjective:   Other fatigue. Darlene Mccormick  denies daytime somnolence and denies waking up still tired. Darlene Mccormick generally gets 7 hours of sleep per night, and states that she has generally restful sleep. Snoring is present. Apneic episodes are not present. Epworth Sleepiness Score is 7.  SOB (shortness of breath) on exertion. Darlene Mccormick notes increasing shortness of breath with certain activities and seems to be worsening over time with weight gain. She notes getting out of breath sooner with activity than she used to. This has gotten worse recently. Darlene Mccormick denies shortness of breath at rest or orthopnea.  Prediabetes. Darlene Mccormick has a diagnosis of prediabetes based on her elevated HgA1c and was informed this puts her at greater risk of developing diabetes. She continues to work on diet and exercise to decrease her risk of diabetes. She denies nausea or hypoglycemia. [name] is taking metformin. Free insulin was 25 on 01/24/2019.  Lab Results  Component Value Date   HGBA1C 5.7 01/24/2019   No results found for: INSULIN  Hyperlipidemia, mixed. Darlene Mccormick is on no medications.  Lab Results  Component Value Date   CHOL 246 (H) 01/05/2019   HDL 40 (L) 11/25/2018   LDLCALC 193 (H) 11/25/2018   LDLDIRECT 166.0 11/26/2017   TRIG 296 (H) 01/05/2019   CHOLHDL 7.0 (H) 11/25/2018   Lab Results  Component Value Date   ALT 174 (H) 11/25/2018   AST 97 (H) 11/25/2018   ALKPHOS 57 02/16/2018   BILITOT 1.5 (H) 11/25/2018   The 10-year ASCVD risk score Darlene Mccormick DC Jr., et al., 2013) is: 1.5%   Values used to calculate the score:  Age: 84 years     Sex: Female     Is Non-Hispanic African American: No     Diabetic: No     Tobacco smoker: No     Systolic Blood Pressure: 123456 mmHg     Is BP treated: No     HDL Cholesterol: 40 mg/dL     Total Cholesterol: 246 mg/dL  Vitamin D deficiency. Darlene Mccormick is taking Vitamin D OTC. Last Vitamin D level 22 on 11/25/2018.  NAFLD (nonalcoholic fatty liver disease). CT scan of the liver in September, 2019 showed hepatic steatosis.  She states previous liver enzymes were elevated.  Depression screening. Darlene Mccormick had a moderately positive depression screening with a PHQ-9 score of 10.  At risk for diabetes mellitus. Darlene Mccormick is at higher than average risk for developing diabetes due to her prediabetes and obesity.   Assessment/Plan:   Other fatigue. Darlene Mccormick does feel that her weight is causing her energy to be lower than it should be. Fatigue may be related to obesity, depression or many other causes. Labs will be ordered, and in the meanwhile, Darlene Mccormick will focus on self care including making healthy food choices, increasing physical activity and focusing on stress reduction. EKG 12-Lead, T3, T4, free, TSH ordered today.  SOB (shortness of breath) on exertion. Darlene Mccormick does feel that she gets out of breath more easily that she used to when she exercises. Darlene Mccormick's shortness of breath appears to be obesity related and exercise induced. She has agreed to work on weight loss and gradually increase exercise to treat her exercise induced shortness of breath. Will continue to monitor closely.  Prediabetes. Darlene Mccormick will continue to work on weight loss, exercise, increasing healthy fats and protein, and decreasing simple carbohydrates to help decrease the risk of diabetes. Handout was given on Prediabetes/Insulin Resistance. Hemoglobin A1c, Insulin, random labs ordered.   Hyperlipidemia, mixed. Cardiovascular risk and specific lipid/LDL goals reviewed.  We discussed several lifestyle modifications today and Darlene Mccormick will continue to work on diet, exercise and weight loss efforts. Orders and follow up as documented in patient record.   Counseling Intensive lifestyle modifications are the first line treatment for this issue. . Dietary changes: Increase soluble fiber. Decrease simple carbohydrates. . Exercise changes: Moderate to vigorous-intensity aerobic activity 150 minutes per week if tolerated. . Lipid-lowering medications: see documented in medical  record.  Vitamin D deficiency. Low Vitamin D level contributes to fatigue and are associated with obesity, breast, and colon cancer. Routine testing of VITAMIN D 25 Hydroxy (Vit-D Deficiency, Fractures) was ordered today.  NAFLD (nonalcoholic fatty liver disease). Darlene Mccormick was informed that weight loss of 5-10% would impact her NAFLD. She was instructed to exercise (cardio; resistance) 150 minutes a week.  Depression screening. Darlene Mccormick had a positive depression screening. Depression is commonly associated with obesity and often results in emotional eating behaviors. We will monitor this closely and work on CBT to help improve the non-hunger eating patterns. Referral to Psychology may be required if no improvement is seen as she continues in our clinic.  At risk for diabetes mellitus. Darlene Mccormick was given approximately 15 minutes of diabetes education and counseling today. We discussed intensive lifestyle modifications today with an emphasis on weight loss as well as increasing exercise and decreasing simple carbohydrates in her diet. We also reviewed medication options with an emphasis on risk versus benefit of those discussed.   Repetitive spaced learning was employed today to elicit superior memory formation and behavioral change.  Class 2 severe obesity with serious comorbidity and  body mass index (BMI) of 39.0 to 39.9 in adult, unspecified obesity type ([place]).  Darlene Mccormick is currently in the action stage of change and her goal is to continue with weight loss efforts. I recommend Moneta begin the structured treatment plan as follows:  She has agreed to the Category 3 Plan and journal 250-350 calories and 25+ grams of protein at breakfast.  We independently reviewed lab results with the patient including glucose, A1c, and free insulin from 01/24/2019.  She will work on meal planning, will use Noom also, and will watch triggers for emotional eating.  Exercise goals: Amiyha wears a Ecologist. She will swim 3 times a  week and will walk.   Behavioral modification strategies: increasing lean protein intake, decreasing simple carbohydrates, increasing vegetables, increasing water intake, decreasing eating out, no skipping meals, meal planning and cooking strategies, keeping healthy foods in the home and planning for success.  She was informed of the importance of frequent follow-up visits to maximize her success with intensive lifestyle modifications for her multiple health conditions. She was informed we would discuss her lab results at her next visit unless there is a critical issue that needs to be addressed sooner. Yodit agreed to keep her next visit at the agreed upon time to discuss these results.  Objective:   Blood pressure 119/76, pulse 62, temperature 98.4 F (36.9 C), temperature source Oral, height 5\' 8"  (1.727 m), weight 261 lb (118.4 kg), last menstrual period 04/09/2019, SpO2 97 %. Body mass index is 39.68 kg/m.  EKG: Normal sinus rhythm, rate 76 BPM. Within normal limits.  Indirect Calorimeter completed today shows a VO2 of 265 and a REE of 1846.  Her calculated basal metabolic rate is [id number] thus her basal metabolic rate is worse than expected.  General: Cooperative, alert, well developed, in no acute distress. HEENT: Conjunctivae and lids unremarkable. Cardiovascular: Regular rhythm.  Lungs: Normal work of breathing. Neurologic: No focal deficits.   Lab Results  Component Value Date   CREATININE 0.66 11/25/2018   BUN 9 11/25/2018   NA 137 11/25/2018   K 4.4 11/25/2018   CL 101 11/25/2018   CO2 23 11/25/2018   Lab Results  Component Value Date   ALT 174 (H) 11/25/2018   AST 97 (H) 11/25/2018   ALKPHOS 57 02/16/2018   BILITOT 1.5 (H) 11/25/2018   Lab Results  Component Value Date   HGBA1C 5.7 01/24/2019   HGBA1C 5.6 11/26/2017   No results found for: INSULIN Lab Results  Component Value Date   TSH 1.26 11/25/2018   Lab Results  Component Value Date   CHOL 246 (H)  01/05/2019   HDL 40 (L) 11/25/2018   LDLCALC 193 (H) 11/25/2018   LDLDIRECT 166.0 11/26/2017   TRIG 296 (H) 01/05/2019   CHOLHDL 7.0 (H) 11/25/2018   Lab Results  Component Value Date   WBC 7.6 11/25/2018   HGB 12.9 11/25/2018   HCT 37.9 11/25/2018   MCV 87.3 11/25/2018   PLT 377 11/25/2018   Lab Results  Component Value Date   IRON 62 01/05/2019   FERRITIN 57.7 01/05/2019   Attestation Statements:   Reviewed by clinician on day of visit: allergies, medications, problem list, medical history, surgical history, family history, social history, and previous encounter notes.  Darlene Mccormick, am acting as Location manager for [employer], DO   I have reviewed the above documentation for accuracy and completeness, and I agree with the above. Darlene Lesch, DO

## 2019-05-04 LAB — TSH: TSH: 1.06 u[IU]/mL (ref 0.450–4.500)

## 2019-05-04 LAB — T3: T3, Total: 106 ng/dL (ref 71–180)

## 2019-05-04 LAB — INSULIN, RANDOM: INSULIN: 16.2 u[IU]/mL (ref 2.6–24.9)

## 2019-05-04 LAB — HEMOGLOBIN A1C
Est. average glucose Bld gHb Est-mCnc: 117 mg/dL
Hgb A1c MFr Bld: 5.7 % — ABNORMAL HIGH (ref 4.8–5.6)

## 2019-05-04 LAB — T4, FREE: Free T4: 1.29 ng/dL (ref 0.82–1.77)

## 2019-05-04 LAB — VITAMIN D 25 HYDROXY (VIT D DEFICIENCY, FRACTURES): Vit D, 25-Hydroxy: 43 ng/mL (ref 30.0–100.0)

## 2019-05-09 IMAGING — MG DIGITAL DIAGNOSTIC UNILATERAL RIGHT MAMMOGRAM WITH TOMO AND CAD
4 series · 4 of 12 positions shown · non-contrast
Comparison: Baseline screening mammogram dated 03/10/2018.

CLINICAL DATA: Patient was called back from screening mammogram for
a possible mass in the right breast.

EXAM:
DIGITAL DIAGNOSTIC RIGHT MAMMOGRAM WITH TOMO
ULTRASOUND RIGHT BREAST

[R MLO synth-2D]
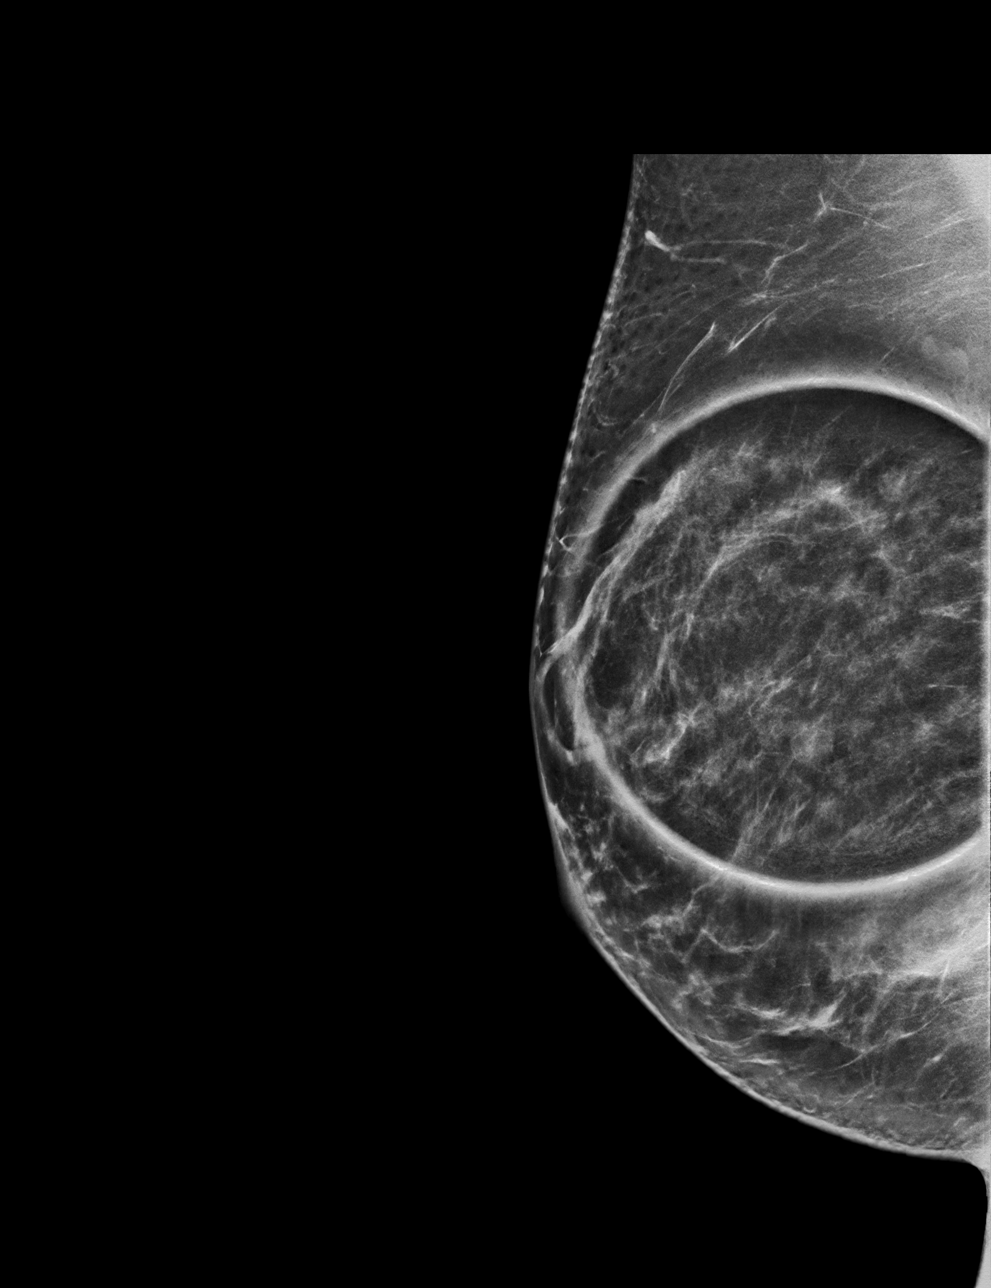

[R CC synth-2D]
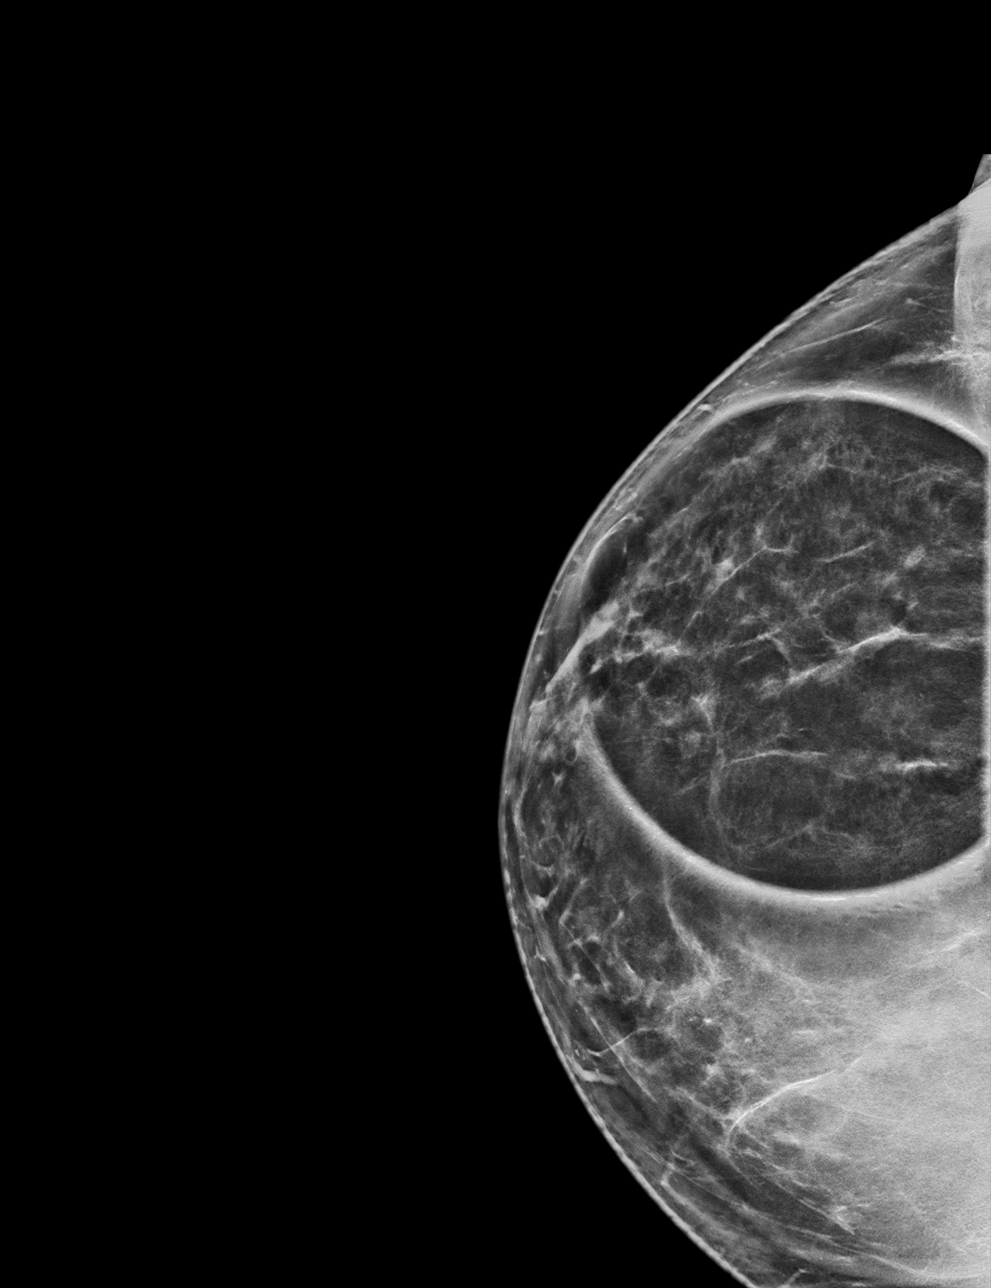

[R MLO tomo · tomo slice 43/85.0]
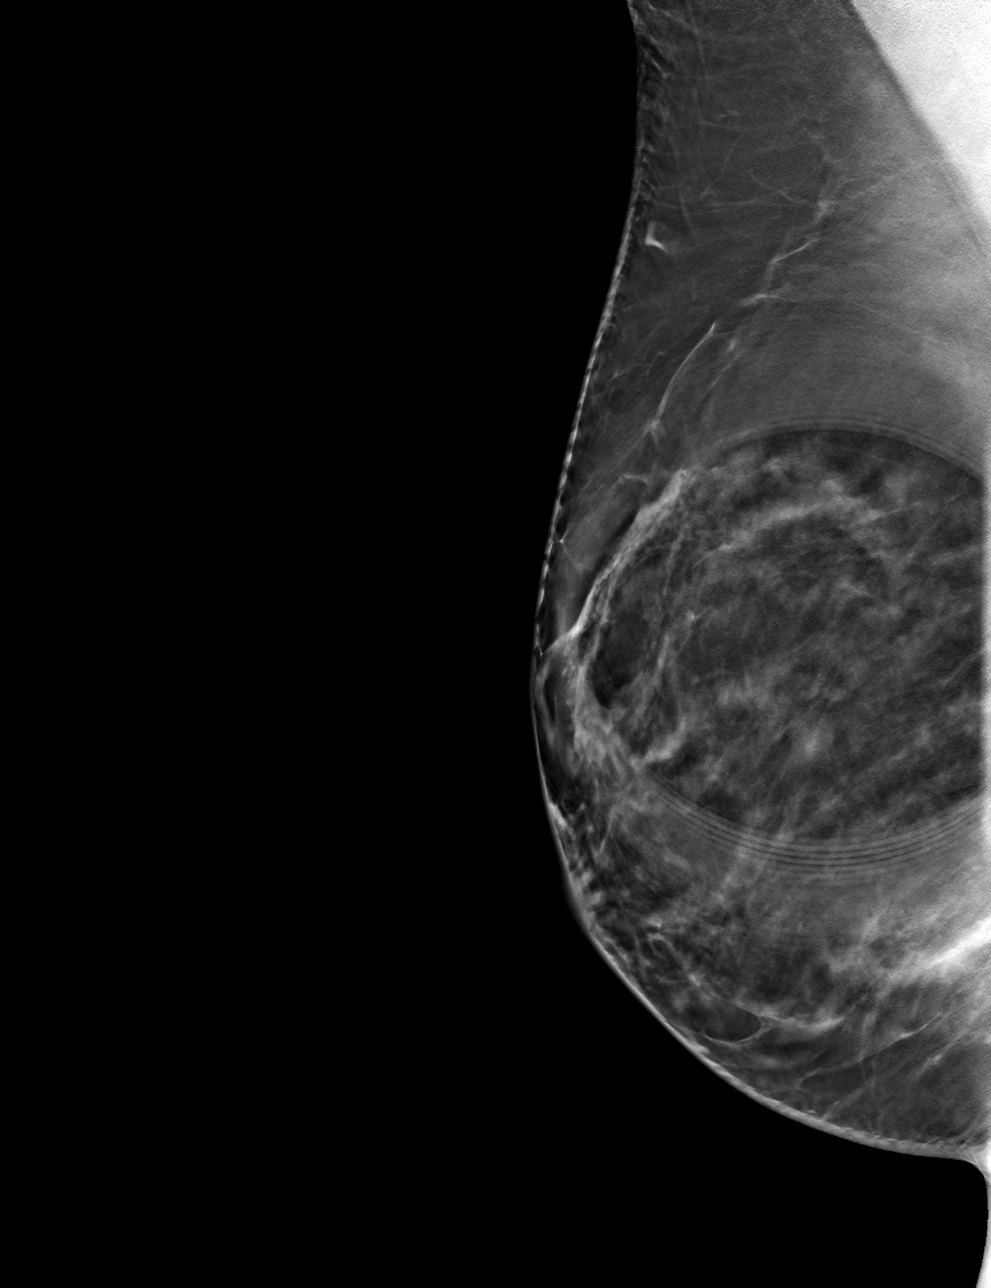

[R CC tomo · tomo slice 41/82.0]
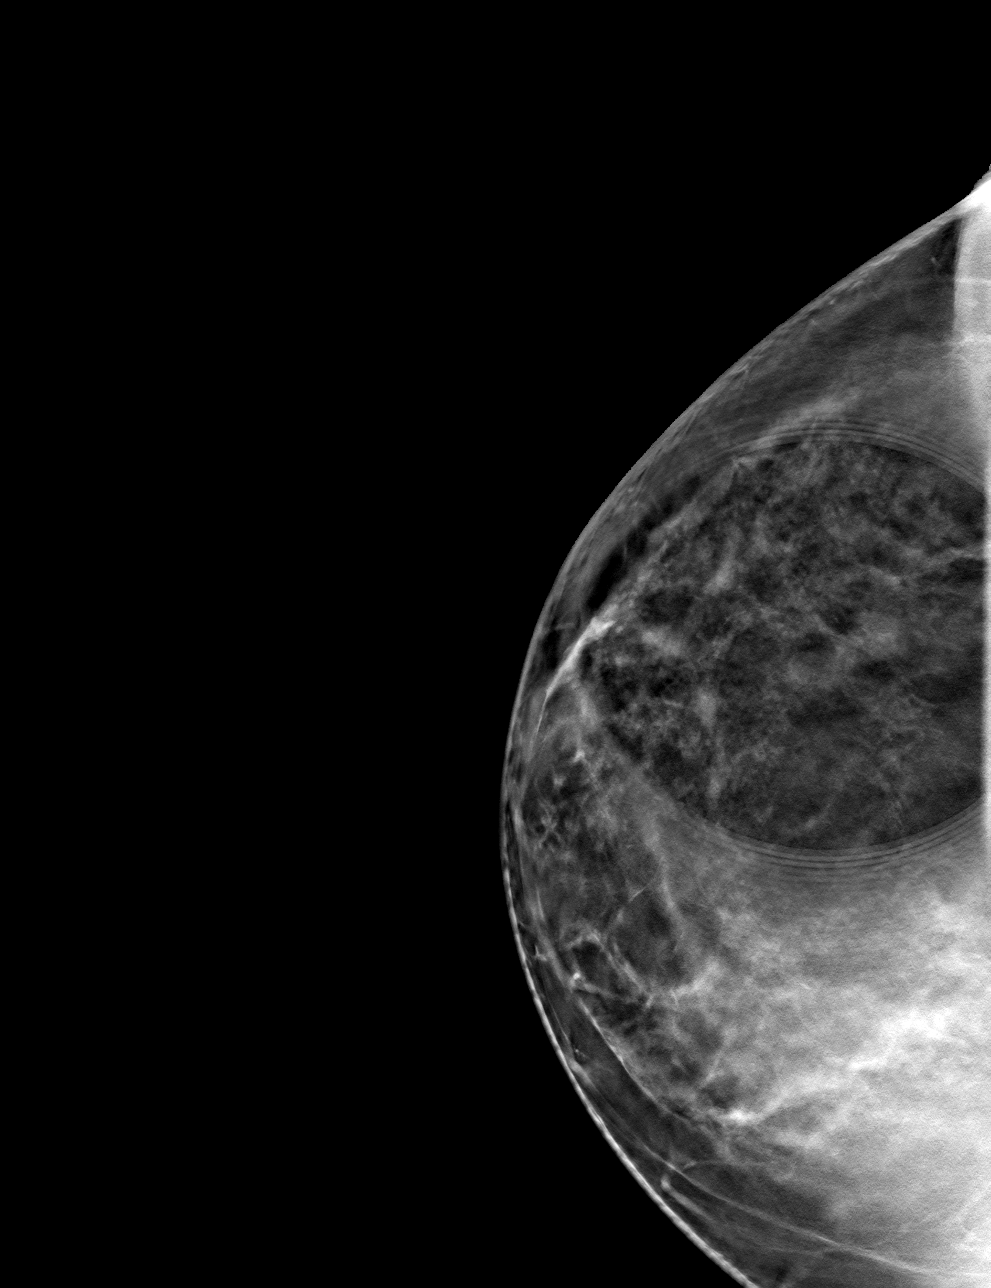

[4 of 12 positions shown; findings below may reference images not displayed]

ACR Breast Density Category b: There are scattered areas of
fibroglandular density.
FINDINGS: Additional imaging of the right breast was performed. There is
persistence of a well-circumscribed 8 mm mass in the lateral aspect
of the right breast.

On physical exam, I do not palpate a mass in the lateral aspect of
the right breast.

Targeted ultrasound is performed, showing a well-circumscribed near
anechoic mass in the right breast at [DATE] 3 cm from the nipple
measuring 8 x 5 x 7 mm. This is likely a benign cyst.
IMPRESSION: Probable benign cyst in the right breast.

RECOMMENDATION:
Short-term interval follow-up right breast ultrasound 6 months is
recommended.

I have discussed the findings and recommendations with the patient.
Results were also provided in writing at the conclusion of the
visit. If applicable, a reminder letter will be sent to the patient
regarding the next appointment.

BI-RADS CATEGORY  3: Probably benign.

## 2019-05-17 ENCOUNTER — Ambulatory Visit (INDEPENDENT_AMBULATORY_CARE_PROVIDER_SITE_OTHER): Payer: 59 | Admitting: Bariatrics

## 2019-05-17 ENCOUNTER — Other Ambulatory Visit: Payer: Self-pay

## 2019-05-17 ENCOUNTER — Encounter (INDEPENDENT_AMBULATORY_CARE_PROVIDER_SITE_OTHER): Payer: Self-pay | Admitting: Bariatrics

## 2019-05-17 VITALS — BP 135/62 | HR 69 | Temp 98.0°F | Ht 68.0 in | Wt 262.0 lb

## 2019-05-17 DIAGNOSIS — R7303 Prediabetes: Secondary | ICD-10-CM | POA: Diagnosis not present

## 2019-05-17 DIAGNOSIS — E559 Vitamin D deficiency, unspecified: Secondary | ICD-10-CM | POA: Diagnosis not present

## 2019-05-17 DIAGNOSIS — Z6839 Body mass index (BMI) 39.0-39.9, adult: Secondary | ICD-10-CM | POA: Diagnosis not present

## 2019-05-17 DIAGNOSIS — Z6841 Body Mass Index (BMI) 40.0 and over, adult: Secondary | ICD-10-CM | POA: Insufficient documentation

## 2019-05-17 NOTE — Progress Notes (Signed)
Chief Complaint:   OBESITY Darlene Mccormick is here to discuss her progress with her obesity treatment plan along with follow-up of her obesity related diagnoses. Darlene Mccormick is on the Category 3 Plan (modified by patient) and states she is following her eating plan approximately 60% of the time. Darlene Mccormick states she is swimming 30 minutes 3 times per week and walking 30 minutes 2 times per week.  Today's visit was #: 2 Starting weight: 261 lbs Starting date: 05/03/2019 Today's weight: 262 lbs Today's date: 05/17/2019 Total lbs lost to date: 0 Total lbs lost since last in-office visit: 0  Interim History: Darlene Mccormick is up 1 lb. She states that she is modifying her own diet.   Subjective:   Prediabetes. Darlene Mccormick has a diagnosis of prediabetes based on her elevated HgA1c and was informed this puts her at greater risk of developing diabetes. She continues to work on diet and exercise to decrease her risk of diabetes. She denies nausea or hypoglycemia.  Lab Results  Component Value Date   HGBA1C 5.7 (H) 05/03/2019   Lab Results  Component Value Date   INSULIN 16.2 05/03/2019   Vitamin D deficiency. Darlene Mccormick is taking OTC Vitamin D. Last Vitamin D level 43.0 on 05/03/2019.  Assessment/Plan:   Prediabetes. Darlene Mccormick will continue to work on weight loss, exercise, increasing healthy fats and protein, and decreasing simple carbohydrates to help decrease the risk of diabetes.   Vitamin D deficiency. Low Vitamin D level contributes to fatigue and are associated with obesity, breast, and colon cancer. She agrees to continue to take Vitamin D and will follow-up for routine testing of Vitamin D, at least 2-3 times per year to avoid over-replacement.  Class 2 severe obesity with serious comorbidity and body mass index (BMI) of 39.0 to 39.9 in adult, unspecified obesity type ([place]).  Darlene Mccormick is currently in the action stage of change. As such, her goal is to continue with weight loss efforts. She has agreed to the  Category 3 Plan and will journal 250-350 calories and 25+ grams of protein at breakfast or journal 1500 calories and 90 grams of protein.  She will work on meal planning and intentional eating.  We reviewed labs independently with the patient from 05/03/2019  including Vitamin D, A1c, insulin, and thyroid.  She will continue to use Noom.  Exercise goals: All adults should avoid inactivity. Some physical activity is better than none, and adults who participate in any amount of physical activity gain some health benefits.  Behavioral modification strategies: increasing lean protein intake, decreasing simple carbohydrates, increasing vegetables, increasing water intake, decreasing eating out, no skipping meals, meal planning and cooking strategies, keeping healthy foods in the home and planning for success.  Darlene Mccormick has agreed to follow-up with our clinic in 2 weeks. She was informed of the importance of frequent follow-up visits to maximize her success with intensive lifestyle modifications for her multiple health conditions.   Objective:   Blood pressure 135/62, pulse 69, temperature 98 F (36.7 C), height 5\' 8"  (1.727 m), weight 262 lb (118.8 kg), last menstrual period 05/09/2019, SpO2 99 %. Body mass index is 39.84 kg/m.  General: Cooperative, alert, well developed, in no acute distress. HEENT: Conjunctivae and lids unremarkable. Cardiovascular: Regular rhythm.  Lungs: Normal work of breathing. Neurologic: No focal deficits.   Lab Results  Component Value Date   CREATININE 0.66 11/25/2018   BUN 9 11/25/2018   NA 137 11/25/2018   K 4.4 11/25/2018   CL 101  11/25/2018   CO2 23 11/25/2018   Lab Results  Component Value Date   ALT 174 (H) 11/25/2018   AST 97 (H) 11/25/2018   ALKPHOS 57 02/16/2018   BILITOT 1.5 (H) 11/25/2018   Lab Results  Component Value Date   HGBA1C 5.7 (H) 05/03/2019   HGBA1C 5.7 01/24/2019   HGBA1C 5.6 11/26/2017   Lab Results  Component Value Date    INSULIN 16.2 05/03/2019   Lab Results  Component Value Date   TSH 1.060 05/03/2019   Lab Results  Component Value Date   CHOL 246 (H) 01/05/2019   HDL 40 (L) 11/25/2018   LDLCALC 193 (H) 11/25/2018   LDLDIRECT 166.0 11/26/2017   TRIG 296 (H) 01/05/2019   CHOLHDL 7.0 (H) 11/25/2018   Lab Results  Component Value Date   WBC 7.6 11/25/2018   HGB 12.9 11/25/2018   HCT 37.9 11/25/2018   MCV 87.3 11/25/2018   PLT 377 11/25/2018   Lab Results  Component Value Date   IRON 62 01/05/2019   FERRITIN 57.7 01/05/2019   Attestation Statements:   Reviewed by clinician on day of visit: allergies, medications, problem list, medical history, surgical history, family history, social history, and previous encounter notes.  Time spent on visit including pre-visit chart review and post-visit care was 30 minutes.   Migdalia Dk, am acting as Location manager for [employer], DO   I have reviewed the above documentation for accuracy and completeness, and I agree with the above. Jearld Lesch, DO

## 2019-06-07 ENCOUNTER — Other Ambulatory Visit: Payer: Self-pay

## 2019-06-07 ENCOUNTER — Ambulatory Visit (INDEPENDENT_AMBULATORY_CARE_PROVIDER_SITE_OTHER): Payer: 59 | Admitting: Family Medicine

## 2019-06-07 ENCOUNTER — Encounter (INDEPENDENT_AMBULATORY_CARE_PROVIDER_SITE_OTHER): Payer: Self-pay | Admitting: Family Medicine

## 2019-06-07 VITALS — BP 117/64 | HR 76 | Temp 98.3°F | Ht 68.0 in | Wt 261.0 lb

## 2019-06-07 DIAGNOSIS — Z6839 Body mass index (BMI) 39.0-39.9, adult: Secondary | ICD-10-CM | POA: Diagnosis not present

## 2019-06-07 DIAGNOSIS — R7303 Prediabetes: Secondary | ICD-10-CM

## 2019-06-07 DIAGNOSIS — E559 Vitamin D deficiency, unspecified: Secondary | ICD-10-CM

## 2019-06-07 NOTE — Progress Notes (Signed)
Chief Complaint:   OBESITY Darlene Mccormick is here to discuss her progress with her obesity treatment plan along with follow-up of her obesity related diagnoses. Darlene Mccormick is on the Category 3 Plan and keeping a food journal and adhering to recommended goals of 250-350 calories and 25+ grams of protein at breakfast daily or keeping a food journal and adhering to recommended goals of 1500 calories and 90 grams of protein daily and states she is following her eating plan approximately 70-75% of the time. [name] states she is swimming and walking for 30 minutes 3 times per week.  Today's visit was #: 3 Starting weight: 261 lbs Starting date: 05/03/2019 Today's weight: 261 lbs Today's date: 06/07/2019 Total lbs lost to date: 0 Total lbs lost since last in-office visit: 1  Interim History: Darlene Mccormick is journaling with Noom. She was off the plan with traveling this weekend. She enjoys choosing her own food and she prefers journaling. She is meeting her protein goal. She is currently working at home.  Subjective:   1. Pre-diabetes [name] denies polyphagia. She is on metformin BID, and she denies nausea or diarrhea. Lab Results  Component Value Date   HGBA1C 5.7 (H) 05/03/2019    2. Vitamin D deficiency Darlene Mccormick is on OTC Vit D (dose unknown), and her level is not quite at goal.  Assessment/Plan:   1. Pre-diabetes Darlene Mccormick agreed to continue metformin, no refill needed, and she will continue to work on weight loss, exercise, and decreasing simple carbohydrates to help decrease the risk of diabetes.   2. Vitamin D deficiency Low Vitamin D level contributes to fatigue and are associated with obesity, breast, and colon cancer. [name] agreed to continue taking OTC Vitamin D and will follow-up for routine testing of Vitamin D, at least 2-3 times per year to avoid over-replacement.  3. Class 2 severe obesity with serious comorbidity and body mass index (BMI) of 39.0 to 39.9 in adult, unspecified obesity type [facility]) Darlene Mccormick is  currently in the action stage of change. As such, her goal is to continue with weight loss efforts. She has agreed to keeping a food journal and adhering to recommended goals of 1500 calories and 90 grams of protein daily.   Handouts given: Protein content of food.  Exercise goals: As is.  Behavioral modification strategies: decreasing simple carbohydrates.  Darlene Mccormick has agreed to follow-up with our clinic in 2 weeks. She was informed of the importance of frequent follow-up visits to maximize her success with intensive lifestyle modifications for her multiple health conditions.   Objective:   Blood pressure 117/64, pulse 76, temperature 98.3 F (36.8 C), temperature source Oral, height 5\' 8"  (1.727 m), weight 261 lb (118.4 kg), last menstrual period 06/01/2019, SpO2 97 %. Body mass index is 39.68 kg/m.  General: Cooperative, alert, well developed, in no acute distress. HEENT: Conjunctivae and lids unremarkable. Cardiovascular: Regular rhythm.  Lungs: Normal work of breathing. Neurologic: No focal deficits.   Lab Results  Component Value Date   CREATININE 0.66 11/25/2018   BUN 9 11/25/2018   NA 137 11/25/2018   K 4.4 11/25/2018   CL 101 11/25/2018   CO2 23 11/25/2018   Lab Results  Component Value Date   ALT 174 (H) 11/25/2018   AST 97 (H) 11/25/2018   ALKPHOS 57 02/16/2018   BILITOT 1.5 (H) 11/25/2018   Lab Results  Component Value Date   HGBA1C 5.7 (H) 05/03/2019   HGBA1C 5.7 01/24/2019   HGBA1C 5.6 11/26/2017   Lab  Results  Component Value Date   INSULIN 16.2 05/03/2019   Lab Results  Component Value Date   TSH 1.060 05/03/2019   Lab Results  Component Value Date   CHOL 246 (H) 01/05/2019   HDL 40 (L) 11/25/2018   LDLCALC 193 (H) 11/25/2018   LDLDIRECT 166.0 11/26/2017   TRIG 296 (H) 01/05/2019   CHOLHDL 7.0 (H) 11/25/2018   Lab Results  Component Value Date   WBC 7.6 11/25/2018   HGB 12.9 11/25/2018   HCT 37.9 11/25/2018   MCV 87.3 11/25/2018   PLT  377 11/25/2018   Lab Results  Component Value Date   IRON 62 01/05/2019   FERRITIN 57.7 01/05/2019   Attestation Statements:   Reviewed by clinician on day of visit: allergies, medications, problem list, medical history, surgical history, family history, social history, and previous encounter notes.   Wilhemena Durie, am acting as Location manager for [employer], FNP-C.  I have reviewed the above documentation for accuracy and completeness, and I agree with the above. -  Georgianne Fick, FNP

## 2019-06-08 ENCOUNTER — Encounter (INDEPENDENT_AMBULATORY_CARE_PROVIDER_SITE_OTHER): Payer: Self-pay | Admitting: Family Medicine

## 2019-06-08 DIAGNOSIS — E119 Type 2 diabetes mellitus without complications: Secondary | ICD-10-CM | POA: Insufficient documentation

## 2019-06-08 DIAGNOSIS — R7303 Prediabetes: Secondary | ICD-10-CM | POA: Insufficient documentation

## 2019-06-20 ENCOUNTER — Other Ambulatory Visit: Payer: Self-pay | Admitting: Family Medicine

## 2019-06-22 ENCOUNTER — Ambulatory Visit (INDEPENDENT_AMBULATORY_CARE_PROVIDER_SITE_OTHER): Payer: 59 | Admitting: Family Medicine

## 2019-06-22 ENCOUNTER — Encounter (INDEPENDENT_AMBULATORY_CARE_PROVIDER_SITE_OTHER): Payer: Self-pay | Admitting: Family Medicine

## 2019-06-22 ENCOUNTER — Other Ambulatory Visit: Payer: Self-pay

## 2019-06-22 VITALS — BP 130/85 | HR 72 | Temp 97.9°F | Ht 68.0 in | Wt 260.0 lb

## 2019-06-22 DIAGNOSIS — R7303 Prediabetes: Secondary | ICD-10-CM | POA: Diagnosis not present

## 2019-06-22 DIAGNOSIS — Z6839 Body mass index (BMI) 39.0-39.9, adult: Secondary | ICD-10-CM

## 2019-06-26 ENCOUNTER — Encounter (INDEPENDENT_AMBULATORY_CARE_PROVIDER_SITE_OTHER): Payer: Self-pay | Admitting: Family Medicine

## 2019-06-26 NOTE — Progress Notes (Signed)
Chief Complaint:   OBESITY Darlene Mccormick is here to discuss her progress with her obesity treatment plan along with follow-up of her obesity related diagnoses. Darlene Mccormick is on keeping a food journal and adhering to recommended goals of 1500 calories and 90 grams of protein daily and states she is following her eating plan approximately 60% of the time. Darlene Mccormick states she is walking for 30 minutes 4 times per week.  Today's visit was #: 4 Starting weight: 261 lbs Starting date: 05/03/2019 Today's weight: 260 lbs Today's date: 06/22/2019 Total lbs lost to date: 1 Total lbs lost since last in-office visit: 1  Interim History: Darlene Mccormick recently traveled and is happy with a 1 lb weight loss. She is traveling again next week to the beach for a week. She is making better choices. She has not always gotten the protein in but meets the protein goals most days.  Subjective:   1. Pre-diabetes Darlene Mccormick denies polyphagia. She is on metformin and feels this helps with hunger. She denies nausea or diarrhea. Lab Results  Component Value Date   HGBA1C 5.7 (H) 05/03/2019    Assessment/Plan:   1. Pre-diabetes Darlene Mccormick agreed to continue metformin, and will continue to work on weight loss, exercise, and decreasing simple carbohydrates to help decrease the risk of diabetes.   2. Class 2 severe obesity with serious comorbidity and body mass index (BMI) of 39.0 to 39.9 in adult, unspecified obesity type [facility]) Darlene Mccormick is currently in the action stage of change. As such, her goal is to continue with weight loss efforts. She has agreed to keeping a food journal and adhering to recommended goals of 1300-1500 calories and 90 grams of protein daily.    Exercise goals: As is.  Behavioral modification strategies: decreasing simple carbohydrates and travel eating strategies.  Darlene Mccormick has agreed to follow-up with our clinic in 2 to 3 weeks. She was informed of the importance of frequent follow-up visits to maximize her success with intensive  lifestyle modifications for her multiple health conditions.   Objective:   Blood pressure 130/85, pulse 72, temperature 97.9 F (36.6 C), temperature source Oral, height 5\' 8"  (1.727 m), weight 260 lb (117.9 kg), last menstrual period 06/01/2019, SpO2 99 %. Body mass index is 39.53 kg/m.  General: Cooperative, alert, well developed, in no acute distress. HEENT: Conjunctivae and lids unremarkable. Cardiovascular: Regular rhythm.  Lungs: Normal work of breathing. Neurologic: No focal deficits.   Lab Results  Component Value Date   CREATININE 0.66 11/25/2018   BUN 9 11/25/2018   NA 137 11/25/2018   K 4.4 11/25/2018   CL 101 11/25/2018   CO2 23 11/25/2018   Lab Results  Component Value Date   ALT 174 (H) 11/25/2018   AST 97 (H) 11/25/2018   ALKPHOS 57 02/16/2018   BILITOT 1.5 (H) 11/25/2018   Lab Results  Component Value Date   HGBA1C 5.7 (H) 05/03/2019   HGBA1C 5.7 01/24/2019   HGBA1C 5.6 11/26/2017   Lab Results  Component Value Date   INSULIN 16.2 05/03/2019   Lab Results  Component Value Date   TSH 1.060 05/03/2019   Lab Results  Component Value Date   CHOL 246 (H) 01/05/2019   HDL 40 (L) 11/25/2018   LDLCALC 193 (H) 11/25/2018   LDLDIRECT 166.0 11/26/2017   TRIG 296 (H) 01/05/2019   CHOLHDL 7.0 (H) 11/25/2018   Lab Results  Component Value Date   WBC 7.6 11/25/2018   HGB 12.9 11/25/2018   HCT 37.9  11/25/2018   MCV 87.3 11/25/2018   PLT 377 11/25/2018   Lab Results  Component Value Date   IRON 62 01/05/2019   FERRITIN 57.7 01/05/2019   Attestation Statements:   Reviewed by clinician on day of visit: allergies, medications, problem list, medical history, surgical history, family history, social history, and previous encounter notes.   Wilhemena Durie, am acting as Location manager for [employer], FNP-C.  I have reviewed the above documentation for accuracy and completeness, and I agree with the above. -  Georgianne Fick, FNP

## 2019-07-13 ENCOUNTER — Other Ambulatory Visit: Payer: Self-pay

## 2019-07-13 ENCOUNTER — Encounter (INDEPENDENT_AMBULATORY_CARE_PROVIDER_SITE_OTHER): Payer: Self-pay | Admitting: Family Medicine

## 2019-07-13 ENCOUNTER — Ambulatory Visit (INDEPENDENT_AMBULATORY_CARE_PROVIDER_SITE_OTHER): Payer: 59 | Admitting: Family Medicine

## 2019-07-13 VITALS — BP 126/81 | HR 80 | Temp 98.0°F | Ht 68.0 in | Wt 264.0 lb

## 2019-07-13 DIAGNOSIS — R7303 Prediabetes: Secondary | ICD-10-CM | POA: Diagnosis not present

## 2019-07-13 DIAGNOSIS — Z6841 Body Mass Index (BMI) 40.0 and over, adult: Secondary | ICD-10-CM

## 2019-07-13 DIAGNOSIS — Z9189 Other specified personal risk factors, not elsewhere classified: Secondary | ICD-10-CM

## 2019-07-13 DIAGNOSIS — E782 Mixed hyperlipidemia: Secondary | ICD-10-CM | POA: Diagnosis not present

## 2019-07-13 MED ORDER — METFORMIN HCL 500 MG PO TABS: 500.0000 mg | ORAL_TABLET | Freq: Two times a day (BID) | ORAL | 0 refills | Status: DC

## 2019-07-17 NOTE — Progress Notes (Signed)
Chief Complaint:   OBESITY Darlene Mccormick is here to discuss her progress with her obesity treatment plan along with follow-up of her obesity related diagnoses. Darlene Mccormick is on keeping a food journal and adhering to recommended goals of 1300-1500 calories and 90 grams of protein daily and states she is following her eating plan approximately 40% of the time. Darlene Mccormick states she is swimming for 30-35 minutes 3 times per week.  Today's visit was #: 5 Starting weight: 261 lbs Starting date: 05/03/2019 Today's weight: 264 lbs Today's date: 07/13/2019 Total lbs lost to date: 0 Total lbs lost since last in-office visit: 0  Interim History: Darlene Mccormick notes being off the plan over the last few weeks due to recent vacation. She stayed on the plan to some extent at breakfast and lunch, and she ate out at dinner while on vacation. She is struggling to get back on the plan. She has not been meal prepping.  However, she does journal consistently with Noom.  Subjective:   1. Pre-diabetes Darlene Mccormick is on metformin BID. She notes polyphagia in the afternoon. Last A1c was 5.7.  2. Hyperlipidemia, mixed Darlene Mccormick is not on statin. Last LDL was 193 (high), HDL was low at 40, and triglycerides at 296. Her ASCVD risk score is 1.7%.  3. At risk for heart disease Darlene Mccormick is at a higher than average risk for cardiovascular disease due to obesity, prediabetes, and HLD.  Assessment/Plan:   1. Pre-diabetes Darlene Mccormick will continue to work on weight loss, exercise, and decreasing simple carbohydrates to help decrease the risk of diabetes. We will refill metformin for 90 days with no refills.  - metFORMIN (GLUCOPHAGE) 500 MG tablet; Take 1 tablet (500 mg total) by mouth 2 (two) times daily with a meal.  Dispense: 180 tablet; Refill: 0  2. Hyperlipidemia, mixed Cardiovascular risk and specific lipid/LDL goals reviewed. We discussed several lifestyle modifications today. Darlene Mccormick will continue her meal plan, and will continue to work on exercise and  weight loss efforts.  3. At risk for heart disease Darlene Mccormick was given approximately 15 minutes of coronary artery disease prevention counseling today. She is 44 y.o. female and has risk factors for heart disease including obesity. We discussed intensive lifestyle modifications today with an emphasis on specific weight loss instructions and strategies.   Repetitive spaced learning was employed today to elicit superior memory formation and behavioral change.  4. Class 3 severe obesity with serious comorbidity and body mass index (BMI) of 40.0 to 44.9 in adult, unspecified obesity type [facility]) Darlene Mccormick is currently in the action stage of change. As such, her goal is to continue with weight loss efforts. She has agreed to keeping a food journal and adhering to recommended goals of 1300-1500 calories and 90 grams of protein daily.   Exercise goals: As is.  Behavioral modification strategies: meal planning and cooking strategies.  Darlene Mccormick has agreed to follow-up with our clinic in 2 weeks. She was informed of the importance of frequent follow-up visits to maximize her success with intensive lifestyle modifications for her multiple health conditions.   Objective:   Blood pressure 126/81, pulse 80, temperature 98 F (36.7 C), temperature source Oral, height 5\' 8"  (1.727 m), weight 264 lb (119.7 kg), last menstrual period 06/28/2019, SpO2 96 %. Body mass index is 40.14 kg/m.  General: Cooperative, alert, well developed, in no acute distress. HEENT: Conjunctivae and lids unremarkable. Cardiovascular: Regular rhythm.  Lungs: Normal work of breathing. Neurologic: No focal deficits.   Lab Results  Component  Value Date   CREATININE 0.66 11/25/2018   BUN 9 11/25/2018   NA 137 11/25/2018   K 4.4 11/25/2018   CL 101 11/25/2018   CO2 23 11/25/2018   Lab Results  Component Value Date   ALT 174 (H) 11/25/2018   AST 97 (H) 11/25/2018   ALKPHOS 57 02/16/2018   BILITOT 1.5 (H) 11/25/2018   Lab Results    Component Value Date   HGBA1C 5.7 (H) 05/03/2019   HGBA1C 5.7 01/24/2019   HGBA1C 5.6 11/26/2017   Lab Results  Component Value Date   INSULIN 16.2 05/03/2019   Lab Results  Component Value Date   TSH 1.060 05/03/2019   Lab Results  Component Value Date   CHOL 246 (H) 01/05/2019   HDL 40 (L) 11/25/2018   LDLCALC 193 (H) 11/25/2018   LDLDIRECT 166.0 11/26/2017   TRIG 296 (H) 01/05/2019   CHOLHDL 7.0 (H) 11/25/2018   Lab Results  Component Value Date   WBC 7.6 11/25/2018   HGB 12.9 11/25/2018   HCT 37.9 11/25/2018   MCV 87.3 11/25/2018   PLT 377 11/25/2018   Lab Results  Component Value Date   IRON 62 01/05/2019   FERRITIN 57.7 01/05/2019   Attestation Statements:   Reviewed by clinician on day of visit: allergies, medications, problem list, medical history, surgical history, family history, social history, and previous encounter notes.   Wilhemena Durie, am acting as Location manager for [employer], FNP-C.  I have reviewed the above documentation for accuracy and completeness, and I agree with the above. -  Georgianne Fick, FNP

## 2019-07-19 ENCOUNTER — Other Ambulatory Visit: Payer: Self-pay | Admitting: Family Medicine

## 2019-07-19 DIAGNOSIS — R7303 Prediabetes: Secondary | ICD-10-CM

## 2019-07-31 ENCOUNTER — Telehealth: Payer: Self-pay

## 2019-07-31 NOTE — Telephone Encounter (Signed)
Called and LM for pt to call back to discuss scheduling of Elastography at [facility] on 5-18, labs (FibroSURE) and OV with Dr. Tarri Glenn 2 weeks later, around June 1.

## 2019-07-31 NOTE — Telephone Encounter (Signed)
-----   Message from Wyline Beady, [place] sent at 01/24/2019  9:01 AM EDT ----- Patient to have labs/US and 2 weeks follow up after.

## 2019-07-31 NOTE — Telephone Encounter (Signed)
Scheduled pt for Elastography U/S of liver at [facility] on Tuesday, 5-18 at 8:00am, NPO after midnight. Pt  Will need to go to the lab for FIbroSURE. Will need  an OV with Dr. Tarri Glenn 2 weeks after Elastography.

## 2019-08-03 NOTE — Telephone Encounter (Signed)
Called and left detailed message with patient regarding U/S elastography at [facility] on Tuesday 5-18.  She needs to go to the lab as well.  She is currently scheduled for an OV with Dr. Tarri Glenn on 6-1 on 8:30am.

## 2019-08-04 ENCOUNTER — Encounter (INDEPENDENT_AMBULATORY_CARE_PROVIDER_SITE_OTHER): Payer: Self-pay | Admitting: Family Medicine

## 2019-08-04 NOTE — Telephone Encounter (Signed)
Patient provided the number to central scheduling [phone]

## 2019-08-04 NOTE — Telephone Encounter (Signed)
Pt returned call, she wants to r/s [place] for the week of June 21st. I r/s her f/u appt with Dr. Tarri Glenn for 7/8 at 2:10pm. Pls call pt with new appt for [place].

## 2019-08-07 ENCOUNTER — Ambulatory Visit (INDEPENDENT_AMBULATORY_CARE_PROVIDER_SITE_OTHER): Payer: 59 | Admitting: Family Medicine

## 2019-08-09 ENCOUNTER — Ambulatory Visit (INDEPENDENT_AMBULATORY_CARE_PROVIDER_SITE_OTHER): Payer: 59 | Admitting: Family Medicine

## 2019-08-15 ENCOUNTER — Ambulatory Visit (HOSPITAL_COMMUNITY): Payer: 59

## 2019-08-23 ENCOUNTER — Ambulatory Visit (INDEPENDENT_AMBULATORY_CARE_PROVIDER_SITE_OTHER): Payer: 59 | Admitting: Family Medicine

## 2019-08-23 ENCOUNTER — Encounter (INDEPENDENT_AMBULATORY_CARE_PROVIDER_SITE_OTHER): Payer: Self-pay | Admitting: Family Medicine

## 2019-08-23 ENCOUNTER — Other Ambulatory Visit: Payer: Self-pay

## 2019-08-23 VITALS — BP 119/80 | HR 72 | Temp 98.3°F | Ht 68.0 in | Wt 261.0 lb

## 2019-08-23 DIAGNOSIS — E559 Vitamin D deficiency, unspecified: Secondary | ICD-10-CM

## 2019-08-23 DIAGNOSIS — E7849 Other hyperlipidemia: Secondary | ICD-10-CM | POA: Diagnosis not present

## 2019-08-23 DIAGNOSIS — R7303 Prediabetes: Secondary | ICD-10-CM

## 2019-08-23 DIAGNOSIS — Z6839 Body mass index (BMI) 39.0-39.9, adult: Secondary | ICD-10-CM

## 2019-08-23 NOTE — Progress Notes (Signed)
Chief Complaint:   OBESITY Darlene Mccormick is here to discuss her progress with her obesity treatment plan along with follow-up of her obesity related diagnoses. [name] is on keeping a food journal and adhering to recommended goals of 1300-1500 calories and 90 grams of protein daily and states she is following her eating plan approximately 70% of the time. [name] states she is swimming laps and walking for 30 minutes 3 times per week.  Today's visit was #: 6 Starting weight: 261 lbs Starting date: 05/03/2019 Today's weight: 261 lbs Today's date: 08/23/2019 Total lbs lost to date: 0 Total lbs lost since last in-office visit: 3  Interim History: Darlene Mccormick is journaling consistently with Noom. She is averaging between 70 and 80 grams of protein daily. She does exceed calorie goals sometimes. She is taking a trip to [place] in a few weeks.  Subjective:   1. Pre-diabetes Darlene Mccormick has a diagnosis of pre-diabetes based on her elevated Hgb A1c and was informed this puts her at greater risk of developing diabetes. [name] notes polyphagia at times. She is on metformin BID. She continues to work on diet and exercise to decrease her risk of diabetes. She denies nausea or hypoglycemia.  Lab Results  Component Value Date   HGBA1C 5.7 (H) 05/03/2019   Lab Results  Component Value Date   INSULIN 16.2 05/03/2019   2. Other hyperlipidemia [name] has hyperlipidemia and has been trying to improve her cholesterol levels with intensive lifestyle modification including a low saturated fat diet, exercise and weight loss. Darlene Mccormick's last LDL was 173 (elevated), HDL was low at 40, and triglycerides were high at 296. She is not on statin.   Lab Results  Component Value Date   ALT 174 (H) 11/25/2018   AST 97 (H) 11/25/2018   ALKPHOS 57 02/16/2018   BILITOT 1.5 (H) 11/25/2018   Lab Results  Component Value Date   CHOL 246 (H) 01/05/2019   HDL 40 (L) 11/25/2018   LDLCALC 193 (H) 11/25/2018   LDLDIRECT 166.0 11/26/2017   TRIG 296  (H) 01/05/2019   CHOLHDL 7.0 (H) 11/25/2018   3. Vitamin D deficiency Darlene Mccormick is on 5,000 IU OTC Vit D BID. Last Vit D level was 43.  Assessment/Plan:   1. Pre-diabetes [name] will continue metformin, and will continue to work on weight loss, exercise, and decreasing simple carbohydrates to help decrease the risk of diabetes. We will check labs today.  - Hemoglobin A1c - Insulin, random  2. Other hyperlipidemia Cardiovascular risk and specific lipid/LDL goals reviewed. We discussed several lifestyle modifications today and [name] will continue to work on diet, exercise and weight loss efforts. We will check labs today. d.  - Comprehensive metabolic panel - Lipid Panel With LDL/HDL Ratio  3. Vitamin D deficiency Low Vitamin D level contributes to fatigue and are associated with obesity, breast, and colon cancer. We will check labs today. Darlene Mccormick will follow-up for routine testing of Vitamin D, at least 2-3 times per year to avoid over-replacement.  - VITAMIN D 25 Hydroxy (Vit-D Deficiency, Fractures)  4. Class 2 severe obesity with serious comorbidity and body mass index (BMI) of 39.0 to 39.9 in adult, unspecified obesity type [facility]) Darlene Mccormick is currently in the action stage of change. As such, her goal is to continue with weight loss efforts. She has agreed to keeping a food journal and adhering to recommended goals of 1300-1500 calories and 90 grams of protein daily.   Exercise goals: As is.  Behavioral modification strategies:  increasing lean protein intake, decreasing simple carbohydrates, dealing with family or coworker sabotage and travel eating strategies.  Darlene Mccormick has agreed to follow-up with our clinic in 4 weeks. She was informed of the importance of frequent follow-up visits to maximize her success with intensive lifestyle modifications for her multiple health conditions.   Darlene Mccormick was informed we would discuss her lab results at her next visit unless there is a critical issue that needs to be  addressed sooner. Darlene Mccormick agreed to keep her next visit at the agreed upon time to discuss these results.  Objective:   Blood pressure 119/80, pulse 72, temperature 98.3 F (36.8 C), temperature source Oral, height 5\' 8"  (1.727 m), weight 261 lb (118.4 kg), SpO2 97 %. Body mass index is 39.68 kg/m.  General: Cooperative, alert, well developed, in no acute distress. HEENT: Conjunctivae and lids unremarkable. Cardiovascular: Regular rhythm.  Lungs: Normal work of breathing. Neurologic: No focal deficits.   Lab Results  Component Value Date   CREATININE 0.66 11/25/2018   BUN 9 11/25/2018   NA 137 11/25/2018   K 4.4 11/25/2018   CL 101 11/25/2018   CO2 23 11/25/2018   Lab Results  Component Value Date   ALT 174 (H) 11/25/2018   AST 97 (H) 11/25/2018   ALKPHOS 57 02/16/2018   BILITOT 1.5 (H) 11/25/2018   Lab Results  Component Value Date   HGBA1C 5.7 (H) 05/03/2019   HGBA1C 5.7 01/24/2019   HGBA1C 5.6 11/26/2017   Lab Results  Component Value Date   INSULIN 16.2 05/03/2019   Lab Results  Component Value Date   TSH 1.060 05/03/2019   Lab Results  Component Value Date   CHOL 246 (H) 01/05/2019   HDL 40 (L) 11/25/2018   LDLCALC 193 (H) 11/25/2018   LDLDIRECT 166.0 11/26/2017   TRIG 296 (H) 01/05/2019   CHOLHDL 7.0 (H) 11/25/2018   Lab Results  Component Value Date   WBC 7.6 11/25/2018   HGB 12.9 11/25/2018   HCT 37.9 11/25/2018   MCV 87.3 11/25/2018   PLT 377 11/25/2018   Lab Results  Component Value Date   IRON 62 01/05/2019   FERRITIN 57.7 01/05/2019   Attestation Statements:   Reviewed by clinician on day of visit: allergies, medications, problem list, medical history, surgical history, family history, social history, and previous encounter notes.   Wilhemena Durie, am acting as Location manager for [employer], FNP-C.  I have reviewed the above documentation for accuracy and completeness, and I agree with the above. -  Georgianne Fick, FNP

## 2019-08-24 LAB — COMPREHENSIVE METABOLIC PANEL
ALT: 45 IU/L — ABNORMAL HIGH (ref 0–32)
AST: 34 IU/L (ref 0–40)
Albumin/Globulin Ratio: 1.8 (ref 1.2–2.2)
Albumin: 4.6 g/dL (ref 3.8–4.8)
Alkaline Phosphatase: 69 IU/L (ref 48–121)
BUN/Creatinine Ratio: 13 (ref 9–23)
BUN: 8 mg/dL (ref 6–24)
Bilirubin Total: 1 mg/dL (ref 0.0–1.2)
CO2: 20 mmol/L (ref 20–29)
Calcium: 9.1 mg/dL (ref 8.7–10.2)
Chloride: 100 mmol/L (ref 96–106)
Creatinine, Ser: 0.63 mg/dL (ref 0.57–1.00)
GFR calc Af Amer: 128 mL/min/{1.73_m2} (ref >59)
GFR calc non Af Amer: 111 mL/min/{1.73_m2} (ref >59)
Globulin, Total: 2.5 g/dL (ref 1.5–4.5)
Glucose: 97 mg/dL (ref 65–99)
Potassium: 4.7 mmol/L (ref 3.5–5.2)
Sodium: 140 mmol/L (ref 134–144)
Total Protein: 7.1 g/dL (ref 6.0–8.5)

## 2019-08-24 LAB — LIPID PANEL WITH LDL/HDL RATIO
Cholesterol, Total: 223 mg/dL — ABNORMAL HIGH (ref 100–199)
HDL: 40 mg/dL (ref >39)
LDL Chol Calc (NIH): 145 mg/dL — ABNORMAL HIGH (ref 0–99)
LDL/HDL Ratio: 3.6 ratio — ABNORMAL HIGH (ref 0.0–3.2)
Triglycerides: 209 mg/dL — ABNORMAL HIGH (ref 0–149)
VLDL Cholesterol Cal: 38 mg/dL (ref 5–40)

## 2019-08-24 LAB — INSULIN, RANDOM: INSULIN: 12.7 u[IU]/mL (ref 2.6–24.9)

## 2019-08-24 LAB — HEMOGLOBIN A1C
Est. average glucose Bld gHb Est-mCnc: 114 mg/dL
Hgb A1c MFr Bld: 5.6 % (ref 4.8–5.6)

## 2019-08-24 LAB — VITAMIN D 25 HYDROXY (VIT D DEFICIENCY, FRACTURES): Vit D, 25-Hydroxy: 63.1 ng/mL (ref 30.0–100.0)

## 2019-08-29 ENCOUNTER — Ambulatory Visit: Payer: 59 | Admitting: Gastroenterology

## 2019-09-19 ENCOUNTER — Encounter (INDEPENDENT_AMBULATORY_CARE_PROVIDER_SITE_OTHER): Payer: Self-pay | Admitting: Family Medicine

## 2019-09-19 ENCOUNTER — Ambulatory Visit (INDEPENDENT_AMBULATORY_CARE_PROVIDER_SITE_OTHER): Payer: 59 | Admitting: Family Medicine

## 2019-09-19 ENCOUNTER — Other Ambulatory Visit: Payer: Self-pay

## 2019-09-19 VITALS — BP 115/76 | HR 81 | Temp 97.8°F | Ht 68.0 in | Wt 264.0 lb

## 2019-09-19 DIAGNOSIS — E7849 Other hyperlipidemia: Secondary | ICD-10-CM | POA: Diagnosis not present

## 2019-09-19 DIAGNOSIS — K760 Fatty (change of) liver, not elsewhere classified: Secondary | ICD-10-CM | POA: Diagnosis not present

## 2019-09-19 DIAGNOSIS — Z6841 Body Mass Index (BMI) 40.0 and over, adult: Secondary | ICD-10-CM

## 2019-09-19 DIAGNOSIS — E559 Vitamin D deficiency, unspecified: Secondary | ICD-10-CM

## 2019-09-19 DIAGNOSIS — E8881 Metabolic syndrome: Secondary | ICD-10-CM | POA: Diagnosis not present

## 2019-09-19 NOTE — Progress Notes (Signed)
The 10-year ASCVD risk score Mikey Bussing DC Brooke Bonito., et al., 2013) is: 1.2%   Values used to calculate the score:     Age: 44 years     Sex: Female     Is Non-Hispanic African American: No     Diabetic: No     Tobacco smoker: No     Systolic Blood Pressure: 836 mmHg     Is BP treated: No     HDL Cholesterol: 40 mg/dL     Total Cholesterol: 223 mg/dL

## 2019-09-21 NOTE — Progress Notes (Signed)
Chief Complaint:   OBESITY Darlene Mccormick is here to discuss her progress with her obesity treatment plan along with follow-up of her obesity related diagnoses. Darlene Mccormick is on keeping a food journal and adhering to recommended goals of 1300-1500 calories and 90 grams of protein daily and states she is following her eating plan approximately 60% of the time. Darlene Mccormick states she is swimming and walking for 30 minutes 2-3 times per week.  Today's visit was #: 7 Starting weight: 261 lbs Starting date: 05/03/2019 Today's weight: 264 lbs Today's date: 09/19/2019 Total lbs lost to date: 0 Total lbs lost since last in-office visit: 0  Interim History: Darlene Mccormick recently went on vacation but she still jounaled her food intake. She is journaling daily. She is going over on calories, and her protein is a bit under goal at 60 or 70 grams daily. She is not getting protein in at breakfast. She does go out to eat some.  Subjective:   1. Insulin resistance Darlene Mccormick has a diagnosis of insulin resistance based on her elevated fasting insulin level >5. She had insulin checked at her GI in October 2020 and it was 25, it is now 12.7 (improved). She is on metformin BID. She has fallen out of pre-diabetes range. She continues to work on diet and exercise to decrease her risk of diabetes. I discussed labs with the patient today.  Lab Results  Component Value Date   INSULIN 12.7 08/23/2019   INSULIN 16.2 05/03/2019   Lab Results  Component Value Date   HGBA1C 5.6 08/23/2019   2. NAFLD (nonalcoholic fatty liver disease) Darlene Mccormick's liver enzymes have improved. Her AST was 97 in August 2020 and is now 43. Her ALT was 174 and is now 49. She has labs which are not in [place]. I discussed labs with the patient today.  3. Vitamin D deficiency Darlene Mccormick's Vit D level is within normal limits. She is taking 10,000 IU daily. I discussed labs with the patient today.  4. Other hyperlipidemia Darlene Mccormick has hyperlipidemia and has been trying to improve her  cholesterol levels with intensive lifestyle modification including a low saturated fat diet, exercise and weight loss. Her LDL is high at 145, but has decreased from 193 in August 2020. Her triglycerides are reduced to 209 from 256, and HDL is low at 50. Her ASCVD 10 year risk factor is 1.2%. She denies any chest pain, claudication or myalgias. I discussed labs with the patient today.  Lab Results  Component Value Date   ALT 45 (H) 08/23/2019   AST 34 08/23/2019   ALKPHOS 69 08/23/2019   BILITOT 1.0 08/23/2019   Lab Results  Component Value Date   CHOL 223 (H) 08/23/2019   HDL 40 08/23/2019   LDLCALC 145 (H) 08/23/2019   LDLDIRECT 166.0 11/26/2017   TRIG 209 (H) 08/23/2019   CHOLHDL 7.0 (H) 11/25/2018   Assessment/Plan:   1. Insulin resistance Darlene Mccormick will continue metformin, and will continue to work on weight loss, exercise, and decreasing simple carbohydrates to help decrease the risk of diabetes. Darlene Mccormick agreed to follow-up with [place] as directed to closely monitor her progress.  2. NAFLD (nonalcoholic fatty liver disease) Darlene Mccormick was educated the importance of weight loss. Darlene Mccormick agreed to continue with her meal plan, weight loss efforts with healthier diet and exercise as an essential part of her treatment plan.  3. Vitamin D deficiency Low Vitamin D level contributes to fatigue and are associated with obesity, breast, and colon cancer. Darlene Mccormick will  continue OTC Vit D 10,000 IU daily and will follow-up for routine testing of Vitamin D, at least 2-3 times per year to avoid over-replacement.  4. Other hyperlipidemia Cardiovascular risk and specific lipid/LDL goals reviewed. We discussed several lifestyle modifications today. Darlene Mccormick will continue her meal plan, no statin indicated, and will continue to work on exercise and weight loss efforts.   5. Class 3 severe obesity with serious comorbidity and body mass index (BMI) of 40.0 to 44.9 in adult, unspecified obesity type [facility]) Darlene Mccormick is currently in  the action stage of change. As such, her goal is to continue with weight loss efforts. She has agreed to keeping a food journal and adhering to recommended goals of 1300-1500 calories and 90 grams of protein daily.   Exercise goals: As is.  Behavioral modification strategies: increasing lean protein intake, decreasing simple carbohydrates, decreasing eating out and meal planning and cooking strategies.  Darlene Mccormick has agreed to follow-up with our clinic in 3 weeks. She was informed of the importance of frequent follow-up visits to maximize her success with intensive lifestyle modifications for her multiple health conditions.   Objective:   Blood pressure 115/76, pulse 81, temperature 97.8 F (36.6 C), temperature source Oral, height 5\' 8"  (1.727 m), weight 264 lb (119.7 kg), SpO2 97 %. Body mass index is 40.14 kg/m.  General: Cooperative, alert, well developed, in no acute distress. HEENT: Conjunctivae and lids unremarkable. Cardiovascular: Regular rhythm.  Lungs: Normal work of breathing. Neurologic: No focal deficits.   Lab Results  Component Value Date   CREATININE 0.63 08/23/2019   BUN 8 08/23/2019   NA 140 08/23/2019   K 4.7 08/23/2019   CL 100 08/23/2019   CO2 20 08/23/2019   Lab Results  Component Value Date   ALT 45 (H) 08/23/2019   AST 34 08/23/2019   ALKPHOS 69 08/23/2019   BILITOT 1.0 08/23/2019   Lab Results  Component Value Date   HGBA1C 5.6 08/23/2019   HGBA1C 5.7 (H) 05/03/2019   HGBA1C 5.7 01/24/2019   HGBA1C 5.6 11/26/2017   Lab Results  Component Value Date   INSULIN 12.7 08/23/2019   INSULIN 16.2 05/03/2019   Lab Results  Component Value Date   TSH 1.060 05/03/2019   Lab Results  Component Value Date   CHOL 223 (H) 08/23/2019   HDL 40 08/23/2019   LDLCALC 145 (H) 08/23/2019   LDLDIRECT 166.0 11/26/2017   TRIG 209 (H) 08/23/2019   CHOLHDL 7.0 (H) 11/25/2018   Lab Results  Component Value Date   WBC 7.6 11/25/2018   HGB 12.9 11/25/2018    HCT 37.9 11/25/2018   MCV 87.3 11/25/2018   PLT 377 11/25/2018   Lab Results  Component Value Date   IRON 62 01/05/2019   FERRITIN 57.7 01/05/2019   Attestation Statements:   Reviewed by clinician on day of visit: allergies, medications, problem list, medical history, surgical history, family history, social history, and previous encounter notes.   Wilhemena Durie, am acting as Location manager for [employer], FNP-C.  I have reviewed the above documentation for accuracy and completeness, and I agree with the above. -  Georgianne Fick, FNP

## 2019-09-25 ENCOUNTER — Encounter (INDEPENDENT_AMBULATORY_CARE_PROVIDER_SITE_OTHER): Payer: Self-pay | Admitting: Family Medicine

## 2019-09-25 DIAGNOSIS — E8881 Metabolic syndrome: Secondary | ICD-10-CM | POA: Insufficient documentation

## 2019-09-25 DIAGNOSIS — K760 Fatty (change of) liver, not elsewhere classified: Secondary | ICD-10-CM | POA: Insufficient documentation

## 2019-09-26 ENCOUNTER — Ambulatory Visit (HOSPITAL_COMMUNITY)
Admission: RE | Admit: 2019-09-26 | Discharge: 2019-09-26 | Disposition: A | Discharge status: Home / Self Care | Payer: 59 | Source: Ambulatory Visit | Attending: Gastroenterology | Admitting: Gastroenterology

## 2019-09-26 DIAGNOSIS — K8020 Calculus of gallbladder without cholecystitis without obstruction: Secondary | ICD-10-CM | POA: Insufficient documentation

## 2019-09-26 DIAGNOSIS — R748 Abnormal levels of other serum enzymes: Secondary | ICD-10-CM

## 2019-10-05 ENCOUNTER — Encounter: Payer: Self-pay | Admitting: Gastroenterology

## 2019-10-05 ENCOUNTER — Ambulatory Visit: Payer: 59 | Admitting: Gastroenterology

## 2019-10-05 VITALS — BP 122/78 | HR 78 | Ht 68.4 in | Wt 268.5 lb

## 2019-10-05 DIAGNOSIS — K7581 Nonalcoholic steatohepatitis (NASH): Secondary | ICD-10-CM | POA: Diagnosis not present

## 2019-10-05 NOTE — Progress Notes (Signed)
Referring Provider: Caren Macadam, MD Primary Care Physician:  Caren Macadam, MD  Chief complaint: Abnormal liver enzymes  IMPRESSION:  NASH with associated insulin resistance (HOMA-IR 7.3)    - Elevated transaminases x 5 years, ALT>AST    - Echogenic liver on ultrasound    - NASH FibroSURE F0, S3, NASH score 0.5 suggesting N1-borderline or probable NASH    - Elastography 09/26/19:  Suspected fatty infiltration of liver. Cholelithiasis. Median kPa:  5.1. ANA 1:40 Isolated low IgM with normal IgG Asymptomatic 2.8 cm solitary gallstone BMI 40 Hyperlipidemia Vitamin D deficiency Brother with Gilbert's disease No known family history of colon cancer or polyps  Labs and now elastography show suspected NASH without significant fibrosis. She is focused on working towards a healthy weight, continuing to exercise regularly, and maximizing control of insulin resistance and cholesterol abnormalities. In particular, I recommended that she avoid high fructose corn syrup and other processed foods. Hopefully, medical therapy will be available in the future. Will plan close monitoring for disease progression with at least an annual visit.   Isolated low IgM level identified. 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 Will repeat the IgM at the time of her next visit.   PLAN: - Reviewed current management of NASH - Follow-up in 12 months, 2 weeks after follow-up FibroSURE and elastography - Obtain IgM at time of follow-up - Start colon cancer screening at age 73  Please see the "Patient Instructions" section for addition details about the plan.  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 She returns in scheduled follow-up.   The interval history is obtained to the patient and review of her electronic  health record.  She has anxiety, hyperlipidemia and prior hypertension. She has started working with [employer] [facility]. Although she hs not yet lost any weight, she is pleased with other markers of improvement including her liver enzymes. She returns today to review her ultrasound/elastography result. She has not new complaints or concerns. She has noticed an improvement in energy with her diet changes and frequent swimming.   Labs - 08/23/2013: AST 46, ALT 81, alk phos 53, total bilirubin 1.4 - 01/01/2016: AST 42, ALT 69, alk phos 67 - 04/22/2016: AST 31, ALT 40, alk phos 67, total bilirubin 1.2 - 11/26/2017: AST 66, ALT 105, alk phos 67, total bilirubin 1.6, albumin 4.6 - 02/16/2018: AST 42, ALT 69, alk phos 57, total bilirubin 1.5, albumin 4.4, platelets 340 - 11/25/2018: AST 97, ALT 174, alk phos 62, total bilirubin 1.5, total protein 7.2, albumin 4.6, WBC 7.6, hemoglobin 12.9, platelets 377, B12 1090, vitamin D low at 22, TSH 1.26 - 01/05/19: IgG 979, IgM 38, TB 0.6, ALT 118, glucose 113, GGT 44, iron 62, ferritin 57.7 - 01/05/19: ANA 1:40, nuclear, speckled -01/05/19: NASH FibroSURE F0, S3 - marked or severe steatotis, NASH score 0.5 suggesting N1-borderline or probable NASH - 08/23/19: AST 34, ALT 45, alk phos 69, total bilirubin 1.0, HgbA1C 5.6, insulin 12.7, glucose 97  Abdominal imaging: - Abdominal ultrasound 12/14/2017: Gallstones, echogenic liver - Abdominal ultrasound 12/16/2018: 2.8 cm gallstone and an echogenic liver.  There was normal portal vein flow.  Spleen not included in the report..  - Elastography 09/26/19:  Suspected fatty infiltration of liver. Cholelithiasis. Median kPa:  5.1.   Past Medical History:  Diagnosis Date  . Anxiety   .  B12 deficiency   . Elevated liver enzymes   . Fatty liver   . Gallstones   . Hyperlipidemia   . Hypertension    not recently  . Obesity   . Pre-diabetes   . Uterine fibroid   . Vitamin D deficiency     Past Surgical History:    Procedure Laterality Date  . APPENDECTOMY  1997  . MYOMECTOMY N/A 02/16/2018   Procedure: ABDOMINAL MYOMECTOMY;  Surgeon: Geryl Rankins, MD;  Location: [facility];  Service: Gynecology;  Laterality: N/A;  . TONSILLECTOMY AND ADENOIDECTOMY  1988  . TRACHEOSTOMY  1979   done during epiglotitis  . UTERINE FIBROID SURGERY      Current Outpatient Medications  Medication Sig Dispense Refill  . B Complex-C-Folic Acid (HM SUPER VITAMIN B COMPLEX/C PO) Take by mouth.    . Magnesium 500 MG CAPS Take 500 mg by mouth daily.    . Melatonin 10 MG CAPS Take by mouth.    . metFORMIN (GLUCOPHAGE) 500 MG tablet Take 1 tablet (500 mg total) by mouth 2 (two) times daily with a meal. 180 tablet 0  . MILK THISTLE PO Take 375 mg by mouth daily.    . Multiple Vitamin (MULTIVITAMIN WITH MINERALS) TABS tablet Take 1 tablet by mouth daily.    . Probiotic Product (PROBIOTIC DAILY PO) Take by mouth.    Marland Kitchen VITAMIN D PO Take by mouth.     No current facility-administered medications for this visit.    Allergies as of 10/05/2019  . (No Known Allergies)    Family History  Problem Relation Age of Onset  . Diabetes Mother   . Other Mother        traumatic brain injury  . High Cholesterol Mother   . Obesity Mother   . Hyperlipidemia Father   . Hypertension Father   . Anxiety disorder Father   . Sleep apnea Father   . Obesity Father   . Breast cancer Maternal Grandmother   . Alcohol abuse Maternal Grandfather   . High blood pressure Brother   . Breast cancer Other   . Heart attack Paternal Grandmother 64  . Colon cancer Neg Hx   . Stomach cancer Neg Hx   . Esophageal cancer Neg Hx     Social History   Socioeconomic History  . Marital status: Married    Spouse name: Darlene Mccormick  . Number of children: Not on file  . Years of education: Not on file  . Highest education level: Not on file  Occupational History  . Occupation: Insurance underwriter & Interdisciplinary Studies  Tobacco Use  . Smoking  status: Never Smoker  . Smokeless tobacco: Never Used  Vaping Use  . Vaping Use: Never used  Substance and Sexual Activity  . Alcohol use: Yes    Alcohol/week: 1.0 standard drink    Types: 1 Glasses of wine per week    Comment: occ  . Drug use: No  . Sexual activity: Yes    Partners: Male  Other Topics Concern  . Not on file  Social History Narrative  . Not on file   Social Determinants of Health   Financial Resource Strain:   . Difficulty of Paying Living Expenses:   Food Insecurity:   . Worried About Programme researcher, broadcasting/film/video in the Last Year:   . Barista in the Last Year:   Transportation Needs:   . Freight forwarder (Medical):   Marland Kitchen Lack of Transportation (Non-Medical):  Physical Activity:   . Days of Exercise per Week:   . Minutes of Exercise per Session:   Stress:   . Feeling of Stress :   Social Connections:   . Frequency of Communication with Friends and Family:   . Frequency of Social Gatherings with Friends and Family:   . Attends Religious Services:   . Active Member of Clubs or Organizations:   . Attends Archivist Meetings:   Marland Kitchen Marital Status:   Intimate Partner Violence:   . Fear of Current or Ex-Partner:   . Emotionally Abused:   Marland Kitchen Physically Abused:   . Sexually Abused:     Physical Exam General:   Alert, in NAD.  HEENT: No scleral icterus. No bilateral temporal wasting.  Heart:  Regular rate and rhythm; no murmurs Pulm: Clear anteriorly; no wheezing Abdomen:  Soft. Mild central obesity. Nontender. Nondistended. Normal bowel sounds. No rebound or guarding. No fluid wave. No hepatosplenomegaly.  LAD: No inguinal or umbilical LAD Extremities:  Without edema. Neurologic:  Alert and  oriented x4;  grossly normal neurologically; no asterixis or clonus. Skin: No jaundice. No palmar erythema or spider angioma. No Terry's nails.  Psych:  Alert and cooperative. Normal mood and affect.   Lynnie Koehler L. Tarri Glenn, MD, MPH 10/05/2019, 2:15  PM

## 2019-10-05 NOTE — Patient Instructions (Addendum)
Congratulations on taking such good care of yourself.   Let's plan to recheck your liver in 12 months.  My Mom sees Dr. Ellouise Newer with [facility].  Please let me know if you need anything before that time.   If you are age 43 or older, your body mass index should be between 23-30. Your Body mass index is 40.35 kg/m. If this is out of the aforementioned range listed, please consider follow up with your Primary Care Provider.  If you are age 48 or younger, your body mass index should be between 19-25. Your Body mass index is 40.35 kg/m. If this is out of the aformentioned range listed, please consider follow up with your Primary Care Provider.    I appreciate the  opportunity to care for you  Thank You   Santo Held

## 2019-10-07 ENCOUNTER — Encounter: Payer: Self-pay | Admitting: Gastroenterology

## 2019-10-12 ENCOUNTER — Telehealth (INDEPENDENT_AMBULATORY_CARE_PROVIDER_SITE_OTHER): Payer: Self-pay | Admitting: Family Medicine

## 2019-10-12 ENCOUNTER — Encounter (INDEPENDENT_AMBULATORY_CARE_PROVIDER_SITE_OTHER): Payer: Self-pay

## 2019-10-12 DIAGNOSIS — R7303 Prediabetes: Secondary | ICD-10-CM

## 2019-10-12 MED ORDER — METFORMIN HCL 500 MG PO TABS: 500.0000 mg | ORAL_TABLET | Freq: Two times a day (BID) | ORAL | 0 refills | Status: DC

## 2019-10-12 NOTE — Telephone Encounter (Signed)
Please see

## 2019-10-12 NOTE — Telephone Encounter (Signed)
Pt called about a refill on her 3 month supply on metformin. Please advice.

## 2019-10-23 ENCOUNTER — Ambulatory Visit (INDEPENDENT_AMBULATORY_CARE_PROVIDER_SITE_OTHER): Payer: 59 | Admitting: Family Medicine

## 2019-10-24 ENCOUNTER — Other Ambulatory Visit: Payer: Self-pay

## 2019-10-24 ENCOUNTER — Encounter (INDEPENDENT_AMBULATORY_CARE_PROVIDER_SITE_OTHER): Payer: Self-pay | Admitting: Family Medicine

## 2019-10-24 ENCOUNTER — Ambulatory Visit (INDEPENDENT_AMBULATORY_CARE_PROVIDER_SITE_OTHER): Payer: 59 | Admitting: Family Medicine

## 2019-10-24 VITALS — BP 115/80 | HR 87 | Temp 97.8°F | Ht 68.0 in | Wt 268.0 lb

## 2019-10-24 DIAGNOSIS — Z6841 Body Mass Index (BMI) 40.0 and over, adult: Secondary | ICD-10-CM | POA: Diagnosis not present

## 2019-10-24 DIAGNOSIS — E8881 Metabolic syndrome: Secondary | ICD-10-CM

## 2019-10-24 NOTE — Progress Notes (Signed)
Chief Complaint:   OBESITY Darlene Mccormick is here to discuss her progress with her obesity treatment plan along with follow-up of her obesity related diagnoses. Darlene Mccormick is on keeping a food journal and adhering to recommended goals of 1300-1500 calories and 90 grams of protein daily and states she is following her eating plan approximately 60-65% of the time. Darlene Mccormick states she is swimming and walking for 30 minutes 2-3 times per week.  Today's visit was #: 8 Starting weight: 261 lbs Starting date: 05/03/2019 Today's weight: 268 lbs Today's date: 10/24/2019 Total lbs lost to date: 0 Total lbs lost since last in-office visit: 0  Interim History: Darlene Mccormick was off the plan over the weekend and she ate out several times. She is journaling using [employer]. She reports exceeding calorie goals. However she meets protein goals.  She says that she feels she lacks motivation to adhere to plan.   Subjective:   1. Insulin resistance Nykia has a diagnosis of insulin resistance based on her elevated fasting insulin level >5. She denies polyphagia, and she is on metformin 500 mg BID. She continues to work on diet and exercise to decrease her risk of diabetes.  Lab Results  Component Value Date   INSULIN 12.7 08/23/2019   INSULIN 16.2 05/03/2019   Lab Results  Component Value Date   HGBA1C 5.6 08/23/2019   Assessment/Plan:   1. Insulin resistance Zell will continue to work on weight loss, exercise, and decreasing simple carbohydrates to help decrease the risk of diabetes. We discussed Ozempic and she will research this. Darlene Mccormick agreed to follow-up with [place] as directed to closely monitor her progress.  2. Class 3 severe obesity with serious comorbidity and body mass index (BMI) of 40.0 to 44.9 in adult, unspecified obesity type [facility]) Darlene Mccormick is currently in the action stage of change. As such, her goal is to continue with weight loss efforts. She has agreed to the Category 3 Plan or keeping a food journal and adhering to  recommended goals of 1300-1500 calories and 90 grams of protein daily.   Darlene Mccormick was advised that the structure of the Category 3 will be an advantage for her if she chooses to adhere to it. We discussed planning what she will order before she goes to restaurants.  Exercise goals: As is.  Behavioral modification strategies: increasing lean protein intake, decreasing simple carbohydrates and keeping a strict food journal.  Darlene Mccormick has agreed to follow-up with our clinic in 4 weeks at her request.   Objective:   Blood pressure 115/80, pulse 87, temperature 97.8 F (36.6 C), temperature source Oral, height 5\' 8"  (1.727 m), weight (!) 268 lb (121.6 kg), SpO2 97 %. Body mass index is 40.75 kg/m.  General: Cooperative, alert, well developed, in no acute distress. HEENT: Conjunctivae and lids unremarkable. Cardiovascular: Regular rhythm.  Lungs: Normal work of breathing. Neurologic: No focal deficits.   Lab Results  Component Value Date   CREATININE 0.63 08/23/2019   BUN 8 08/23/2019   NA 140 08/23/2019   K 4.7 08/23/2019   CL 100 08/23/2019   CO2 20 08/23/2019   Lab Results  Component Value Date   ALT 45 (H) 08/23/2019   AST 34 08/23/2019   ALKPHOS 69 08/23/2019   BILITOT 1.0 08/23/2019   Lab Results  Component Value Date   HGBA1C 5.6 08/23/2019   HGBA1C 5.7 (H) 05/03/2019   HGBA1C 5.7 01/24/2019   HGBA1C 5.6 11/26/2017   Lab Results  Component Value Date  INSULIN 12.7 08/23/2019   INSULIN 16.2 05/03/2019   Lab Results  Component Value Date   TSH 1.060 05/03/2019   Lab Results  Component Value Date   CHOL 223 (H) 08/23/2019   HDL 40 08/23/2019   LDLCALC 145 (H) 08/23/2019   LDLDIRECT 166.0 11/26/2017   TRIG 209 (H) 08/23/2019   CHOLHDL 7.0 (H) 11/25/2018   Lab Results  Component Value Date   WBC 7.6 11/25/2018   HGB 12.9 11/25/2018   HCT 37.9 11/25/2018   MCV 87.3 11/25/2018   PLT 377 11/25/2018   Lab Results  Component Value Date   IRON 62 01/05/2019     FERRITIN 57.7 01/05/2019   Attestation Statements:   Reviewed by clinician on day of visit: allergies, medications, problem list, medical history, surgical history, family history, social history, and previous encounter notes.   Wilhemena Durie, am acting as Location manager for [employer], FNP-C.  I have reviewed the above documentation for accuracy and completeness, and I agree with the above. -  Georgianne Fick, FNP

## 2019-11-22 ENCOUNTER — Ambulatory Visit (INDEPENDENT_AMBULATORY_CARE_PROVIDER_SITE_OTHER): Payer: 59 | Admitting: Family Medicine

## 2019-12-12 ENCOUNTER — Ambulatory Visit (INDEPENDENT_AMBULATORY_CARE_PROVIDER_SITE_OTHER): Payer: 59 | Admitting: Family Medicine

## 2019-12-28 ENCOUNTER — Ambulatory Visit (INDEPENDENT_AMBULATORY_CARE_PROVIDER_SITE_OTHER): Payer: 59 | Admitting: Family Medicine

## 2020-01-09 ENCOUNTER — Other Ambulatory Visit (INDEPENDENT_AMBULATORY_CARE_PROVIDER_SITE_OTHER): Payer: Self-pay | Admitting: Family Medicine

## 2020-01-09 ENCOUNTER — Encounter (INDEPENDENT_AMBULATORY_CARE_PROVIDER_SITE_OTHER): Payer: Self-pay

## 2020-01-09 DIAGNOSIS — R7303 Prediabetes: Secondary | ICD-10-CM

## 2020-01-09 NOTE — Telephone Encounter (Signed)
Message sent to pt.

## 2020-01-11 NOTE — Telephone Encounter (Signed)
Refill protocol given to [facility]

## 2020-01-17 ENCOUNTER — Other Ambulatory Visit: Payer: Self-pay | Admitting: Obstetrics & Gynecology

## 2020-01-17 DIAGNOSIS — N6001 Solitary cyst of right breast: Secondary | ICD-10-CM

## 2020-01-18 ENCOUNTER — Ambulatory Visit (INDEPENDENT_AMBULATORY_CARE_PROVIDER_SITE_OTHER): Payer: 59 | Admitting: Family Medicine

## 2020-01-22 LAB — HM PAP SMEAR: HPV, high-risk: NEGATIVE

## 2020-02-07 ENCOUNTER — Encounter (INDEPENDENT_AMBULATORY_CARE_PROVIDER_SITE_OTHER): Payer: Self-pay | Admitting: Family Medicine

## 2020-02-07 NOTE — Telephone Encounter (Signed)
Please advise 

## 2020-02-08 ENCOUNTER — Other Ambulatory Visit: Payer: Self-pay

## 2020-02-08 ENCOUNTER — Ambulatory Visit (INDEPENDENT_AMBULATORY_CARE_PROVIDER_SITE_OTHER): Payer: 59 | Admitting: Family Medicine

## 2020-02-08 ENCOUNTER — Encounter (INDEPENDENT_AMBULATORY_CARE_PROVIDER_SITE_OTHER): Payer: Self-pay | Admitting: Family Medicine

## 2020-02-08 VITALS — BP 128/84 | HR 82 | Temp 97.9°F | Ht 68.0 in | Wt 268.0 lb

## 2020-02-08 DIAGNOSIS — Z9189 Other specified personal risk factors, not elsewhere classified: Secondary | ICD-10-CM | POA: Diagnosis not present

## 2020-02-08 DIAGNOSIS — E559 Vitamin D deficiency, unspecified: Secondary | ICD-10-CM | POA: Diagnosis not present

## 2020-02-08 DIAGNOSIS — Z6841 Body Mass Index (BMI) 40.0 and over, adult: Secondary | ICD-10-CM

## 2020-02-08 DIAGNOSIS — E8881 Metabolic syndrome: Secondary | ICD-10-CM

## 2020-02-08 DIAGNOSIS — R4189 Other symptoms and signs involving cognitive functions and awareness: Secondary | ICD-10-CM

## 2020-02-08 MED ORDER — METFORMIN HCL 500 MG PO TABS: 500.0000 mg | ORAL_TABLET | Freq: Two times a day (BID) | ORAL | 0 refills | Status: DC

## 2020-02-08 NOTE — Progress Notes (Signed)
Chief Complaint:   OBESITY Darlene Mccormick is here to discuss her progress with her obesity treatment plan along with follow-up of her obesity related diagnoses. [name] is on the Category 3 Plan or keeping a food journal and adhering to recommended goals of 1300-1500 calories and 90 grams of protein daily and states she is following her eating plan approximately 50% of the time. [name] states she is walking and swimming for 20-30 minutes 6-7 times per week.  Today's visit was #: 9 Starting weight: 261 lbs Starting date: 05/03/2019 Today's weight: 268 lbs Today's date: 02/08/2020 Total lbs lost to date: 0 Total lbs lost since last in-office visit: 0  Interim History: Darlene Mccormick's last office visit was 10/24/2019 and she has maintained her weight. She has had 3 deaths in her family and she is happy with weight maintenance. Her father in-law died and it has been a struggle. She notes fatigue but denies stress eating. She is not using Noom currently and not journaling. She is skipping some meals and she knows she is not getting enough protein.  Subjective:   1. Brain fog [name]'s notes recent issues with brain fog and word finding. She has been grieving some deaths and not sleeping well. She wonders if it is caused by B12 deficiency or the metformin. She is taking a B Complex vitamin that contains B12. She has been on metformin for a while with no cognitive issues.  2. Insulin resistance Darlene Mccormick notes occasional diarrhea with metformin. She does not note excessive hunger.   Lab Results  Component Value Date   INSULIN 12.7 08/23/2019   INSULIN 16.2 05/03/2019   Lab Results  Component Value Date   HGBA1C 5.6 08/23/2019   3. Vitamin D deficiency Darlene Mccormick takes 5,000 IU OTC Vit D daily. She notes fatigue and brain fog.  4. At risk for malnutrition Darlene Mccormick is at increased risk for malnutrition due to inadequate protein.  Assessment/Plan:   1. Brain fog We will check labs today, and Ethyle will continue seeing her  therapist every 2 weeks. I feel this is likely from grief, stress, and lack of restorative sleep.   - Vitamin B12 - T3 - T4, free - TSH  2. Insulin resistance  We will check labs today, and we will refill metformin for 90 days with no refills.I told her she could stop taking the metformin to see if brain fog improves.  - metFORMIN (GLUCOPHAGE) 500 MG tablet; Take 1 tablet (500 mg total) by mouth 2 (two) times daily with a meal.  Dispense: 180 tablet; Refill: 0 - Hemoglobin A1c  3. Vitamin D deficiency  We will check labs today. Damisha agreed to continue taking OTC Vitamin D and will follow-up for routine testing of Vitamin D, at least 2-3 times per year to avoid over-replacement.  - VITAMIN D 25 Hydroxy (Vit-D Deficiency, Fractures)  4. At risk for malnutrition Darlene Mccormick was given approximately 15 minutes of counseling today regarding prevention of malnutrition and ways to meet macronutrient goals..   5. Class 3 severe obesity with serious comorbidity and body mass index (BMI) of 40.0 to 44.9 in adult, unspecified obesity type [facility]) [name] is currently in the action stage of change. As such, her goal is to continue with weight loss efforts. She has agreed to keeping a food journal and adhering to recommended goals of 1500 calories and 90 grams of protein daily.   Exercise goals: As is.  Behavioral modification strategies: increasing lean protein intake.  Darlene Mccormick has agreed  to follow-up with our clinic in 4 weeks.  [name] was informed we would discuss her lab results at her next visit unless there is a critical issue that needs to be addressed sooner. [name] agreed to keep her next visit at the agreed upon time to discuss these results.  Objective:   Blood pressure 128/84, pulse 82, temperature 97.9 F (36.6 C), temperature source Oral, height 5\' 8"  (1.727 m), weight 268 lb (121.6 kg), SpO2 98 %. Body mass index is 40.75 kg/m.  General: Cooperative, alert, well developed, in no acute  distress. HEENT: Conjunctivae and lids unremarkable. Cardiovascular: Regular rhythm.  Lungs: Normal work of breathing. Neurologic: No focal deficits.   Lab Results  Component Value Date   CREATININE 0.63 08/23/2019   BUN 8 08/23/2019   NA 140 08/23/2019   K 4.7 08/23/2019   CL 100 08/23/2019   CO2 20 08/23/2019   Lab Results  Component Value Date   ALT 45 (H) 08/23/2019   AST 34 08/23/2019   ALKPHOS 69 08/23/2019   BILITOT 1.0 08/23/2019   Lab Results  Component Value Date   HGBA1C 5.6 08/23/2019   HGBA1C 5.7 (H) 05/03/2019   HGBA1C 5.7 01/24/2019   HGBA1C 5.6 11/26/2017   Lab Results  Component Value Date   INSULIN 12.7 08/23/2019   INSULIN 16.2 05/03/2019   Lab Results  Component Value Date   TSH 1.060 05/03/2019   Lab Results  Component Value Date   CHOL 223 (H) 08/23/2019   HDL 40 08/23/2019   LDLCALC 145 (H) 08/23/2019   LDLDIRECT 166.0 11/26/2017   TRIG 209 (H) 08/23/2019   CHOLHDL 7.0 (H) 11/25/2018   Lab Results  Component Value Date   WBC 7.6 11/25/2018   HGB 12.9 11/25/2018   HCT 37.9 11/25/2018   MCV 87.3 11/25/2018   PLT 377 11/25/2018   Lab Results  Component Value Date   IRON 62 01/05/2019   FERRITIN 57.7 01/05/2019   Attestation Statements:   Reviewed by clinician on day of visit: allergies, medications, problem list, medical history, surgical history, family history, social history, and previous encounter notes.   Wilhemena Durie, am acting as Location manager for [employer], FNP-C.  I have reviewed the above documentation for accuracy and completeness, and I agree with the above. -  Georgianne Fick, FNP

## 2020-02-09 LAB — VITAMIN B12: Vitamin B-12: 1187 pg/mL (ref 232–1245)

## 2020-02-09 LAB — TSH: TSH: 1.31 u[IU]/mL (ref 0.450–4.500)

## 2020-02-09 LAB — T3: T3, Total: 121 ng/dL (ref 71–180)

## 2020-02-09 LAB — T4, FREE: Free T4: 1.31 ng/dL (ref 0.82–1.77)

## 2020-02-09 LAB — HEMOGLOBIN A1C
Est. average glucose Bld gHb Est-mCnc: 126 mg/dL
Hgb A1c MFr Bld: 6 % — ABNORMAL HIGH (ref 4.8–5.6)

## 2020-02-09 LAB — VITAMIN D 25 HYDROXY (VIT D DEFICIENCY, FRACTURES): Vit D, 25-Hydroxy: 50.7 ng/mL (ref 30.0–100.0)

## 2020-02-12 ENCOUNTER — Encounter (INDEPENDENT_AMBULATORY_CARE_PROVIDER_SITE_OTHER): Payer: Self-pay | Admitting: Family Medicine

## 2020-03-06 ENCOUNTER — Other Ambulatory Visit: Payer: Self-pay

## 2020-03-06 ENCOUNTER — Ambulatory Visit (INDEPENDENT_AMBULATORY_CARE_PROVIDER_SITE_OTHER): Payer: 59 | Admitting: Family Medicine

## 2020-03-06 ENCOUNTER — Encounter (INDEPENDENT_AMBULATORY_CARE_PROVIDER_SITE_OTHER): Payer: Self-pay | Admitting: Family Medicine

## 2020-03-06 VITALS — BP 134/85 | HR 86 | Temp 98.0°F | Ht 68.0 in | Wt 268.0 lb

## 2020-03-06 DIAGNOSIS — E7849 Other hyperlipidemia: Secondary | ICD-10-CM

## 2020-03-06 DIAGNOSIS — Z6841 Body Mass Index (BMI) 40.0 and over, adult: Secondary | ICD-10-CM

## 2020-03-06 DIAGNOSIS — R7303 Prediabetes: Secondary | ICD-10-CM

## 2020-03-06 DIAGNOSIS — Z9189 Other specified personal risk factors, not elsewhere classified: Secondary | ICD-10-CM | POA: Diagnosis not present

## 2020-03-06 DIAGNOSIS — K760 Fatty (change of) liver, not elsewhere classified: Secondary | ICD-10-CM | POA: Diagnosis not present

## 2020-03-06 DIAGNOSIS — R4189 Other symptoms and signs involving cognitive functions and awareness: Secondary | ICD-10-CM | POA: Diagnosis not present

## 2020-03-07 LAB — CBC WITH DIFFERENTIAL/PLATELET
Basophils Absolute: 0 10*3/uL (ref 0.0–0.2)
Basos: 1 %
EOS (ABSOLUTE): 0.2 10*3/uL (ref 0.0–0.4)
Eos: 3 %
Hematocrit: 37.4 % (ref 34.0–46.6)
Hemoglobin: 12.6 g/dL (ref 11.1–15.9)
Immature Grans (Abs): 0 10*3/uL (ref 0.0–0.1)
Immature Granulocytes: 0 %
Lymphocytes Absolute: 1.8 10*3/uL (ref 0.7–3.1)
Lymphs: 30 %
MCH: 29.2 pg (ref 26.6–33.0)
MCHC: 33.7 g/dL (ref 31.5–35.7)
MCV: 87 fL (ref 79–97)
Monocytes Absolute: 0.5 10*3/uL (ref 0.1–0.9)
Monocytes: 8 %
Neutrophils Absolute: 3.5 10*3/uL (ref 1.4–7.0)
Neutrophils: 58 %
Platelets: 358 10*3/uL (ref 150–450)
RBC: 4.32 x10E6/uL (ref 3.77–5.28)
RDW: 12.7 % (ref 11.7–15.4)
WBC: 6.1 10*3/uL (ref 3.4–10.8)

## 2020-03-07 LAB — LIPID PANEL WITH LDL/HDL RATIO
Cholesterol, Total: 244 mg/dL — ABNORMAL HIGH (ref 100–199)
HDL: 39 mg/dL — ABNORMAL LOW (ref >39)
LDL Chol Calc (NIH): 148 mg/dL — ABNORMAL HIGH (ref 0–99)
LDL/HDL Ratio: 3.8 ratio — ABNORMAL HIGH (ref 0.0–3.2)
Triglycerides: 308 mg/dL — ABNORMAL HIGH (ref 0–149)
VLDL Cholesterol Cal: 57 mg/dL — ABNORMAL HIGH (ref 5–40)

## 2020-03-07 LAB — COMPREHENSIVE METABOLIC PANEL
ALT: 90 IU/L — ABNORMAL HIGH (ref 0–32)
AST: 54 IU/L — ABNORMAL HIGH (ref 0–40)
Albumin/Globulin Ratio: 1.7 (ref 1.2–2.2)
Albumin: 4.3 g/dL (ref 3.8–4.8)
Alkaline Phosphatase: 67 IU/L (ref 44–121)
BUN/Creatinine Ratio: 11 (ref 9–23)
BUN: 8 mg/dL (ref 6–24)
Bilirubin Total: 0.8 mg/dL (ref 0.0–1.2)
CO2: 22 mmol/L (ref 20–29)
Calcium: 8.9 mg/dL (ref 8.7–10.2)
Chloride: 100 mmol/L (ref 96–106)
Creatinine, Ser: 0.72 mg/dL (ref 0.57–1.00)
GFR calc Af Amer: 119 mL/min/{1.73_m2} (ref >59)
GFR calc non Af Amer: 104 mL/min/{1.73_m2} (ref >59)
Globulin, Total: 2.5 g/dL (ref 1.5–4.5)
Glucose: 106 mg/dL — ABNORMAL HIGH (ref 65–99)
Potassium: 4.6 mmol/L (ref 3.5–5.2)
Sodium: 138 mmol/L (ref 134–144)
Total Protein: 6.8 g/dL (ref 6.0–8.5)

## 2020-03-07 LAB — INSULIN, RANDOM: INSULIN: 23.3 u[IU]/mL (ref 2.6–24.9)

## 2020-03-07 NOTE — Progress Notes (Signed)
Chief Complaint:   OBESITY Darlene Mccormick is here to discuss her progress with her obesity treatment plan along with follow-up of her obesity related diagnoses. Darlene Mccormick is on keeping a food journal and adhering to recommended goals of 1500 calories and 90 grams of protein daily and states she is following her eating plan approximately 50% of the time. Darlene Mccormick states she is walking and swimming for 30 minutes 3 times per week.  Today's visit was #: 10 Starting weight: 261 lbs Starting date: 05/03/2019 Today's weight: 268 lbs Today's date: 03/06/2020 Total lbs lost to date: 0 Total lbs lost since last in-office visit: 0  Interim History: Darlene Mccormick is journaling about half the time. She has maintained her weight today but is up 7 lbs from her starting weight of 261 on 05/03/19. She notes less hunger overall. She is still very fatigued due to her job but she has some time off coming up. She and her husband eat out several times per week. She has not been using Noom as she had in the past.  Subjective:   1. Pre-diabetes Darlene Mccormick's A1c is worsening and is up to 6.0 from 5.6 at her last check. She is on metformin but tapering off to see if this is contributing to her symptom of brain fog.ia.  Lab Results  Component Value Date   HGBA1C 6.0 (H) 02/08/2020   Lab Results  Component Value Date   INSULIN 23.3 03/06/2020   INSULIN 12.7 08/23/2019   INSULIN 16.2 05/03/2019   2. NAFLD (nonalcoholic fatty liver disease) Darlene Mccormick's last ALT was elevated at 45, and AST was within normal limits (08/23/19).   Lab Results  Component Value Date   ALT 90 (H) 03/06/2020   AST 54 (H) 03/06/2020   ALKPHOS 67 03/06/2020   BILITOT 0.8 03/06/2020   3. Other hyperlipidemia Not at goal. Last LDL was high at 145, HDL was low at 40, and she is not on statin.   Lab Results  Component Value Date   ALT 90 (H) 03/06/2020   AST 54 (H) 03/06/2020   ALKPHOS 67 03/06/2020   BILITOT 0.8 03/06/2020   Lab Results  Component Value Date    CHOL 244 (H) 03/06/2020   HDL 39 (L) 03/06/2020   LDLCALC 148 (H) 03/06/2020   LDLDIRECT 166.0 11/26/2017   TRIG 308 (H) 03/06/2020   CHOLHDL 7.0 (H) 11/25/2018   4. Brain fog Darlene Mccormick notes she is still having some brain fog. She has discontinued metformin to see if this is contributing.  5. At risk for diabetes mellitus Darlene Mccormick is at higher than average risk for developing diabetes due to obesity and family history.   Assessment/Plan:   1. Pre-diabetes Darlene Mccormick will start back on metformin if her brain fog does not improve while she is not taking it. We discussed Ozempic and she will consider this. We will check labs today.  - Insulin, random  2. NAFLD (nonalcoholic fatty liver disease) We discussed the likely diagnosis of non-alcoholic fatty liver disease today and how this condition is obesity related. Darlene Mccormick was educated the importance of weight loss. Darlene Mccormick agreed to continue with her weight loss efforts with healthier diet and exercise as an essential part of her treatment plan. We will check labs today.  - CBC with Differential/Platelet - Comprehensive metabolic panel  3. Other hyperlipidemia Cardiovascular risk and specific lipid/LDL goals reviewed. We discussed several lifestyle modifications today and Darlene Mccormick will continue to work on diet, exercise and weight loss efforts. We  will check labs today.   - Lipid Panel With LDL/HDL Ratio  4. Brain fog Darlene Mccormick will work on increasing sleep and self-care.  5. At risk for diabetes mellitus Darlene Mccormick was given approximately 15 minutes of diabetes education and counseling today. We discussed intensive lifestyle modifications today with an emphasis on weight loss as well as increasing exercise and decreasing simple carbohydrates in her diet. We also reviewed medication options with an emphasis on risk versus benefit of those discussed.   Repetitive spaced learning was employed today to elicit superior memory formation and behavioral change.  6. Class  3 severe obesity with serious comorbidity and body mass index (BMI) of 40.0 to 44.9 in adult, unspecified obesity type Darlene Mccormick) Darlene Mccormick is currently in the action stage of change. As such, her goal is to continue with weight loss efforts. She has agreed to keeping a food journal and adhering to recommended goals of 1500 calories and 90 grams of protein daily.   Darlene Mccormick is to journal 5 times per week.  Exercise goals: As is.  Behavioral modification strategies: increasing lean protein intake and decreasing simple carbohydrates.  Darlene Mccormick has agreed to follow-up with our clinic in 4 weeks.  Darlene Mccormick was informed we would discuss her lab results at her next visit unless there is a critical issue that needs to be addressed sooner. Darlene Mccormick agreed to keep her next visit at the agreed upon time to discuss these results.  Objective:   Blood pressure 134/85, pulse 86, temperature 98 F (36.7 C), height 5\' 8"  (1.727 m), weight 268 lb (121.6 kg), SpO2 97 %. Body mass index is 40.75 kg/m.  General: Cooperative, alert, well developed, in no acute distress. HEENT: Conjunctivae and lids unremarkable. Cardiovascular: Regular rhythm.  Lungs: Normal work of breathing. Neurologic: No focal deficits.   Lab Results  Component Value Date   CREATININE 0.72 03/06/2020   BUN 8 03/06/2020   NA 138 03/06/2020   K 4.6 03/06/2020   CL 100 03/06/2020   CO2 22 03/06/2020   Lab Results  Component Value Date   ALT 90 (H) 03/06/2020   AST 54 (H) 03/06/2020   ALKPHOS 67 03/06/2020   BILITOT 0.8 03/06/2020   Lab Results  Component Value Date   HGBA1C 6.0 (H) 02/08/2020   HGBA1C 5.6 08/23/2019   HGBA1C 5.7 (H) 05/03/2019   HGBA1C 5.7 01/24/2019   HGBA1C 5.6 11/26/2017   Lab Results  Component Value Date   INSULIN 23.3 03/06/2020   INSULIN 12.7 08/23/2019   INSULIN 16.2 05/03/2019   Lab Results  Component Value Date   TSH 1.310 02/08/2020   Lab Results  Component Value Date   CHOL 244 (H) 03/06/2020   HDL 39 (L)  03/06/2020   LDLCALC 148 (H) 03/06/2020   LDLDIRECT 166.0 11/26/2017   TRIG 308 (H) 03/06/2020   CHOLHDL 7.0 (H) 11/25/2018   Lab Results  Component Value Date   WBC 6.1 03/06/2020   HGB 12.6 03/06/2020   HCT 37.4 03/06/2020   MCV 87 03/06/2020   PLT 358 03/06/2020   Lab Results  Component Value Date   IRON 62 01/05/2019   FERRITIN 57.7 01/05/2019   Attestation Statements:   Reviewed by clinician on day of visit: allergies, medications, problem list, medical history, surgical history, family history, social history, and previous encounter notes.   Wilhemena Durie, am acting as Location manager for [employer], FNP-C.  I have reviewed the above documentation for accuracy and completeness, and I agree with the above. -  Georgianne Fick, FNP

## 2020-03-11 ENCOUNTER — Encounter (INDEPENDENT_AMBULATORY_CARE_PROVIDER_SITE_OTHER): Payer: Self-pay | Admitting: Family Medicine

## 2020-03-18 ENCOUNTER — Other Ambulatory Visit: Payer: Self-pay | Admitting: Obstetrics & Gynecology

## 2020-03-18 ENCOUNTER — Ambulatory Visit
Admission: RE | Admit: 2020-03-18 | Discharge: 2020-03-18 | Disposition: A | Discharge status: Home / Self Care | Payer: 59 | Source: Ambulatory Visit | Attending: Obstetrics & Gynecology | Admitting: Obstetrics & Gynecology

## 2020-03-18 ENCOUNTER — Other Ambulatory Visit: Payer: Self-pay

## 2020-03-18 DIAGNOSIS — N6001 Solitary cyst of right breast: Secondary | ICD-10-CM

## 2020-04-04 ENCOUNTER — Ambulatory Visit (INDEPENDENT_AMBULATORY_CARE_PROVIDER_SITE_OTHER): Payer: 59 | Admitting: Family Medicine

## 2020-04-15 ENCOUNTER — Encounter (INDEPENDENT_AMBULATORY_CARE_PROVIDER_SITE_OTHER): Payer: Self-pay | Admitting: Family Medicine

## 2020-04-17 ENCOUNTER — Ambulatory Visit (INDEPENDENT_AMBULATORY_CARE_PROVIDER_SITE_OTHER): Payer: 59 | Admitting: Family Medicine

## 2020-05-02 ENCOUNTER — Ambulatory Visit (INDEPENDENT_AMBULATORY_CARE_PROVIDER_SITE_OTHER): Payer: 59 | Admitting: Family Medicine

## 2020-05-18 ENCOUNTER — Other Ambulatory Visit (INDEPENDENT_AMBULATORY_CARE_PROVIDER_SITE_OTHER): Payer: Self-pay | Admitting: Family Medicine

## 2020-05-18 DIAGNOSIS — E8881 Metabolic syndrome: Secondary | ICD-10-CM

## 2020-05-20 ENCOUNTER — Encounter (INDEPENDENT_AMBULATORY_CARE_PROVIDER_SITE_OTHER): Payer: Self-pay | Admitting: Family Medicine

## 2020-05-20 NOTE — Telephone Encounter (Signed)
Last seen by Dawn Whitmire, FNP 

## 2020-05-23 ENCOUNTER — Ambulatory Visit (INDEPENDENT_AMBULATORY_CARE_PROVIDER_SITE_OTHER): Payer: 59 | Admitting: Family Medicine

## 2020-09-02 ENCOUNTER — Encounter: Payer: Self-pay | Admitting: Family Medicine

## 2020-09-02 ENCOUNTER — Telehealth (INDEPENDENT_AMBULATORY_CARE_PROVIDER_SITE_OTHER): Payer: 59 | Admitting: Family Medicine

## 2020-09-02 VITALS — Ht 68.0 in

## 2020-09-02 DIAGNOSIS — R059 Cough, unspecified: Secondary | ICD-10-CM

## 2020-09-02 DIAGNOSIS — U071 COVID-19: Secondary | ICD-10-CM

## 2020-09-02 MED ORDER — FLUTICASONE PROPIONATE 50 MCG/ACT NA SUSP
2.0000 | Freq: Every day | NASAL | 0 refills | Status: DC
Start: 2020-09-02 — End: 2020-10-02

## 2020-09-02 MED ORDER — HYDROCODONE BIT-HOMATROP MBR 5-1.5 MG/5ML PO SOLN: 5.0000 mL | Freq: Two times a day (BID) | ORAL | 0 refills | Status: AC | PRN

## 2020-09-02 MED ORDER — BENZONATATE 100 MG PO CAPS: 200.0000 mg | ORAL_CAPSULE | Freq: Two times a day (BID) | ORAL | 0 refills | Status: AC | PRN

## 2020-09-02 NOTE — Progress Notes (Signed)
Virtual Visit via Video Note I connected with Darlene Mccormick on 09/02/20 by a video enabled telemedicine application and verified that I am speaking with the correct person using two identifiers.  Location patient: home Location provider:work office Persons participating in the virtual visit: patient, provider  I discussed the limitations of evaluation and management by telemedicine and the availability of in person appointments. The patient expressed understanding and agreed to proceed.  Chief Complaint  Patient presents with  . Nasal Congestion  . Cough   HPI: Darlene Mccormick is a 45 yo female with history of obesity, prediabetes,seasonal allergies, anxiety, and vitamin D deficiency complaining of 5 days of respiratory symptoms. PCP is not available today.  Subjective fever, fatigue, nasal congestion, rhinorrhea, sore throat, productive cough, and body aches. Negative for hemoptysis. Cough is interfering with sleep.  Home COVID 19 test positive on 08/30/2020. Her husband started with symptoms a few days earlier. Working PCR COVID-19 test 5 days ago negative, her husband was positive.  Negative for anosmia, ageusia, CP, wheezing, abdominal pain, nausea, vomiting, changes in bowel habits, urinary symptoms, or skin rash. "Little" shortness of breath, attributed to nasal congestion. Decreased exercise endurance.  She has been taking ibuprofen 600 mg every 6 hours to help with body aches and headache.  COVID 19 vaccination completed (booster x 1).  In general symptoms are gradually getting better.  ROS: See pertinent positives and negatives per HPI.  Past Medical History:  Diagnosis Date  . Anxiety   . B12 deficiency   . Elevated liver enzymes   . Fatty liver   . Gallstones   . Hyperlipidemia   . Hypertension    not recently  . Obesity   . Pre-diabetes   . Uterine fibroid   . Vitamin D deficiency    Past Surgical History:  Procedure Laterality Date  . APPENDECTOMY  1997  .  MYOMECTOMY N/A 02/16/2018   Procedure: ABDOMINAL MYOMECTOMY;  Surgeon: Thurnell Lose, MD;  Location: [facility];  Service: Gynecology;  Laterality: N/A;  . TONSILLECTOMY AND ADENOIDECTOMY  1988  . TRACHEOSTOMY  1979   done during epiglotitis  . UTERINE FIBROID SURGERY      Family History  Problem Relation Age of Onset  . Diabetes Mother   . Other Mother        traumatic brain injury  . High Cholesterol Mother   . Obesity Mother   . Hyperlipidemia Father   . Hypertension Father   . Anxiety disorder Father   . Sleep apnea Father   . Obesity Father   . Breast cancer Maternal Grandmother   . Alcohol abuse Maternal Grandfather   . High blood pressure Brother   . Breast cancer Other   . Heart attack Paternal Grandmother 78  . Colon cancer Neg Hx   . Stomach cancer Neg Hx   . Esophageal cancer Neg Hx     Social History   Socioeconomic History  . Marital status: Married    Spouse name: Darlene Mccormick  . Number of children: Not on file  . Years of education: Not on file  . Highest education level: Not on file  Occupational History  . Occupation: Chartered certified accountant & [employer] Studies  Tobacco Use  . Smoking status: Never Smoker  . Smokeless tobacco: Never Used  Vaping Use  . Vaping Use: Never used  Substance and Sexual Activity  . Alcohol use: Yes    Alcohol/week: 1.0 standard drink    Types: 1 Glasses of wine per  week    Comment: occ  . Drug use: No  . Sexual activity: Yes    Partners: Male  Other Topics Concern  . Not on file  Social History Narrative  . Not on file   Social Determinants of Health   Financial Resource Strain: Not on file  Food Insecurity: Not on file  Transportation Needs: Not on file  Physical Activity: Not on file  Stress: Not on file  Social Connections: Not on file  Intimate Partner Violence: Not on file    Current Outpatient Medications:  .  B Complex-C-Folic Acid (HM SUPER VITAMIN B COMPLEX/C PO), Take by mouth., Disp: , Rfl:   .  benzonatate (TESSALON) 100 MG capsule, Take 2 capsules (200 mg total) by mouth 2 (two) times daily as needed for up to 10 days., Disp: 30 capsule, Rfl: 0 .  fluticasone (FLONASE) 50 MCG/ACT nasal spray, Place 2 sprays into both nostrils daily., Disp: 16 g, Rfl: 0 .  HYDROcodone bit-homatropine (HYCODAN) 5-1.5 MG/5ML syrup, Take 5 mLs by mouth every 12 (twelve) hours as needed for up to 10 days for cough., Disp: 100 mL, Rfl: 0 .  Magnesium 500 MG CAPS, Take 500 mg by mouth daily., Disp: , Rfl:  .  Melatonin 10 MG CAPS, Take by mouth., Disp: , Rfl:  .  metFORMIN (GLUCOPHAGE) 500 MG tablet, Take 1 tablet (500 mg total) by mouth 2 (two) times daily with a meal., Disp: 180 tablet, Rfl: 0 .  MILK THISTLE PO, Take 375 mg by mouth daily., Disp: , Rfl:  .  Multiple Vitamin (MULTIVITAMIN WITH MINERALS) TABS tablet, Take 1 tablet by mouth daily., Disp: , Rfl:  .  Probiotic Product (PROBIOTIC DAILY PO), Take by mouth., Disp: , Rfl:  .  VITAMIN D PO, Take by mouth., Disp: , Rfl:   EXAM:  VITALS per patient if applicable:Ht 5\' 8"  (1.727 m)   BMI 40.75 kg/m   GENERAL: alert, oriented, appears well and in no acute distress  HEENT: atraumatic, conjunctiva clear, no obvious abnormalities on inspection of external nose and ears Nasal voice, rhinorrhea,and nasal congestion.  NECK: normal movements of the head and neck  LUNGS: on inspection no signs of respiratory distress, breathing rate appears normal, no obvious gross SOB, gasping or wheezing Cough a couple times during visit.  CV: no obvious cyanosis  MS: moves all visible extremities without noticeable abnormality  PSYCH/NEURO: pleasant and cooperative, no obvious depression or anxiety, speech and thought processing grossly intact  ASSESSMENT AND PLAN:  Discussed the following assessment and plan:  COVID-19 virus infection We discussed diagnosis and possible complications, hx does not suggest any at this time. I am recommending  symptomatic treatment:Plenty of p.o. fluids,Flonase nasal spray to help with congestion,nasal saline irrigations as needed, and rest. Tylenol is preferred, 500 mg 3-4 tabs daily if needed.  Note for work will be provided, can go back Wednesday, 7 days after symptoms onset, as far as she does not have fever and symptoms are not worse. Instructed about warning signs.  - fluticasone (FLONASE) 50 MCG/ACT nasal spray; Place 2 sprays into both nostrils daily.  Dispense: 16 g; Refill: 0  Cough Explained that cough and congestion can last a few more weeks after other acute symptoms have resolved. For now I am recommending pharmacologic treatment with Hycodan and benzonatate. I do not think CXR is needed at this time. Adequate hydration. Monitor for new symptoms. Follow with PCP as needed.  - HYDROcodone bit-homatropine (HYCODAN) 5-1.5 MG/5ML syrup;  Take 5 mLs by mouth every 12 (twelve) hours as needed for up to 10 days for cough.  Dispense: 100 mL; Refill: 0 - benzonatate (TESSALON) 100 MG capsule; Take 2 capsules (200 mg total) by mouth 2 (two) times daily as needed for up to 10 days.  Dispense: 30 capsule; Refill: 0  She understands limitations of telemedicine visit vs in person visit.  I discussed the assessment and treatment plan with the patient. Darlene Mccormick was provided an opportunity to ask questions and all were answered. She agreed with the plan and demonstrated an understanding of the instructions.  Return if symptoms worsen or fail to improve.  Dnasia Gauna [place], MD

## 2020-09-29 ENCOUNTER — Other Ambulatory Visit: Payer: Self-pay | Admitting: Family Medicine

## 2020-09-29 DIAGNOSIS — U071 COVID-19: Secondary | ICD-10-CM

## 2020-12-04 ENCOUNTER — Telehealth: Payer: Self-pay | Admitting: Family Medicine

## 2020-12-04 NOTE — Telephone Encounter (Signed)
Patient called after hours line to schedule annual physical with complete fasting blood work.Called patient to schedule, no answer, left voicemail message.

## 2020-12-23 ENCOUNTER — Telehealth: Payer: Self-pay | Admitting: Family Medicine

## 2020-12-23 NOTE — Telephone Encounter (Signed)
PT called to setup a apt this Friday with Dr.Fry for Fatigue and wants to see if she can also have orders for CBC, Thyroid, Iron, A1C bloodwork to placed for Friday as well. Please advise.

## 2020-12-24 NOTE — Telephone Encounter (Signed)
Left a message for the patient to return my call.  

## 2020-12-24 NOTE — Telephone Encounter (Signed)
If she is seeing Dr. Sarajane Jews for new complaint of fatigue, I think she should be evaluated first and discuss what is going on. He can order bloodwork if appropriate. I haven't seen her in the office since 01/2019, so it makes more sense for [place] to get full picture before putting in orders ahead of time.   I do have openings on my schedule; not sure why is seeing Dr. Sarajane Jews (but that's fine if it works better for her schedule)

## 2020-12-25 NOTE — Telephone Encounter (Signed)
Left a detailed message at the patient's cell number with the information below and also stated PCP does not have any openings on Friday.

## 2020-12-27 ENCOUNTER — Other Ambulatory Visit: Payer: Self-pay

## 2020-12-27 ENCOUNTER — Encounter: Payer: Self-pay | Admitting: Family Medicine

## 2020-12-27 ENCOUNTER — Ambulatory Visit: Payer: 59 | Admitting: Family Medicine

## 2020-12-27 VITALS — BP 118/86 | HR 85 | Temp 97.9°F | Wt 277.0 lb

## 2020-12-27 DIAGNOSIS — R5383 Other fatigue: Secondary | ICD-10-CM

## 2020-12-27 DIAGNOSIS — R7303 Prediabetes: Secondary | ICD-10-CM

## 2020-12-27 DIAGNOSIS — E782 Mixed hyperlipidemia: Secondary | ICD-10-CM | POA: Diagnosis not present

## 2020-12-27 DIAGNOSIS — E039 Hypothyroidism, unspecified: Secondary | ICD-10-CM | POA: Diagnosis not present

## 2020-12-27 LAB — TSH: TSH: 1.41 u[IU]/mL (ref 0.35–5.50)

## 2020-12-27 LAB — LIPID PANEL
Cholesterol: 238 mg/dL — ABNORMAL HIGH (ref 0–200)
HDL: 38.2 mg/dL — ABNORMAL LOW (ref >39.00)
NonHDL: 199.39
Total CHOL/HDL Ratio: 6
Triglycerides: 250 mg/dL — ABNORMAL HIGH (ref 0.0–149.0)
VLDL: 50 mg/dL — ABNORMAL HIGH (ref 0.0–40.0)

## 2020-12-27 LAB — CBC WITH DIFFERENTIAL/PLATELET
Basophils Absolute: 0 10*3/uL (ref 0.0–0.1)
Basophils Relative: 0.3 % (ref 0.0–3.0)
Eosinophils Absolute: 0.2 10*3/uL (ref 0.0–0.7)
Eosinophils Relative: 2.9 % (ref 0.0–5.0)
HCT: 38 % (ref 36.0–46.0)
Hemoglobin: 12.6 g/dL (ref 12.0–15.0)
Lymphocytes Relative: 30.7 % (ref 12.0–46.0)
Lymphs Abs: 1.9 10*3/uL (ref 0.7–4.0)
MCHC: 33.3 g/dL (ref 30.0–36.0)
MCV: 86.8 fl (ref 78.0–100.0)
Monocytes Absolute: 0.6 10*3/uL (ref 0.1–1.0)
Monocytes Relative: 8.9 % (ref 3.0–12.0)
Neutro Abs: 3.6 10*3/uL (ref 1.4–7.7)
Neutrophils Relative %: 57.2 % (ref 43.0–77.0)
Platelets: 394 10*3/uL (ref 150.0–400.0)
RBC: 4.37 Mil/uL (ref 3.87–5.11)
RDW: 13.3 % (ref 11.5–15.5)
WBC: 6.2 10*3/uL (ref 4.0–10.5)

## 2020-12-27 LAB — HEPATIC FUNCTION PANEL
ALT: 63 U/L — ABNORMAL HIGH (ref 0–35)
AST: 37 U/L (ref 0–37)
Albumin: 4.3 g/dL (ref 3.5–5.2)
Alkaline Phosphatase: 59 U/L (ref 39–117)
Bilirubin, Direct: 0.1 mg/dL (ref 0.0–0.3)
Total Bilirubin: 1.1 mg/dL (ref 0.2–1.2)
Total Protein: 7.3 g/dL (ref 6.0–8.3)

## 2020-12-27 LAB — BASIC METABOLIC PANEL
BUN: 9 mg/dL (ref 6–23)
CO2: 25 mEq/L (ref 19–32)
Calcium: 9.1 mg/dL (ref 8.4–10.5)
Chloride: 102 mEq/L (ref 96–112)
Creatinine, Ser: 0.6 mg/dL (ref 0.40–1.20)
GFR: 109.65 mL/min (ref >60.00)
Glucose, Bld: 104 mg/dL — ABNORMAL HIGH (ref 70–99)
Potassium: 4.5 mEq/L (ref 3.5–5.1)
Sodium: 136 mEq/L (ref 135–145)

## 2020-12-27 LAB — T3, FREE: T3, Free: 3.6 pg/mL (ref 2.3–4.2)

## 2020-12-27 LAB — HEMOGLOBIN A1C: Hgb A1c MFr Bld: 6 % (ref 4.6–6.5)

## 2020-12-27 LAB — LDL CHOLESTEROL, DIRECT: Direct LDL: 170 mg/dL

## 2020-12-27 LAB — VITAMIN B12: Vitamin B-12: 818 pg/mL (ref 211–911)

## 2020-12-27 LAB — T4, FREE: Free T4: 1.01 ng/dL (ref 0.60–1.60)

## 2020-12-27 NOTE — Progress Notes (Signed)
   Subjective:    Patient ID: Darlene Mccormick, female    DOB: Feb 19, 1976, 45 y.o.   MRN: [id number]  HPI Here to discuss periods of fatigue that began about 3 months ago. She has no other specific symptoms. She says she sleeps well. She is a vegetarian. She has prediabetes (her A1c last Novemeber was 6.0). She took Metformin for 3 months and then stopped.    Review of Systems  Constitutional:  Positive for fatigue. Negative for unexpected weight change.  Respiratory: Negative.    Cardiovascular: Negative.   Gastrointestinal: Negative.   Genitourinary: Negative.       Objective:   Physical Exam Constitutional:      Appearance: She is obese. She is not ill-appearing.  Cardiovascular:     Rate and Rhythm: Normal rate and regular rhythm.     Pulses: Normal pulses.     Heart sounds: Normal heart sounds.  Pulmonary:     Effort: Pulmonary effort is normal.     Breath sounds: Normal breath sounds.  Lymphadenopathy:     Cervical: No cervical adenopathy.  Neurological:     Mental Status: She is alert.          Assessment & Plan:  Chronic fatigue. We will screen with labs today to check for anemia, thyroid disorders, diabetes, etc. If these are unremarkable, I suggested she have a sleep study.  Alysia Penna, MD

## 2021-01-30 ENCOUNTER — Ambulatory Visit (INDEPENDENT_AMBULATORY_CARE_PROVIDER_SITE_OTHER): Payer: 59

## 2021-01-30 ENCOUNTER — Ambulatory Visit: Payer: 59 | Admitting: Sports Medicine

## 2021-01-30 ENCOUNTER — Encounter: Payer: Self-pay | Admitting: Sports Medicine

## 2021-01-30 ENCOUNTER — Other Ambulatory Visit: Payer: Self-pay

## 2021-01-30 DIAGNOSIS — M79674 Pain in right toe(s): Secondary | ICD-10-CM

## 2021-01-30 DIAGNOSIS — M2041 Other hammer toe(s) (acquired), right foot: Secondary | ICD-10-CM

## 2021-01-30 DIAGNOSIS — M779 Enthesopathy, unspecified: Secondary | ICD-10-CM | POA: Diagnosis not present

## 2021-01-30 DIAGNOSIS — B359 Dermatophytosis, unspecified: Secondary | ICD-10-CM

## 2021-01-30 MED ORDER — NYSTATIN-TRIAMCINOLONE 100000-0.1 UNIT/GM-% EX OINT: 1.0000 "application " | TOPICAL_OINTMENT | Freq: Two times a day (BID) | CUTANEOUS | 0 refills | Status: DC

## 2021-01-30 NOTE — Patient Instructions (Signed)
Shoe List For tennis shoes recommend:  Crivitz New balance Saucony HOKA Can be purchased at [employer] sports or [employer] arch fit Can be purchased at any major retailers  Vionic  SAS Can be purchased at [employer] or [employer]   For work shoes recommend: [employer] Work [employer] Can be purchased at a variety of places or [employer]   For casual shoes recommend: Oofos Can be purchased at [employer] sports or [employer]  Can be purchased at [employer] or [place] recommend: Power Steps Can be purchased in office/[facility] [employer] Pride Can be purchased at [employer] sports or [employer] Can be purchased at [place] recommend: Leisure centre manager at [employer] and [employer]

## 2021-01-30 NOTE — Progress Notes (Addendum)
Subjective: Darlene Mccormick is a 45 y.o. female patient who presents to office for evaluation of right foot pain patient reports that over the last 2 to 3 months she has noticed the second toe drifting and she has had increased pain to the base of the toe states that she noticed the toe looking a little funny when she was in her 55s and states that the pain has worsened with walking states that she is a Pharmacist, hospital at [employer]. Also admits to some skin changes on the left and denies any treatment at this time.  Patient Active Problem List   Diagnosis Date Noted   Insulin resistance 09/25/2019   NAFLD (nonalcoholic fatty liver disease) 09/25/2019   Prediabetes 06/08/2019   Class 3 severe obesity with serious comorbidity and body mass index (BMI) of 40.0 to 44.9 in adult [facility]) 05/17/2019   Fibroids 02/16/2018   S/P myomectomy 02/16/2018   Vitamin D deficiency 06/10/2016   GAD (generalized anxiety disorder) 06/10/2016   Hyperlipidemia, mixed 06/03/2016   Morbid obesity ([place]) 06/03/2016    Current Outpatient Medications on File Prior to Visit  Medication Sig Dispense Refill   B Complex-C-Folic Acid (HM SUPER VITAMIN B COMPLEX/C PO) Take by mouth.     fluticasone (FLONASE) 50 MCG/ACT nasal spray SHAKE LIQUID AND USE 2 SPRAYS IN EACH NOSTRIL DAILY 16 g 5   Magnesium 500 MG CAPS Take 500 mg by mouth daily.     Melatonin 10 MG CAPS Take by mouth.     MILK THISTLE PO Take 375 mg by mouth daily.     Multiple Vitamin (MULTIVITAMIN WITH MINERALS) TABS tablet Take 1 tablet by mouth daily.     Probiotic Product (PROBIOTIC DAILY PO) Take by mouth.     VITAMIN D PO Take by mouth.     No current facility-administered medications on file prior to visit.    No Known Allergies  Objective:  General: Alert and oriented x3 in no acute distress  Dermatology: No open lesions bilateral lower extremities, scaly skin on left foot,  no webspace macerations, no ecchymosis bilateral, all nails x 10 are  well manicured.  Vascular: Dorsalis Pedis and Posterior Tibial pedal pulses palpable, Capillary Fill Time 3 seconds,(+) pedal hair growth bilateral, no edema bilateral lower extremities, Temperature gradient within normal limits.  Neurology: Johney Maine sensation intact via light touch bilateral.  Musculoskeletal: Mild tenderness with palpation at right second metatarsophalangeal joint with significant swan-neck/varus rotated toe with the deformity at the level of the interphalangeal joint of the right second toe.  Strength within normal limits in all groups bilateral.    Xrays  Right foot   Impression: No fracture but there is subluxation of the right second toe without dorsal dislocation  Assessment and Plan: Problem List Items Addressed This Visit   None Visit Diagnoses     Pain of toe of right foot    -  Primary   Capsulitis       Hammertoe of second toe of right foot       Tinea           -Complete examination performed -Discussed possible tinea on left -Rx Mycolog ointment to use as directed -Xrays reviewed of the right foot as above -Discussed treatement options for likely capsulitis with toe deformity -Dispensed Darco toe alignment splint for patient to use at bedtime to help try to train the toe and prevent the toe from progressing more -Dispensed metatarsal pad sleeve for patient to wear during  the day to help further offload the second metatarsal phalangeal joint -Recommend good supportive shoes daily for foot type; shoe list provided -Advised patient if padding and splinting works well may benefit from orthotics however if pain continues may benefit from injection -Patient to return to office as scheduled in 6 to 8 weeks or sooner if condition worsens.  Landis Martins, DPM

## 2021-02-07 ENCOUNTER — Other Ambulatory Visit: Payer: Self-pay | Admitting: Sports Medicine

## 2021-02-07 DIAGNOSIS — M2041 Other hammer toe(s) (acquired), right foot: Secondary | ICD-10-CM

## 2021-04-03 ENCOUNTER — Ambulatory Visit: Payer: 59 | Admitting: Sports Medicine

## 2021-04-25 ENCOUNTER — Telehealth: Payer: Self-pay | Admitting: Family Medicine

## 2021-04-25 NOTE — Telephone Encounter (Signed)
Ok with me 

## 2021-04-25 NOTE — Telephone Encounter (Signed)
Patient called to schedule an appointment with Dr. Ethlyn Gallery for a physical and to update her vaccinations. Offered patient the next available (2/17). [place] and would like to be seen sooner.   Patient asked if she could transfer to Dr. Sarajane Jews, as she seen him before and liked him as a doctor. She is wanting to know if she could switch from Dr. Ethlyn Gallery to Dr. Sarajane Jews. She would like a doctor who is in office a little more with a little more availability.  Informed patient that a message could be sent back and once both doctors respond, someone would give her a call.   Patient can be reached at [phone].  Please advise.

## 2021-04-25 NOTE — Telephone Encounter (Signed)
I appreciate her asking but I am too full right now to take her

## 2021-04-25 NOTE — Telephone Encounter (Signed)
Please advise 

## 2021-04-30 NOTE — Telephone Encounter (Signed)
Message given to patient.

## 2021-05-01 DIAGNOSIS — Z0289 Encounter for other administrative examinations: Secondary | ICD-10-CM

## 2021-05-02 ENCOUNTER — Ambulatory Visit: Payer: 59 | Admitting: Family Medicine

## 2021-05-02 ENCOUNTER — Encounter: Payer: Self-pay | Admitting: Family Medicine

## 2021-05-02 VITALS — BP 122/82 | HR 82 | Temp 98.2°F | Ht 68.0 in | Wt 284.1 lb

## 2021-05-02 DIAGNOSIS — Z2839 Other underimmunization status: Secondary | ICD-10-CM

## 2021-05-02 DIAGNOSIS — Z6841 Body Mass Index (BMI) 40.0 and over, adult: Secondary | ICD-10-CM

## 2021-05-02 DIAGNOSIS — E8881 Metabolic syndrome: Secondary | ICD-10-CM | POA: Diagnosis not present

## 2021-05-02 DIAGNOSIS — R7303 Prediabetes: Secondary | ICD-10-CM

## 2021-05-02 DIAGNOSIS — Z23 Encounter for immunization: Secondary | ICD-10-CM

## 2021-05-02 DIAGNOSIS — K760 Fatty (change of) liver, not elsewhere classified: Secondary | ICD-10-CM | POA: Diagnosis not present

## 2021-05-02 LAB — COMPREHENSIVE METABOLIC PANEL
ALT: 47 U/L — ABNORMAL HIGH (ref 0–35)
AST: 30 U/L (ref 0–37)
Albumin: 4.3 g/dL (ref 3.5–5.2)
Alkaline Phosphatase: 56 U/L (ref 39–117)
BUN: 12 mg/dL (ref 6–23)
CO2: 29 mEq/L (ref 19–32)
Calcium: 9.1 mg/dL (ref 8.4–10.5)
Chloride: 103 mEq/L (ref 96–112)
Creatinine, Ser: 0.62 mg/dL (ref 0.40–1.20)
GFR: 108.53 mL/min (ref >60.00)
Glucose, Bld: 118 mg/dL — ABNORMAL HIGH (ref 70–99)
Potassium: 4.3 mEq/L (ref 3.5–5.1)
Sodium: 138 mEq/L (ref 135–145)
Total Bilirubin: 0.8 mg/dL (ref 0.2–1.2)
Total Protein: 7.1 g/dL (ref 6.0–8.3)

## 2021-05-02 LAB — HEMOGLOBIN A1C: Hgb A1c MFr Bld: 5.9 % (ref 4.6–6.5)

## 2021-05-02 MED ORDER — SEMAGLUTIDE-WEIGHT MANAGEMENT 1.7 MG/0.75ML ~~LOC~~ SOAJ: 1.7000 mg | SUBCUTANEOUS | 0 refills | Status: DC

## 2021-05-02 MED ORDER — SEMAGLUTIDE-WEIGHT MANAGEMENT 0.25 MG/0.5ML ~~LOC~~ SOAJ: 0.2500 mg | SUBCUTANEOUS | 0 refills | Status: DC

## 2021-05-02 MED ORDER — SEMAGLUTIDE-WEIGHT MANAGEMENT 0.5 MG/0.5ML ~~LOC~~ SOAJ: 0.5000 mg | SUBCUTANEOUS | 0 refills | Status: DC

## 2021-05-02 MED ORDER — SEMAGLUTIDE-WEIGHT MANAGEMENT 1 MG/0.5ML ~~LOC~~ SOAJ: 1.0000 mg | SUBCUTANEOUS | 0 refills | Status: DC

## 2021-05-02 MED ORDER — SEMAGLUTIDE-WEIGHT MANAGEMENT 2.4 MG/0.75ML ~~LOC~~ SOAJ: 2.4000 mg | SUBCUTANEOUS | 5 refills | Status: DC

## 2021-05-02 NOTE — Progress Notes (Signed)
Darlene Mccormick DOB: 1975-10-09 Encounter date: 05/02/2021  This is a 46 y.o. female who presents with Chief Complaint  Patient presents with   Immunizations    Patient requests vaccine information due to upcoming visit to [facility]    History of present illness: Will be in [facility] for a month starting in July; got full [employer].   Has had chicken pox in the past. Received childhood immunizations -should include hep B, DTaP, polio, MMR.  Interested in more help with weight loss. She has tried most ofdiets that are out there - [place], [facility], low carb, noom, [employer]. We did try metformin at one point, but she felt mentally foggy and it didn't help with weight loss.   Per cdc: she needs varicella, DTaP, flu, MMR, polio, shingles, COVID, hep A, hep B, rabies, typhoid.    No Known Allergies Current Meds  Medication Sig   B Complex-C-Folic Acid (HM SUPER VITAMIN B COMPLEX/C PO) Take by mouth.   Magnesium 500 MG CAPS Take 500 mg by mouth daily.   MILK THISTLE PO Take 375 mg by mouth daily.   Multiple Vitamin (MULTIVITAMIN WITH MINERALS) TABS tablet Take 1 tablet by mouth daily.   nystatin-triamcinolone ointment (MYCOLOG) Apply 1 application topically 2 (two) times daily.   Probiotic Product (PROBIOTIC DAILY PO) Take by mouth.   [START ON 05/31/2021] Semaglutide-Weight Management 0.5 MG/0.5ML SOAJ Inject 0.5 mg into the skin once a week for 28 days.   [START ON 06/29/2021] Semaglutide-Weight Management 1 MG/0.5ML SOAJ Inject 1 mg into the skin once a week for 28 days.   [START ON 07/28/2021] Semaglutide-Weight Management 1.7 MG/0.75ML SOAJ Inject 1.7 mg into the skin once a week for 28 days.   [START ON 08/26/2021] Semaglutide-Weight Management 2.4 MG/0.75ML SOAJ Inject 2.4 mg into the skin once a week for 28 days.   VITAMIN D PO Take by mouth.   [DISCONTINUED] Semaglutide-Weight Management 0.25 MG/0.5ML SOAJ Inject 0.25 mg into the skin once a week for 28  days.    Review of Systems  Constitutional:  Negative for chills, fatigue and fever.  Respiratory:  Negative for cough, chest tightness, shortness of breath and wheezing.   Cardiovascular:  Negative for chest pain, palpitations and leg swelling.   Objective:  BP 122/82 (BP Location: Left Arm, Patient Position: Sitting, Cuff Size: Large)    Pulse 82    Temp 98.2 F (36.8 C) (Oral)    Ht $R'5\' 8"'CG$  (1.727 m)    Wt 284 lb 1.6 oz (128.9 kg)    LMP 04/15/2021 (Exact Date)    SpO2 99%    BMI 43.20 kg/m   Weight: 284 lb 1.6 oz (128.9 kg)   BP Readings from Last 3 Encounters:  05/02/21 122/82  12/27/20 118/86  03/06/20 134/85   Wt Readings from Last 3 Encounters:  05/02/21 284 lb 1.6 oz (128.9 kg)  12/27/20 277 lb (125.6 kg)  03/06/20 268 lb (121.6 kg)    Physical Exam Constitutional:      General: She is not in acute distress.    Appearance: She is well-developed.  Cardiovascular:     Rate and Rhythm: Normal rate and regular rhythm.     Heart sounds: Normal heart sounds. No murmur heard.   No friction rub.  Pulmonary:     Effort: Pulmonary effort is normal. No respiratory distress.     Breath sounds: Normal breath sounds. No wheezing or rales.  Musculoskeletal:     Right lower leg: No  edema.     Left lower leg: No edema.  Neurological:     Mental Status: She is alert and oriented to person, place, and time.  Psychiatric:        Behavior: Behavior normal.    Assessment/Plan   1. Immunization deficiency Encouraged health dept for rabies, typhoid.  We will check titers for some of her childhood immunizations today to help direct further needed immunizations.  She believes that she had hepatitis A vaccination in the past.  - Hep A Antibody, Total; Future - Hepatitis B surface antibody,qualitative; Future - Varicella Zoster Antibody, IgG; Future  2. Need for Tdap vaccination Completed today  3. Class 3 severe obesity due to excess calories with serious comorbidity and body  mass index (BMI) of 40.0 to 44.9 in adult [facility]) - Semaglutide-Weight Management 0.25 MG/0.5ML SOAJ; Inject 0.25 mg into the skin once a week for 28 days.  Dispense: 2 mL; Refill: 0 Discussed new medication(s) today with patient. Discussed potential side effects and patient verbalized understanding.   4. Insulin resistance - Semaglutide-Weight Management 0.25 MG/0.5ML SOAJ; Inject 0.25 mg into the skin once a week for 28 days.  Dispense: 2 mL; Refill: 0  5. NAFLD (nonalcoholic fatty liver disease) - Semaglutide-Weight Management 0.25 MG/0.5ML SOAJ; Inject 0.25 mg into the skin once a week for 28 days.  Dispense: 2 mL; Refill: 0  6. Prediabetes - Hemoglobin A1c; Future - Comprehensive metabolic panel; Future - Semaglutide-Weight Management 0.25 MG/0.5ML SOAJ; Inject 0.25 mg into the skin once a week for 28 days.  Dispense: 2 mL; Refill: 0    Return in about 3 months (around 07/30/2021) for Chronic condition visit.    Micheline Rough, MD

## 2021-05-02 NOTE — Patient Instructions (Signed)
Call health department to ask about Rabies and typhoid prevention.

## 2021-05-05 LAB — MEASLES/MUMPS/RUBELLA IMMUNITY
Mumps IgG: 37.4 AU/mL
Rubella: 1.74 Index
Rubeola IgG: >300 AU/mL

## 2021-05-05 LAB — VARICELLA ZOSTER ANTIBODY, IGG: Varicella IgG: 914.7 index

## 2021-05-05 LAB — HEPATITIS B SURFACE ANTIBODY,QUALITATIVE: Hep B S Ab: NONREACTIVE

## 2021-05-05 LAB — HEPATITIS A ANTIBODY, TOTAL: Hepatitis A AB,Total: NONREACTIVE

## 2021-05-08 ENCOUNTER — Ambulatory Visit: Payer: 59 | Admitting: Sports Medicine

## 2021-05-13 ENCOUNTER — Encounter (INDEPENDENT_AMBULATORY_CARE_PROVIDER_SITE_OTHER): Payer: Self-pay | Admitting: Bariatrics

## 2021-05-13 ENCOUNTER — Other Ambulatory Visit: Payer: Self-pay

## 2021-05-13 ENCOUNTER — Ambulatory Visit (INDEPENDENT_AMBULATORY_CARE_PROVIDER_SITE_OTHER): Payer: 59 | Admitting: Bariatrics

## 2021-05-13 ENCOUNTER — Encounter: Payer: Self-pay | Admitting: Family Medicine

## 2021-05-13 VITALS — BP 149/89 | HR 81 | Temp 97.8°F | Ht 68.0 in | Wt 282.0 lb

## 2021-05-13 DIAGNOSIS — R7303 Prediabetes: Secondary | ICD-10-CM | POA: Diagnosis not present

## 2021-05-13 DIAGNOSIS — R5383 Other fatigue: Secondary | ICD-10-CM | POA: Diagnosis not present

## 2021-05-13 DIAGNOSIS — E559 Vitamin D deficiency, unspecified: Secondary | ICD-10-CM | POA: Diagnosis not present

## 2021-05-13 DIAGNOSIS — K760 Fatty (change of) liver, not elsewhere classified: Secondary | ICD-10-CM

## 2021-05-13 DIAGNOSIS — R0602 Shortness of breath: Secondary | ICD-10-CM

## 2021-05-13 DIAGNOSIS — E785 Hyperlipidemia, unspecified: Secondary | ICD-10-CM

## 2021-05-13 DIAGNOSIS — E668 Other obesity: Secondary | ICD-10-CM

## 2021-05-13 DIAGNOSIS — Z1331 Encounter for screening for depression: Secondary | ICD-10-CM

## 2021-05-13 DIAGNOSIS — E782 Mixed hyperlipidemia: Secondary | ICD-10-CM

## 2021-05-13 DIAGNOSIS — Z6841 Body Mass Index (BMI) 40.0 and over, adult: Secondary | ICD-10-CM

## 2021-05-13 NOTE — Progress Notes (Signed)
Chief Complaint:   OBESITY Jim Philemon (MR# [id number]) is a 46 y.o. female who presents for evaluation and treatment of obesity and related comorbidities. Current BMI is Body mass index is 42.88 kg/m. Jiali has been struggling with her weight for many years and has been unsuccessful in either losing weight, maintaining weight loss, or reaching her healthy weight goal.  Joda is a returning patient. Her last visit was with me was on 05/07/2019. She has taken any medications for weight loss in the past. . She states that she has increased hunger.   Melane is currently in the action stage of change and ready to dedicate time achieving and maintaining a healthier weight. Ennis is interested in becoming our patient and working on intensive lifestyle modifications including (but not limited to) diet and exercise for weight loss.  Janellie's habits were reviewed today and are as follows: Her family eats meals together, she thinks her family will eat healthier with her, she struggles with family and or coworkers weight loss sabotage, her desired weight loss is 90 pounds, she has been heavy most of her life, she started gaining weight 16-74 years old, her heaviest weight ever was 280 pounds, she has significant food cravings issues, she wakes up frequently in the middle of the night to eat, she is frequently drinking liquids with calories, she frequently makes poor food choices, she has problems with excessive hunger, she frequently eats larger portions than normal, she has binge eating behaviors, and she struggles with emotional eating.  Depression Screen Samary's Food and Mood (modified PHQ-9) score was 10.  Depression screen PHQ 2/9 05/13/2021  Decreased Interest 1  Down, Depressed, Hopeless 2  PHQ - 2 Score 3  Altered sleeping 1  Tired, decreased energy 1  Change in appetite 1  Feeling bad or failure about yourself  2  Trouble concentrating 1  Moving slowly or fidgety/restless 1  Suicidal  thoughts 0  PHQ-9 Score 10  Difficult doing work/chores Somewhat difficult   Subjective:   1. Other fatigue Bruchy will continue activities.Yohanna admits to daytime somnolence and admits to waking up still tired. Patient has a history of symptoms of daytime fatigue and morning fatigue. Saree generally gets 7 hours of sleep per night, and states that she has generally restful sleep. Snoring is present. Apneic episodes are not present. Epworth Sleepiness Score is 5.    2. SOB (shortness of breath) on exertion Archana will continue activities. Tyera notes increasing shortness of breath with exercising and seems to be worsening over time with weight gain. She notes getting out of breath sooner with activity than she used to. This has not gotten worse recently. Pami denies shortness of breath at rest or orthopnea.   3. NAFLD (nonalcoholic fatty liver disease) Jeyla had ultrasound test on 11/2017 hepatis steatosis.  4. Pre-diabetes Ryen is not on medications currently. Her last A1C was 5.9.  5. Vitamin D deficiency Pierrette is taking multi-vitamin, magnesium, milk thistle and dandelion root.   6. Hyperlipidemia, mixed We discussed hyperlipidemia.  7. Elevated lipids We discussed lipids today.  Assessment/Plan:   1. Other fatigue Samanatha will gradually increase activities. We discussed bioimpedance and indirect calorimetry She does feel that her weight is causing her energy to be lower than it should be. Fatigue may be related to obesity, depression or many other causes. Labs will be ordered, and in the meanwhile, Zanyah will focus on self care including making healthy food choices, increasing physical activity and focusing  on stress reduction.   2. SOB (shortness of breath) on exertion Lexie will gradually increase activities. We discussed bioimpedance and indirect calorimetry. Webb Silversmith does feel that she gets out of breath more easily that she used to when she exercises. Helina's shortness of breath appears to  be obesity related and exercise induced. She has agreed to work on weight loss and gradually increase exercise to treat her exercise induced shortness of breath. Will continue to monitor closely.   3. NAFLD (nonalcoholic fatty liver disease) Larraine will work on the plan and exercise. We will check labs today.We discussed the likely diagnosis of non-alcoholic fatty liver disease today and how this condition is obesity related. Persis was educated the importance of weight loss. Shereta agreed to continue with her weight loss efforts with healthier diet and exercise as an essential part of her treatment plan.   - Comprehensive metabolic panel  4. Pre-diabetes Vernida will continue to work on the plan and exercise. We will check insulin and CMP today. She will continue to work on weight loss, exercise, and decreasing simple carbohydrates to help decrease the risk of diabetes.   - Comprehensive metabolic panel - Insulin, random  5. Vitamin D deficiency Low Vitamin D level contributes to fatigue and are associated with obesity, breast, and colon cancer. We will check Vitamin D today. Orine agrees to continue to take prescription Vitamin D 50,000 IU every week and will follow-up for routine testing of Vitamin D, at least 2-3 times per year to avoid over-replacement.  - VITAMIN D 25 Hydroxy (Vit-D Deficiency, Fractures)  6. Hyperlipidemia, mixed Cardiovascular risk and specific lipid/LDL goals reviewed.  We will check CMP and Lipids today. We discussed several lifestyle modifications today and Nazyia will continue to work on diet, exercise and weight loss efforts. Orders and follow up as documented in patient record.   Counseling Intensive lifestyle modifications are the first line treatment for this issue. Dietary changes: Increase soluble fiber. Decrease simple carbohydrates. Exercise changes: Moderate to vigorous-intensity aerobic activity 150 minutes per week if tolerated. Lipid-lowering medications: see  documented in medical record.  - Comprehensive metabolic panel - Lipid Panel With LDL/HDL Ratio  7. Elevated lipids Cardiovascular risk and specific lipid/LDL goals reviewed.  We will check Lipids today. We discussed several lifestyle modifications today and Claude will continue to work on diet, exercise and weight loss efforts. Orders and follow up as documented in patient record.   Counseling Intensive lifestyle modifications are the first line treatment for this issue. Dietary changes: Increase soluble fiber. Decrease simple carbohydrates. Exercise changes: Moderate to vigorous-intensity aerobic activity 150 minutes per week if tolerated. Lipid-lowering medications: see documented in medical record.  8. Depression screen Laraine had a positive depression screening. Depression is commonly associated with obesity and often results in emotional eating behaviors. We will monitor this closely and work on CBT to help improve the non-hunger eating patterns. Referral to Psychology may be required if no improvement is seen as she continues in our clinic.   9. Class 3 severe obesity due to excess calories with serious comorbidity and body mass index (BMI) of 40.0 to 44.9 in adult [facility]) Juvia is currently in the action stage of change and her goal is to continue with weight loss efforts. I recommend Destenee begin the structured treatment plan as follows:  She has agreed to the Category 4 Plan and keeping a food journal and adhering to recommended goals of 1800 calories and 100 grams of protein.  Temprence will continue  meal planning and she will continue intentional eating. We reviewed labs from 05/02/2021 CMP, Lipids, gulcose, A1C and thyroid panel.   Exercise goals:  Jayna is swimming 2-3 times per week and walking.    Behavioral modification strategies: increasing lean protein intake, decreasing simple carbohydrates, increasing vegetables, increasing water intake, decreasing eating out, no skipping meals, meal  planning and cooking strategies, keeping healthy foods in the home, and planning for success.  She was informed of the importance of frequent follow-up visits to maximize her success with intensive lifestyle modifications for her multiple health conditions. She was informed we would discuss her lab results at her next visit unless there is a critical issue that needs to be addressed sooner. Devanshi agreed to keep her next visit at the agreed upon time to discuss these results.  Objective:   Pulse 81, temperature 97.8 F (36.6 C), height 5\' 8"  (1.727 m), weight 282 lb (127.9 kg), last menstrual period 05/11/2021, SpO2 100 %. Body mass index is 42.88 kg/m.  EKG: Normal sinus rhythm, rate unable to obtain.  Indirect Calorimeter completed today shows a VO2 of 391 and a REE of 2702.  Her calculated basal metabolic rate is 4580 thus her basal metabolic rate is better than expected.  General: Cooperative, alert, well developed, in no acute distress. HEENT: Conjunctivae and lids unremarkable. Cardiovascular: Regular rhythm.  Lungs: Normal work of breathing. Neurologic: No focal deficits.   Lab Results  Component Value Date   CREATININE 0.62 05/02/2021   BUN 12 05/02/2021   NA 138 05/02/2021   K 4.3 05/02/2021   CL 103 05/02/2021   CO2 29 05/02/2021   Lab Results  Component Value Date   ALT 47 (H) 05/02/2021   AST 30 05/02/2021   ALKPHOS 56 05/02/2021   BILITOT 0.8 05/02/2021   Lab Results  Component Value Date   HGBA1C 5.9 05/02/2021   HGBA1C 6.0 12/27/2020   HGBA1C 6.0 (H) 02/08/2020   HGBA1C 5.6 08/23/2019   HGBA1C 5.7 (H) 05/03/2019   Lab Results  Component Value Date   INSULIN 23.3 03/06/2020   INSULIN 12.7 08/23/2019   INSULIN 16.2 05/03/2019   Lab Results  Component Value Date   TSH 1.41 12/27/2020   Lab Results  Component Value Date   CHOL 238 (H) 12/27/2020   HDL 38.20 (L) 12/27/2020   LDLCALC 148 (H) 03/06/2020   LDLDIRECT 170.0 12/27/2020   TRIG 250.0 (H)  12/27/2020   CHOLHDL 6 12/27/2020   Lab Results  Component Value Date   WBC 6.2 12/27/2020   HGB 12.6 12/27/2020   HCT 38.0 12/27/2020   MCV 86.8 12/27/2020   PLT 394.0 12/27/2020   Lab Results  Component Value Date   IRON 62 01/05/2019   FERRITIN 57.7 01/05/2019   Attestation Statements:   Reviewed by clinician on day of visit: allergies, medications, problem list, medical history, surgical history, family history, social history, and previous encounter notes.  Time spent on visit including pre-visit chart review and post-visit charting and care was 65 minutes.   I, Lizbeth Bark, RMA, am acting as Location manager for [employer], DO.  I have reviewed the above documentation for accuracy and completeness, and I agree with the above. Jearld Lesch, DO

## 2021-05-14 LAB — COMPREHENSIVE METABOLIC PANEL
ALT: 40 IU/L — ABNORMAL HIGH (ref 0–32)
AST: 23 IU/L (ref 0–40)
Albumin/Globulin Ratio: 2.1 (ref 1.2–2.2)
Albumin: 4.5 g/dL (ref 3.8–4.8)
Alkaline Phosphatase: 72 IU/L (ref 44–121)
BUN/Creatinine Ratio: 15 (ref 9–23)
BUN: 9 mg/dL (ref 6–24)
Bilirubin Total: 0.6 mg/dL (ref 0.0–1.2)
CO2: 23 mmol/L (ref 20–29)
Calcium: 8.6 mg/dL — ABNORMAL LOW (ref 8.7–10.2)
Chloride: 102 mmol/L (ref 96–106)
Creatinine, Ser: 0.59 mg/dL (ref 0.57–1.00)
Globulin, Total: 2.1 g/dL (ref 1.5–4.5)
Glucose: 102 mg/dL — ABNORMAL HIGH (ref 70–99)
Potassium: 4.7 mmol/L (ref 3.5–5.2)
Sodium: 139 mmol/L (ref 134–144)
Total Protein: 6.6 g/dL (ref 6.0–8.5)
eGFR: 114 mL/min/{1.73_m2} (ref >59)

## 2021-05-14 LAB — LIPID PANEL WITH LDL/HDL RATIO
Cholesterol, Total: 230 mg/dL — ABNORMAL HIGH (ref 100–199)
HDL: 39 mg/dL — ABNORMAL LOW (ref >39)
LDL Chol Calc (NIH): 149 mg/dL — ABNORMAL HIGH (ref 0–99)
LDL/HDL Ratio: 3.8 ratio — ABNORMAL HIGH (ref 0.0–3.2)
Triglycerides: 232 mg/dL — ABNORMAL HIGH (ref 0–149)
VLDL Cholesterol Cal: 42 mg/dL — ABNORMAL HIGH (ref 5–40)

## 2021-05-14 LAB — INSULIN, RANDOM: INSULIN: 14.7 u[IU]/mL (ref 2.6–24.9)

## 2021-05-14 LAB — VITAMIN D 25 HYDROXY (VIT D DEFICIENCY, FRACTURES): Vit D, 25-Hydroxy: 61.3 ng/mL (ref 30.0–100.0)

## 2021-05-15 ENCOUNTER — Encounter (INDEPENDENT_AMBULATORY_CARE_PROVIDER_SITE_OTHER): Payer: Self-pay | Admitting: Bariatrics

## 2021-05-16 NOTE — Telephone Encounter (Signed)
Fax sent to be scanned.

## 2021-05-20 ENCOUNTER — Telehealth: Payer: Self-pay | Admitting: Family Medicine

## 2021-05-20 NOTE — Telephone Encounter (Signed)
Patient called in stating that she was denied for her Wegovy medication and she would like to speak to someone about starting an appeal on it.  Please advise.

## 2021-05-21 NOTE — Telephone Encounter (Signed)
I'm getting in touch with her weight loss doc to see if there is better way to work through this. It has been my experience that appeal would be more likely to work if they are giving [place] other meds to try first and we have tried/failed other options so that we can proceed to the med we would like.   It may be helpful while i'm touching base with her weight loss doc if she could touch base with insurance and ask them: what weight loss medications are covered (if any)? Are there alternatives (like saxenda - only other weight loss med in same category) that are covered or need to be tried first?   And did she try to get coupon for wegovy online?

## 2021-05-21 NOTE — Telephone Encounter (Signed)
Patient informed of the message below.  Patient states she talked with the insurance and was told weight loss meds are not covered, there are no alternatives, if the PCP did an appeal stating the medication was medically necessary with no other options, this would be covered.  Message sent to PCP.

## 2021-05-23 ENCOUNTER — Encounter: Payer: Self-pay | Admitting: Family Medicine

## 2021-05-23 NOTE — Telephone Encounter (Signed)
I wrote letter and this can be sent to [facility] department of Insurance; [employer] [place] at [employer] [facility], [place] [address] (taken from patient's denial letter). Patient can also write a letter to them or call to appeal for herself.   Just so she knows even with our best efforts,sometimes things are set in stone for what is covered. sometimes when you talk with them they make it sound like if we write/file appeal they will cover, but this is not the case much of the time.

## 2021-05-23 NOTE — Telephone Encounter (Signed)
Letter mailed and I left a detailed message at the patient's cell number with the information below.

## 2021-05-27 ENCOUNTER — Other Ambulatory Visit: Payer: Self-pay

## 2021-05-27 ENCOUNTER — Encounter (INDEPENDENT_AMBULATORY_CARE_PROVIDER_SITE_OTHER): Payer: Self-pay | Admitting: Bariatrics

## 2021-05-27 ENCOUNTER — Ambulatory Visit (INDEPENDENT_AMBULATORY_CARE_PROVIDER_SITE_OTHER): Payer: 59 | Admitting: Bariatrics

## 2021-05-27 VITALS — BP 130/82 | HR 87 | Temp 98.2°F | Ht 68.0 in | Wt 279.0 lb

## 2021-05-27 DIAGNOSIS — E8881 Metabolic syndrome: Secondary | ICD-10-CM

## 2021-05-27 DIAGNOSIS — E669 Obesity, unspecified: Secondary | ICD-10-CM | POA: Diagnosis not present

## 2021-05-27 DIAGNOSIS — Z6841 Body Mass Index (BMI) 40.0 and over, adult: Secondary | ICD-10-CM

## 2021-05-27 DIAGNOSIS — E7849 Other hyperlipidemia: Secondary | ICD-10-CM | POA: Diagnosis not present

## 2021-05-28 ENCOUNTER — Telehealth: Payer: Self-pay | Admitting: Family Medicine

## 2021-05-28 NOTE — Telephone Encounter (Signed)
Approval from PCP is noted in the results from 05/02/2021.  I left a detailed message at the patient's cell number stating she can call to schedule a nurses visit for the vaccines. ?

## 2021-05-28 NOTE — Progress Notes (Signed)
Chief Complaint:   OBESITY Darlene Mccormick is here to discuss her progress with her obesity treatment plan along with follow-up of her obesity related diagnoses. Darlene Mccormick is on the Category 4 Plan and states she is following her eating plan approximately 70% of the time. Darlene Mccormick states she is walking for 30 minutes 3 times per week and swimming for 30 minutes 2-3 times per week.  Today's visit was #: 2 Starting weight: 282 lbs Starting date: 05/13/2021 Today's weight: 279 lbs Today's date: 05/28/2021 Total lbs lost to date: 3 lbs Total lbs lost since last in-office visit: 3 lbs  Interim History: Darlene Mccormick is down 3 lbs since her initial visit. She did well with the plan.   Subjective:   1. Other hyperlipidemia Darlene Mccormick is not on medications currently. Her HDL was low. Her Triglycerides was increased.   2. Hypocalcemia Darlene Mccormick is taking multivitamins with minerals.   3. Insulin resistance Darlene Mccormick is not on medications. She tried Metformin but has muscle weakness as side effects. Her insulin was 14.7.  Assessment/Plan:   1. Other hyperlipidemia Cardiovascular risk and specific lipid/LDL goals reviewed.  Darlene Mccormick will have zero trans fats. She will minimize saturated fats except dark chocolate, dairy and unprocessed meats. We discussed several lifestyle modifications today and Darlene Mccormick will continue to work on diet, exercise and weight loss efforts. Orders and follow up as documented in patient record.   Counseling Intensive lifestyle modifications are the first line treatment for this issue. Dietary changes: Increase soluble fiber. Decrease simple carbohydrates. Exercise changes: Moderate to vigorous-intensity aerobic activity 150 minutes per week if tolerated. Lipid-lowering medications: see documented in medical record.  2. Hypocalcemia Darlene Mccormick will begin calcium 600 mg over the counter daily and multivitamins.   3. Insulin resistance Darlene Mccormick will minimize starches and sweets. Handouts for insulin resistance and  pre-diabetes was provided today. She will continue to work on weight loss, exercise, and decreasing simple carbohydrates to help decrease the risk of diabetes. Darlene Mccormick agreed to follow-up with [place] as directed to closely monitor her progress.  4. obesity, current BMI 42.5 Darlene Mccormick is currently in the action stage of change. As such, her goal is to continue with weight loss efforts. She has agreed to the Category 4 Plan.   Darlene Mccormick will continue meal planning. She will adhere closely to the plan 80-90%. We reviewed recent labs CMP, Lipids, Vitamin D, and glucose. She will increase her water intake. [employer] [place] [employer] handout was provided today.   Exercise goals:  As is.  Behavioral modification strategies: increasing lean protein intake, decreasing simple carbohydrates, increasing vegetables, increasing water intake, decreasing eating out, no skipping meals, meal planning and cooking strategies, keeping healthy foods in the home, and planning for success.  Darlene Mccormick has agreed to follow-up with our clinic in 2 weeks. She was informed of the importance of frequent follow-up visits to maximize her success with intensive lifestyle modifications for her multiple health conditions.   Objective:   Blood pressure 130/82, pulse 87, temperature 98.2 F (36.8 C), height 5\' 8"  (1.727 m), weight 279 lb (126.6 kg), last menstrual period 05/11/2021, SpO2 98 %. Body mass index is 42.42 kg/m.  General: Cooperative, alert, well developed, in no acute distress. HEENT: Conjunctivae and lids unremarkable. Cardiovascular: Regular rhythm.  Lungs: Normal work of breathing. Neurologic: No focal deficits.   Lab Results  Component Value Date   CREATININE 0.59 05/13/2021   BUN 9 05/13/2021   NA 139 05/13/2021   K 4.7 05/13/2021   CL 102  05/13/2021   CO2 23 05/13/2021   Lab Results  Component Value Date   ALT 40 (H) 05/13/2021   AST 23 05/13/2021   ALKPHOS 72 05/13/2021   BILITOT 0.6 05/13/2021   Lab Results  Component Value  Date   HGBA1C 5.9 05/02/2021   HGBA1C 6.0 12/27/2020   HGBA1C 6.0 (H) 02/08/2020   HGBA1C 5.6 08/23/2019   HGBA1C 5.7 (H) 05/03/2019   Lab Results  Component Value Date   INSULIN 14.7 05/13/2021   INSULIN 23.3 03/06/2020   INSULIN 12.7 08/23/2019   INSULIN 16.2 05/03/2019   Lab Results  Component Value Date   TSH 1.41 12/27/2020   Lab Results  Component Value Date   CHOL 230 (H) 05/13/2021   HDL 39 (L) 05/13/2021   LDLCALC 149 (H) 05/13/2021   LDLDIRECT 170.0 12/27/2020   TRIG 232 (H) 05/13/2021   CHOLHDL 6 12/27/2020   Lab Results  Component Value Date   VD25OH 61.3 05/13/2021   VD25OH 50.7 02/08/2020   VD25OH 63.1 08/23/2019   Lab Results  Component Value Date   WBC 6.2 12/27/2020   HGB 12.6 12/27/2020   HCT 38.0 12/27/2020   MCV 86.8 12/27/2020   PLT 394.0 12/27/2020   Lab Results  Component Value Date   IRON 62 01/05/2019   FERRITIN 57.7 01/05/2019   Attestation Statements:   Reviewed by clinician on day of visit: allergies, medications, problem list, medical history, surgical history, family history, social history, and previous encounter notes.  I, Lizbeth Bark, RMA, am acting as Location manager for [employer], DO.  I have reviewed the above documentation for accuracy and completeness, and I agree with the above. Darlene Lesch, DO

## 2021-05-28 NOTE — Telephone Encounter (Signed)
Patient called in requesting an appointment for her Hep A and Hep B vaccinations. ? ?Please advise. ?

## 2021-05-29 ENCOUNTER — Ambulatory Visit: Payer: 59 | Admitting: Sports Medicine

## 2021-05-29 ENCOUNTER — Encounter (INDEPENDENT_AMBULATORY_CARE_PROVIDER_SITE_OTHER): Payer: Self-pay

## 2021-05-29 ENCOUNTER — Encounter (INDEPENDENT_AMBULATORY_CARE_PROVIDER_SITE_OTHER): Payer: Self-pay | Admitting: Bariatrics

## 2021-05-29 ENCOUNTER — Telehealth (INDEPENDENT_AMBULATORY_CARE_PROVIDER_SITE_OTHER): Payer: Self-pay

## 2021-05-29 NOTE — Telephone Encounter (Signed)
FAXED DOCUMENTS ? ?Company: [employer] (Faxed on 05/29/21) ?Document: Provider written letter of explanation as to why patient needs Wegovy denial reversed. ?Other records requested: NA ? ?All above requested information has been faxed successfully to [employer] listed above. Documents and fax confirmation have been placed in the faxed file for future reference. ? ?

## 2021-05-30 ENCOUNTER — Telehealth: Payer: Self-pay | Admitting: Family Medicine

## 2021-05-30 NOTE — Telephone Encounter (Signed)
[place] of insurance received the letter of appeal for wegovy . The letter was sent to wrong office. Please advise ?

## 2021-06-03 NOTE — Telephone Encounter (Signed)
Will correct. The fax was sent to the contact information provided by pt's insurance company when they were contacted prior to sending.  ?

## 2021-06-04 ENCOUNTER — Ambulatory Visit (INDEPENDENT_AMBULATORY_CARE_PROVIDER_SITE_OTHER): Payer: 59 | Admitting: *Deleted

## 2021-06-04 DIAGNOSIS — Z23 Encounter for immunization: Secondary | ICD-10-CM

## 2021-06-04 NOTE — Telephone Encounter (Signed)
Waiting on signature from provider to refax letter. ?

## 2021-06-05 NOTE — Telephone Encounter (Signed)
Document faxed to insurance company. ?

## 2021-06-09 ENCOUNTER — Encounter: Payer: Self-pay | Admitting: Family Medicine

## 2021-06-10 ENCOUNTER — Telehealth: Payer: Self-pay | Admitting: *Deleted

## 2021-06-10 NOTE — Telephone Encounter (Signed)
Mychart message sent.

## 2021-06-11 NOTE — Telephone Encounter (Signed)
I wasn't questioning why she wrote letter. I just wanted her letter forwarded to insurance so that they could have both my letter and her letter.  ?

## 2021-06-12 ENCOUNTER — Ambulatory Visit: Payer: 59 | Admitting: Sports Medicine

## 2021-06-12 ENCOUNTER — Other Ambulatory Visit: Payer: Self-pay

## 2021-06-12 DIAGNOSIS — M2041 Other hammer toe(s) (acquired), right foot: Secondary | ICD-10-CM | POA: Diagnosis not present

## 2021-06-12 DIAGNOSIS — B359 Dermatophytosis, unspecified: Secondary | ICD-10-CM | POA: Diagnosis not present

## 2021-06-12 DIAGNOSIS — M79674 Pain in right toe(s): Secondary | ICD-10-CM | POA: Diagnosis not present

## 2021-06-12 DIAGNOSIS — M779 Enthesopathy, unspecified: Secondary | ICD-10-CM | POA: Diagnosis not present

## 2021-06-12 MED ORDER — TERBINAFINE HCL 250 MG PO TABS: 250.0000 mg | ORAL_TABLET | Freq: Every day | ORAL | 0 refills | Status: DC

## 2021-06-12 MED ORDER — NYSTATIN-TRIAMCINOLONE 100000-0.1 UNIT/GM-% EX OINT: 1.0000 "application " | TOPICAL_OINTMENT | Freq: Two times a day (BID) | CUTANEOUS | 0 refills | Status: DC

## 2021-06-12 NOTE — Patient Instructions (Addendum)
Vinegar soaks ?1 cup of white distilled vinegar to 8 cups of warm water.  Soak 20 mins. May repeat soak once weekly and use foot file as needed. ? ? ?Shoe List ?For tennis shoes recommend:  ?Darlene Mccormick ?Asics ?New balance ?[place] ?HOKA ?Can be purchased at [employer] sports or [employer] ?Sketchers arch fit ?Can be purchased at any major retailers ? ?Vionic  ?SAS ?Can be purchased at [place] or [place]  ? ?For work shoes recommend: ?Runner, broadcasting/film/video Work ?Timberland boots  ?Redwing boots ?Can be purchased at a variety of places or [employer]  ? ?For casual shoes recommend: ?Oofos ?Can be purchased at [employer] sports or [employer] ?Vionic  ?Can be purchased at [place] or [place]  ? ?Orthotics  ?For Over-the-Counter Orthotics recommend: ?Power Steps ?Can be purchased in office/[facility] ?Superfeet ?Can be purchased at [employer] sports or [employer] ?Airplus ?Can be purchased at [facility] ?For Custom Orthotics recommend: ?Custom fitting/Appointment at [place] [facility] [place] ?

## 2021-06-12 NOTE — Progress Notes (Signed)
Subjective: ?Darlene Mccormick is a 46 y.o. female patient who returns to the office for follow-up evaluation of left foot tinea and pain at the second toe on the right.  Patient reports that the pain previous padding did not seem to do much her toe still want to pull slightly to the right and states that sometimes she still has a little discomfort at most 2 out of 10 but wants to be as preventative as we can to prevent her toe from getting more painful.  Patient denies any other pedal complaints or injury at this time. ? ?Patient Active Problem List  ? Diagnosis Date Noted  ? Hypocalcemia 05/15/2021  ? Insulin resistance 09/25/2019  ? NAFLD (nonalcoholic fatty liver disease) 09/25/2019  ? Prediabetes 06/08/2019  ? Class 3 severe obesity with serious comorbidity and body mass index (BMI) of 40.0 to 44.9 in adult [facility]) 05/17/2019  ? Fibroids 02/16/2018  ? S/P myomectomy 02/16/2018  ? Vitamin D deficiency 06/10/2016  ? GAD (generalized anxiety disorder) 06/10/2016  ? Hyperlipidemia, mixed 06/03/2016  ? Morbid obesity ([place]) 06/03/2016  ? ? ?Current Outpatient Medications on File Prior to Visit  ?Medication Sig Dispense Refill  ? Magnesium 500 MG CAPS Take 500 mg by mouth daily.    ? Multiple Vitamin (MULTIVITAMIN WITH MINERALS) TABS tablet Take 1 tablet by mouth daily.    ? ?No current facility-administered medications on file prior to visit.  ? ? ?No Known Allergies ? ?Objective:  ?General: Alert and oriented x3 in no acute distress ? ?Dermatology: No open lesions bilateral lower extremities, scaly skin on left foot in a moccasin distribution likely consistent with tinea versus a contact dermatitis,  no webspace macerations, no ecchymosis bilateral, all nails x 10 are well manicured. ? ?Vascular: Dorsalis Pedis and Posterior Tibial pedal pulses palpable, Capillary Fill Time 3 seconds,(+) pedal hair growth bilateral, no edema bilateral lower extremities, Temperature gradient within normal limits. ? ?Neurology:  Gross sensation intact via light touch bilateral. ? ?Musculoskeletal: Minimal tenderness with palpation at right second metatarsophalangeal joint with swan-neck/varus rotated toe with the deformity at the level of the interphalangeal joint of the right second toe and mild deviation at the level of the metatarsophalangeal joint Lachman's test is negative there is no major pain as noted above with palpation to the toe or metatarsal phalangeal joint.  Strength within normal limits in all groups bilateral.  ? ?Assessment and Plan: ?Problem List Items Addressed This Visit   ?None ?Visit Diagnoses   ? ? Tinea    -  Primary  ? Left foot  ? Relevant Medications  ? nystatin-triamcinolone ointment (MYCOLOG)  ? terbinafine (LAMISIL) 250 MG tablet  ? Pain of toe of right foot      ? Capsulitis      ? Hammertoe of second toe of right foot      ? 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 ?  ? ? ?-Complete examination performed ?-Discussed continued plan of care for tinea versus dermatitis on the left ?-Rx oral Lamisil for 30 days for patient to take as directed if skin changes have not 100% resolved we will do blood work at next visit and continue her on Lamisil for a total of 90 days ?-Refill Mycolog ointment to use as directed ?-Discussed treatement options for likely capsulitis with toe deformity/predislocation syndrome ?-Advised close self monitoring and pics monthly wb and nonwb to assess 2nd toe on right ?-Advised patient at this time  since pain is very minimal I do not recommend anything surgical however it is very important to continue with cushioning padding splinting and good supportive shoes; shoe list was provided ?-Office to check orthotic benefits and advised patient to cover I do recommend getting custom orthotics to offload the second metatarsophalangeal joint on the right ?-At this time do not recommend injection since pain is very minimal office to check orthotic  benefits ?-Patient to return to office in 1 months for tinea check up or sooner if symptoms worsen.  ?Landis Martins, DPM ? ? ?

## 2021-06-19 ENCOUNTER — Ambulatory Visit (INDEPENDENT_AMBULATORY_CARE_PROVIDER_SITE_OTHER): Payer: 59 | Admitting: Bariatrics

## 2021-06-19 ENCOUNTER — Other Ambulatory Visit: Payer: Self-pay

## 2021-06-19 ENCOUNTER — Encounter (INDEPENDENT_AMBULATORY_CARE_PROVIDER_SITE_OTHER): Payer: Self-pay | Admitting: Bariatrics

## 2021-06-19 VITALS — BP 139/87 | HR 74 | Temp 97.5°F | Ht 68.0 in | Wt 281.0 lb

## 2021-06-19 DIAGNOSIS — E8881 Metabolic syndrome: Secondary | ICD-10-CM

## 2021-06-19 DIAGNOSIS — Z6841 Body Mass Index (BMI) 40.0 and over, adult: Secondary | ICD-10-CM

## 2021-06-19 DIAGNOSIS — E782 Mixed hyperlipidemia: Secondary | ICD-10-CM

## 2021-06-19 DIAGNOSIS — E669 Obesity, unspecified: Secondary | ICD-10-CM | POA: Diagnosis not present

## 2021-06-19 MED ORDER — PHENTERMINE HCL 15 MG PO CAPS: 15.0000 mg | ORAL_CAPSULE | ORAL | 0 refills | Status: DC

## 2021-06-19 MED ORDER — TOPIRAMATE 50 MG PO TABS: 50.0000 mg | ORAL_TABLET | Freq: Every day | ORAL | 0 refills | Status: DC

## 2021-06-23 ENCOUNTER — Encounter (INDEPENDENT_AMBULATORY_CARE_PROVIDER_SITE_OTHER): Payer: Self-pay | Admitting: Bariatrics

## 2021-06-23 NOTE — Progress Notes (Signed)
? ? ? ?Chief Complaint:  ? ?OBESITY ?Darlene Mccormick is here to discuss her progress with her obesity treatment plan along with follow-up of her obesity related diagnoses. Kaytlin is on keeping a food journal and adhering to recommended goals of 1800 calories and 90 grams of protein and states she is following her eating plan approximately 50% of the time. Janise states she is doing 0 minutes 0 times per week. ? ?Today's visit was #: 3 ?Starting weight: 282 lbs ?Starting date: 05/13/2021 ?Today's weight: 281 lbs ?Today's date: 06/19/2021 ?Total lbs lost to date: 1 lb ?Total lbs lost since last in-office visit: 0 ? ?Interim History: Ophelia is up 2 lbs since her last visit. She has been more stressed and less delinquent (eating at weird hours).  ? ?Subjective:  ? ?1. Insulin resistance ?Ciin is not on medications currently. ? ?2. Hyperlipidemia, mixed ?Zetha is currently not on medications.  ? ?Assessment/Plan:  ? ?1. Insulin resistance ?Michaelina will adhere closely to the plan. She will continue to work on weight loss, exercise, and decreasing simple carbohydrates to help decrease the risk of diabetes. Arelia agreed to follow-up with [place] as directed to closely monitor her progress. ? ?2. Hyperlipidemia, mixed ?Cardiovascular risk and specific lipid/LDL goals reviewed.  Trina will read labels. She will have no trans fats. We discussed several lifestyle modifications today and Lashawndra will continue to work on diet, exercise and weight loss efforts. Orders and follow up as documented in patient record.  ? ?Counseling ?Intensive lifestyle modifications are the first line treatment for this issue. ?Dietary changes: Increase soluble fiber. Decrease simple carbohydrates. ?Exercise changes: Moderate to vigorous-intensity aerobic activity 150 minutes per week if tolerated. ?Lipid-lowering medications: see documented in medical record. ? ?3. obesity, current BMI 42.7 ?Kaianna is currently in the action stage of change. As such, her goal is to continue with  weight loss efforts. She has agreed to keeping a food journal and adhering to recommended goals of 1800 calories and 90 grams of protein.  ? ?Dezeray will adhere more closely to her calories and protein goals. She will get back on track. She will increase her water and protein intake. We will refill Topamax 50 mg for 1 month with no refills. We will refill phentermine 15 mg for 1 month with no refills.  ? ?- phentermine 15 MG capsule; Take 1 capsule (15 mg total) by mouth every morning.  Dispense: 30 capsule; Refill: 0 ?- topiramate (TOPAMAX) 50 MG tablet; Take 1 tablet (50 mg total) by mouth daily with supper.  Dispense: 30 tablet; Refill: 0 ? ?Exercise goals: No exercise has been prescribed at this time. ? ?Behavioral modification strategies: increasing lean protein intake, decreasing simple carbohydrates, increasing vegetables, increasing water intake, decreasing eating out, no skipping meals, meal planning and cooking strategies, keeping healthy foods in the home, and planning for success. ? ?Sharica has agreed to follow-up with our clinic in 2 weeks with Jake Bathe, FNP or Everardo Pacific, FNP and 4 weeks with myself. She was informed of the importance of frequent follow-up visits to maximize her success with intensive lifestyle modifications for her multiple health conditions.  ? ?Objective:  ? ?Blood pressure 139/87, pulse 74, temperature (!) 97.5 ?F (36.4 ?C), height '5\' 8"'$  (1.727 m), weight 281 lb (127.5 kg), SpO2 97 %. ?Body mass index is 42.73 kg/m?. ? ?General: Cooperative, alert, well developed, in no acute distress. ?HEENT: Conjunctivae and lids unremarkable. ?Cardiovascular: Regular rhythm.  ?Lungs: Normal work of breathing. ?Neurologic: No focal deficits.  ? ?  Lab Results  ?Component Value Date  ? CREATININE 0.59 05/13/2021  ? BUN 9 05/13/2021  ? NA 139 05/13/2021  ? K 4.7 05/13/2021  ? CL 102 05/13/2021  ? CO2 23 05/13/2021  ? ?Lab Results  ?Component Value Date  ? ALT 40 (H) 05/13/2021  ? AST 23  05/13/2021  ? ALKPHOS 72 05/13/2021  ? BILITOT 0.6 05/13/2021  ? ?Lab Results  ?Component Value Date  ? HGBA1C 5.9 05/02/2021  ? HGBA1C 6.0 12/27/2020  ? HGBA1C 6.0 (H) 02/08/2020  ? HGBA1C 5.6 08/23/2019  ? HGBA1C 5.7 (H) 05/03/2019  ? ?Lab Results  ?Component Value Date  ? INSULIN 14.7 05/13/2021  ? INSULIN 23.3 03/06/2020  ? INSULIN 12.7 08/23/2019  ? INSULIN 16.2 05/03/2019  ? ?Lab Results  ?Component Value Date  ? TSH 1.41 12/27/2020  ? ?Lab Results  ?Component Value Date  ? CHOL 230 (H) 05/13/2021  ? HDL 39 (L) 05/13/2021  ? LDLCALC 149 (H) 05/13/2021  ? LDLDIRECT 170.0 12/27/2020  ? TRIG 232 (H) 05/13/2021  ? CHOLHDL 6 12/27/2020  ? ?Lab Results  ?Component Value Date  ? VD25OH 61.3 05/13/2021  ? VD25OH 50.7 02/08/2020  ? VD25OH 63.1 08/23/2019  ? ?Lab Results  ?Component Value Date  ? WBC 6.2 12/27/2020  ? HGB 12.6 12/27/2020  ? HCT 38.0 12/27/2020  ? MCV 86.8 12/27/2020  ? PLT 394.0 12/27/2020  ? ?Lab Results  ?Component Value Date  ? IRON 62 01/05/2019  ? FERRITIN 57.7 01/05/2019  ? ?Attestation Statements:  ? ?Reviewed by clinician on day of visit: allergies, medications, problem list, medical history, surgical history, family history, social history, and previous encounter notes. ? ? ?I, Lizbeth Bark, RMA, am acting as transcriptionist for [employer], DO. ? ?I have reviewed the above documentation for accuracy and completeness, and I agree with the above. Jearld Lesch, DO ? ?

## 2021-06-30 ENCOUNTER — Telehealth: Payer: 59 | Admitting: Family Medicine

## 2021-06-30 ENCOUNTER — Encounter: Payer: Self-pay | Admitting: Family Medicine

## 2021-06-30 DIAGNOSIS — R062 Wheezing: Secondary | ICD-10-CM | POA: Diagnosis not present

## 2021-06-30 DIAGNOSIS — J069 Acute upper respiratory infection, unspecified: Secondary | ICD-10-CM

## 2021-06-30 DIAGNOSIS — R0982 Postnasal drip: Secondary | ICD-10-CM

## 2021-06-30 MED ORDER — ALBUTEROL SULFATE HFA 108 (90 BASE) MCG/ACT IN AERS: 2.0000 | INHALATION_SPRAY | Freq: Four times a day (QID) | RESPIRATORY_TRACT | 0 refills | Status: DC | PRN

## 2021-06-30 MED ORDER — BENZONATATE 100 MG PO CAPS
100.0000 mg | ORAL_CAPSULE | Freq: Two times a day (BID) | ORAL | 0 refills | Status: DC | PRN
Start: 2021-06-30 — End: 2021-07-14

## 2021-06-30 NOTE — Progress Notes (Signed)
Virtual Visit via Video Note ? ?I connected with Darlene Mccormick on 06/30/21 at 11:00 AM EDT by a video enabled telemedicine application 2/2 HERDE-08 pandemic and verified that I am speaking with the correct person using two identifiers. ? Location patient: home ?Location provider:work or home office ?Persons participating in the virtual visit: patient, provider ? ?I discussed the limitations of evaluation and management by telemedicine and the availability of in person appointments. The patient expressed understanding and agreed to proceed. ? ?Chief Complaint  ?Patient presents with  ? Nasal Congestion  ?  Yucky cough, wheezing when lying down, yellow congestion, sometimes clear. Feels like the cough is deep down, feels like bronchitis. Will be traveling to [place] on friday  ? ?HPI: ? ?Pt is a 46 yo female with pmh sig for anxiety, depression, fatty liver, HLD, HTN, obesity, PreDM, h/o fibroids s/p myomectomy followed by Dr. Ethlyn Gallery and seen for acute concern.  Pt with congestion, productive cough, wheezing, fatigue, x 3 days.  Pt states symptoms started over the wknd while at an conference and have progressed.  Pt also endorses subjective fever, chills, rhinorrhea, and post nasal drainage that makes throat sore/irritated.  Denies n/v, decreased appetite, ear pain/pressure, HA.  Pt  exposed to her mom who was sick last wk with bronchitis, who got an abx.  Pt tried nyquil, dayquil, claritin, mucinex.  Pt took a COVID test Friday am that was negative.  Pt travelling to [place] on Friday. ? ?ROS: See pertinent positives and negatives per HPI. ? ?Past Medical History:  ?Diagnosis Date  ? Anxiety   ? B12 deficiency   ? Depression   ? Elevated liver enzymes   ? Fatty liver   ? Gallstones   ? High triglycerides   ? Hyperlipidemia   ? Hypertension   ? not recently  ? Joint pain   ? Obesity   ? Pre-diabetes   ? Uterine fibroid   ? Vitamin D deficiency   ? ? ?Past Surgical History:  ?Procedure Laterality Date  ? APPENDECTOMY   1997  ? MYOMECTOMY N/A 02/16/2018  ? Procedure: ABDOMINAL MYOMECTOMY;  Surgeon: Thurnell Lose, MD;  Location: [facility];  Service: Gynecology;  Laterality: N/A;  ? TONSILLECTOMY AND ADENOIDECTOMY  1988  ? TRACHEOSTOMY  1979  ? done during epiglotitis  ? UTERINE FIBROID SURGERY    ? ? ?Family History  ?Problem Relation Age of Onset  ? Diabetes Mother   ? Other Mother   ?     traumatic brain injury  ? High Cholesterol Mother   ? Obesity Mother   ? Hyperlipidemia Father   ? Hypertension Father   ? Anxiety disorder Father   ? Sleep apnea Father   ? Obesity Father   ? Breast cancer Maternal Grandmother   ? Alcohol abuse Maternal Grandfather   ? High blood pressure Brother   ? Breast cancer Other   ? Heart attack Paternal Grandmother 69  ? Colon cancer Neg Hx   ? Stomach cancer Neg Hx   ? Esophageal cancer Neg Hx   ? ? ? ?Current Outpatient Medications:  ?  Magnesium 500 MG CAPS, Take 500 mg by mouth daily., Disp: , Rfl:  ?  Multiple Vitamin (MULTIVITAMIN WITH MINERALS) TABS tablet, Take 1 tablet by mouth daily., Disp: , Rfl:  ?  nystatin-triamcinolone ointment (MYCOLOG), Apply 1 application. topically 2 (two) times daily., Disp: 30 g, Rfl: 0 ?  phentermine 15 MG capsule, Take 1 capsule (15 mg total) by mouth every  morning., Disp: 30 capsule, Rfl: 0 ?  topiramate (TOPAMAX) 50 MG tablet, Take 1 tablet (50 mg total) by mouth daily with supper., Disp: 30 tablet, Rfl: 0 ?  terbinafine (LAMISIL) 250 MG tablet, Take 1 tablet (250 mg total) by mouth daily. (Patient not taking: Reported on 06/30/2021), Disp: 30 tablet, Rfl: 0 ? ?EXAM: ? ?VITALS per patient if applicable: RR between 42-35 bpm ? ?GENERAL: alert, oriented, appears well and in no acute distress ? ?HEENT: atraumatic, conjunctiva clear, no obvious abnormalities on inspection of external nose and ears ? ?NECK: normal movements of the head and neck ? ?LUNGS: Productive cough, on inspection no signs of respiratory distress, breathing rate appears normal, no obvious gross  SOB, gasping or wheezing ? ?CV: no obvious cyanosis ? ?MS: moves all visible extremities without noticeable abnormality ? ?PSYCH/NEURO: pleasant and cooperative, no obvious depression or anxiety, speech and thought processing grossly intact ? ?ASSESSMENT AND PLAN: ? ?Discussed the following assessment and plan: ? ?Viral URI with cough  ?-Symptoms likely 2/2 viral etiology.  Discussed possible causes including nasopharyngitis, influenza, COVID. ?-It is in my opinion that antibiotic not currently indicated. ?-Encouraged to repeat at home COVID testing.  Notify clinic for positive result. ?-Continue treatment of symptoms with OTC medications, Tylenol or NSAIDs as needed for pain/discomfort, gargling with warm salt water or Chloraseptic spray, rest, hydration, etc. ?-Encouraged to use Flonase nasal spray or saline nasal rinse ?-Tessalon for cough ?-Given strict precautions ?- Plan: benzonatate (TESSALON) 100 MG capsule ? ?Post-nasal drainage ?-OTC antihistamine as needed, Flonase, saline nasal rinse ? ?Wheezing  ?- Plan: albuterol (VENTOLIN HFA) 108 (90 Base) MCG/ACT inhaler ? ?Follow-up as needed in person for worsening symptoms ?  ?I discussed the assessment and treatment plan with the patient. The patient was provided an opportunity to ask questions and all were answered. The patient agreed with the plan and demonstrated an understanding of the instructions. ?  ?The patient was advised to call back or seek an in-person evaluation if the symptoms worsen or if the condition fails to improve as anticipated. ? ?Billie Ruddy, MD  ? ?

## 2021-07-14 ENCOUNTER — Encounter: Payer: Self-pay | Admitting: Family Medicine

## 2021-07-14 ENCOUNTER — Ambulatory Visit: Payer: 59 | Admitting: Family Medicine

## 2021-07-14 VITALS — BP 118/82 | HR 80 | Temp 97.9°F | Ht 68.0 in | Wt 284.2 lb

## 2021-07-14 DIAGNOSIS — Z6841 Body Mass Index (BMI) 40.0 and over, adult: Secondary | ICD-10-CM | POA: Diagnosis not present

## 2021-07-14 DIAGNOSIS — Z23 Encounter for immunization: Secondary | ICD-10-CM | POA: Diagnosis not present

## 2021-07-14 MED ORDER — WEGOVY 0.25 MG/0.5ML ~~LOC~~ SOAJ: 0.2500 mg | SUBCUTANEOUS | 5 refills | Status: DC

## 2021-07-14 NOTE — Progress Notes (Signed)
?Darlene Mccormick ?DOB: 19-Mar-1976 ?Encounter date: 07/14/2021 ? ?This is a 46 y.o. female who presents with ?Chief Complaint  ?Patient presents with  ? Follow-up  ? ? ?History of present illness: ?Patient had a virtual visit on 4/3 with cough, wheezing. Ended up going to urgent care when not better 2 days later and got better on antibiotics.  ? ?She is following with healthy weight and wellness, Dr. Owens Mccormick.  She was given topiramate and phentermine at her last visit.  We had tried to prescribe GLP-1, but insurance would not cover this. ? ?Just got back from [place]- spring break with husband. Great trip. ? ?Hadn't started the phentermine or topamax yet. Wanted to discuss this and also didn't want to start new med right before her trip. ? ? ?No Known Allergies ?Current Meds  ?Medication Sig  ? Magnesium 500 MG CAPS Take 500 mg by mouth daily.  ? Multiple Vitamin (MULTIVITAMIN WITH MINERALS) TABS tablet Take 1 tablet by mouth daily.  ? nystatin-triamcinolone ointment (MYCOLOG) Apply 1 application. topically 2 (two) times daily.  ? phentermine 15 MG capsule Take 1 capsule (15 mg total) by mouth every morning.  ? Semaglutide-Weight Management (WEGOVY) 0.25 MG/0.5ML SOAJ Inject 0.25 mg into the skin once a week.  ? terbinafine (LAMISIL) 250 MG tablet Take 1 tablet (250 mg total) by mouth daily.  ? topiramate (TOPAMAX) 50 MG tablet Take 1 tablet (50 mg total) by mouth daily with supper.  ? [DISCONTINUED] albuterol (VENTOLIN HFA) 108 (90 Base) MCG/ACT inhaler Inhale 2 puffs into the lungs every 6 (six) hours as needed for wheezing or shortness of breath.  ? [DISCONTINUED] benzonatate (TESSALON) 100 MG capsule Take 1 capsule (100 mg total) by mouth 2 (two) times daily as needed for cough.  ? ? ?Review of Systems  ?Constitutional:  Negative for chills, fatigue and fever.  ?Respiratory:  Negative for cough, chest tightness, shortness of breath and wheezing.   ?Cardiovascular:  Negative for chest pain, palpitations and  leg swelling.  ? ?Objective: ? ?BP 118/82 (BP Location: Left Arm, Patient Position: Sitting, Cuff Size: Large)   Pulse 80   Temp 97.9 ?F (36.6 ?C) (Oral)   Ht '5\' 8"'$  (1.727 m)   Wt 284 lb 3.2 oz (128.9 kg)   SpO2 98%   BMI 43.21 kg/m?   Weight: 284 lb 3.2 oz (128.9 kg)  ? ?BP Readings from Last 3 Encounters:  ?07/14/21 118/82  ?06/19/21 139/87  ?05/27/21 130/82  ? ?Wt Readings from Last 3 Encounters:  ?07/14/21 284 lb 3.2 oz (128.9 kg)  ?06/19/21 281 lb (127.5 kg)  ?05/27/21 279 lb (126.6 kg)  ? ? ?Physical Exam ?Constitutional:   ?   General: She is not in acute distress. ?   Appearance: She is well-developed. She is obese.  ?Cardiovascular:  ?   Rate and Rhythm: Normal rate and regular rhythm.  ?   Heart sounds: Normal heart sounds. No murmur heard. ?  No friction rub.  ?Pulmonary:  ?   Effort: Pulmonary effort is normal. No respiratory distress.  ?   Breath sounds: Normal breath sounds. No wheezing or rales.  ?Musculoskeletal:  ?   Right lower leg: No edema.  ?   Left lower leg: No edema.  ?Neurological:  ?   Mental Status: She is alert and oriented to person, place, and time.  ?Psychiatric:     ?   Behavior: Behavior normal.  ? ? ?Assessment/Plan ? ?1. Need for hepatitis B vaccination ?Second dose  given today. ?- Heplisav-B (HepB-CPG) Vaccine ? ?2. Class 3 severe obesity with serious comorbidity and body mass index (BMI) of 40.0 to 44.9 in adult, unspecified obesity type ([place]) ?Discussed prescribed medications including potential side effects of these. Her insurance is restrictive about what they will cover. Advised staggering start of meds so she can monitor for side effects.she does have hx of palpitations, so we will monitor for this.  We discussed that the phentermine dose she was given is a lower dose, so hopefully it will not be as difficult for her to tolerate.  She will continue to follow with healthy weight and wellness. ? ? ?Return if symptoms worsen or fail to improve. ? ? ?Can get hep A and B at  next visit after 01/13/22. ? ? ?Micheline Rough, MD ?

## 2021-07-17 ENCOUNTER — Ambulatory Visit: Payer: 59 | Admitting: Sports Medicine

## 2021-07-19 ENCOUNTER — Other Ambulatory Visit (INDEPENDENT_AMBULATORY_CARE_PROVIDER_SITE_OTHER): Payer: Self-pay | Admitting: Bariatrics

## 2021-07-21 ENCOUNTER — Ambulatory Visit (INDEPENDENT_AMBULATORY_CARE_PROVIDER_SITE_OTHER): Payer: 59 | Admitting: Bariatrics

## 2021-07-21 ENCOUNTER — Encounter (INDEPENDENT_AMBULATORY_CARE_PROVIDER_SITE_OTHER): Payer: Self-pay | Admitting: Bariatrics

## 2021-07-21 VITALS — BP 121/89 | HR 86 | Temp 97.7°F | Ht 68.0 in | Wt 282.0 lb

## 2021-07-21 DIAGNOSIS — E782 Mixed hyperlipidemia: Secondary | ICD-10-CM

## 2021-07-21 DIAGNOSIS — Z6841 Body Mass Index (BMI) 40.0 and over, adult: Secondary | ICD-10-CM | POA: Diagnosis not present

## 2021-07-21 DIAGNOSIS — R7303 Prediabetes: Secondary | ICD-10-CM | POA: Diagnosis not present

## 2021-07-28 NOTE — Progress Notes (Signed)
? ? ? ?Chief Complaint:  ? ?OBESITY ?Darlene Mccormick is here to discuss her progress with her obesity treatment plan along with follow-up of her obesity related diagnoses. Darlene Mccormick is on keeping a food journal and adhering to recommended goals of 1,800 calories and 90 protein and states she is following her eating plan approximately 25% of the time. Darlene Mccormick states she is walking 60 minutes 5 times per week. ? ?Today's visit was #: 4 ?Starting weight: 282 lbs ?Starting date: 05/13/2021 ?Today's weight: 282 lbs ?Today's date: 07/21/2021 ?Total lbs lost to date: 0 lbs ?Total lbs lost since last in-office visit: 0 lbs ? ?Interim History: Darlene Mccormick is up 1 lb since her last visit.  She has not started her Phentermine and Topamax, since she was out of town. ? ?Subjective:  ? ?1. Prediabetes ?Darlene Mccormick has a diagnosis of prediabetes based on her elevated HgA1c and was informed this puts her at greater risk of developing diabetes. She continues to work on diet and exercise to decrease her risk of diabetes. She denies nausea or hypoglycemia.  Darlene Mccormick reports that wegovy was not approved. ? ?2. Hyperlipidemia, mixed ?Darlene Mccormick is not currently taking any medications. Darlene Mccormick has hyperlipidemia and has been trying to improve her cholesterol levels with intensive lifestyle modifications including a low saturated fat diet, exercise, and weight loss. She denies any chest pain, claudication, or myalgias. ? ?Assessment/Plan:  ? ?1. Prediabetes ?Darlene Mccormick will continue to work on weight loss, exercise, and decreasing simple carbohydrates to help decrease the risk of diabetes.  ?Darlene Mccormick will decrease her carbohydrates/sweets & starches. ? ?2. Hyperlipidemia, mixed ?Darlene Mccormick will adhere closely to the plan and exercises. Cardiovascular risk and specific lipid/LDL goals reviewed.  We discussed several lifestyle modifications today and Darlene Mccormick will continue to work on diet, exercise and weight loss efforts. Orders and follow up as documented in patient record.  ? ?Counseling ?Intensive  lifestyle modifications are the first line treatment for this issue. ?Dietary changes: Increase soluble fiber. Decrease simple carbohydrates. ?Exercise changes: Moderate to vigorous-intensity aerobic activity 150 minutes per week if tolerated. ?Lipid-lowering medications: see documented in medical record. ? ?Darlene Mccormick is currently in the action stage of change. As such, her goal is to continue with weight loss efforts. She has agreed to keeping a food journal and adhering to recommended goals of 1,800 calories and 80-110 protein.  ? ?Darlene Mccormick will start meal planning and intentional eating. She has not started the Phentermine and Topamax that was prescribed as she has been out of the country and wanted to wait until she got back. . ? ?Exercise goals: Darlene Mccormick will swim 2 days a week and walk her dog. ? ?Behavioral modification strategies: increasing lean protein intake, decreasing simple carbohydrates, increasing vegetables, increasing water intake, decreasing eating out, no skipping meals, meal planning and cooking strategies, keeping healthy foods in the home, and planning for success. ? ?Darlene Mccormick has agreed to follow-up with our clinic in 3-4 weeks. She was informed of the importance of frequent follow-up visits to maximize her success with intensive lifestyle modifications for her multiple health conditions.  ? ?Objective:  ? ?Blood pressure 121/89, pulse 86, temperature 97.7 ?F (36.5 ?C), height '5\' 8"'$  (1.727 m), weight 282 lb (127.9 kg), SpO2 97 %. ?Body mass index is 42.88 kg/m?. ? ?General: Cooperative, alert, well developed, in no acute distress. ?HEENT: Conjunctivae and lids unremarkable. ?Cardiovascular: Regular rhythm.  ?Lungs: Normal work of breathing. ?Neurologic: No focal deficits.  ? ?Lab Results  ?Component Value Date  ? CREATININE 0.59 05/13/2021  ?  BUN 9 05/13/2021  ? NA 139 05/13/2021  ? K 4.7 05/13/2021  ? CL 102 05/13/2021  ? CO2 23 05/13/2021  ? ?Lab Results  ?Component Value Date  ? ALT 40 (H) 05/13/2021  ?  AST 23 05/13/2021  ? ALKPHOS 72 05/13/2021  ? BILITOT 0.6 05/13/2021  ? ?Lab Results  ?Component Value Date  ? HGBA1C 5.9 05/02/2021  ? HGBA1C 6.0 12/27/2020  ? HGBA1C 6.0 (H) 02/08/2020  ? HGBA1C 5.6 08/23/2019  ? HGBA1C 5.7 (H) 05/03/2019  ? ?Lab Results  ?Component Value Date  ? INSULIN 14.7 05/13/2021  ? INSULIN 23.3 03/06/2020  ? INSULIN 12.7 08/23/2019  ? INSULIN 16.2 05/03/2019  ? ?Lab Results  ?Component Value Date  ? TSH 1.41 12/27/2020  ? ?Lab Results  ?Component Value Date  ? CHOL 230 (H) 05/13/2021  ? HDL 39 (L) 05/13/2021  ? LDLCALC 149 (H) 05/13/2021  ? LDLDIRECT 170.0 12/27/2020  ? TRIG 232 (H) 05/13/2021  ? CHOLHDL 6 12/27/2020  ? ?Lab Results  ?Component Value Date  ? VD25OH 61.3 05/13/2021  ? VD25OH 50.7 02/08/2020  ? VD25OH 63.1 08/23/2019  ? ?Lab Results  ?Component Value Date  ? WBC 6.2 12/27/2020  ? HGB 12.6 12/27/2020  ? HCT 38.0 12/27/2020  ? MCV 86.8 12/27/2020  ? PLT 394.0 12/27/2020  ? ?Lab Results  ?Component Value Date  ? IRON 62 01/05/2019  ? FERRITIN 57.7 01/05/2019  ? ? ?Attestation Statements:  ? ?Reviewed by clinician on day of visit: allergies, medications, problem list, medical history, surgical history, family history, social history, and previous encounter notes. ? ?I, Lennette Bihari, CMA, am acting as transcriptionist for Dr. Jearld Lesch, DO.  ? ?I have reviewed the above documentation for accuracy and completeness, and I agree with the above. Jearld Lesch, DO ? ?

## 2021-07-29 ENCOUNTER — Encounter (INDEPENDENT_AMBULATORY_CARE_PROVIDER_SITE_OTHER): Payer: Self-pay | Admitting: Bariatrics

## 2021-08-14 ENCOUNTER — Ambulatory Visit: Payer: 59 | Admitting: Sports Medicine

## 2021-08-18 ENCOUNTER — Ambulatory Visit (INDEPENDENT_AMBULATORY_CARE_PROVIDER_SITE_OTHER): Payer: 59 | Admitting: Adult Health

## 2021-09-15 ENCOUNTER — Ambulatory Visit (INDEPENDENT_AMBULATORY_CARE_PROVIDER_SITE_OTHER): Payer: 59 | Admitting: Bariatrics

## 2021-09-15 ENCOUNTER — Encounter (INDEPENDENT_AMBULATORY_CARE_PROVIDER_SITE_OTHER): Payer: Self-pay | Admitting: Bariatrics

## 2021-09-15 VITALS — BP 128/85 | HR 82 | Temp 97.8°F | Ht 68.0 in | Wt 284.0 lb

## 2021-09-15 DIAGNOSIS — E782 Mixed hyperlipidemia: Secondary | ICD-10-CM

## 2021-09-15 DIAGNOSIS — E669 Obesity, unspecified: Secondary | ICD-10-CM

## 2021-09-15 DIAGNOSIS — E88819 Insulin resistance, unspecified: Secondary | ICD-10-CM

## 2021-09-15 DIAGNOSIS — E8881 Metabolic syndrome: Secondary | ICD-10-CM

## 2021-09-15 DIAGNOSIS — Z6841 Body Mass Index (BMI) 40.0 and over, adult: Secondary | ICD-10-CM

## 2021-09-16 NOTE — Progress Notes (Unsigned)
Chief Complaint:   OBESITY Darlene Mccormick is here to discuss her progress with her obesity treatment plan along with follow-up of her obesity related diagnoses. Darlene Mccormick is on keeping a food journal and adhering to recommended goals of 1800 calories and 80-110 grams of protein daily and states she is following her eating plan approximately 50% of the time. Darlene Mccormick states she is walking and swimming for 30 minutes 5 times per week.  Today's visit was #: 5 Starting weight: 282 lbs Starting date: 05/13/2021 Today's weight: 284 lbs Today's date: 09/15/2021 Total lbs lost to date: 0 Total lbs lost since last in-office visit: 0  Interim History: Darlene Mccormick is up 2 pounds since her last visit.  She is taking phentermine.  She had a conference since her last visit and has been to the beach.  Subjective:   1. Insulin resistance Darlene Mccormick is not on medications currently.  2. Hyperlipidemia, mixed Darlene Mccormick is not on medications currently.  Assessment/Plan:   1. Insulin resistance [name] will reduce carbohydrates, both starches and sweets.  2. Hyperlipidemia, mixed Darlene Mccormick will read labels and eliminate trans fats.  Minimal saturated fats except for dairy and unprocessed meats.  3. Obesity, Current BMI 43.2 Darlene Mccormick is currently in the action stage of change. As such, her goal is to continue with weight loss efforts. She has agreed to keeping a food journal and adhering to recommended goals of 1800 calories and 80-110 grams of protein daily.   We will recheck fasting labs at her next visit.  Darlene Mccormick will add her to the plan 80-90%.  Intentional eating.  Decrease carbohydrates, decrease sweets, increase water, and increase exercise.  Handout for smart fruit was given.  Exercise goals: As is.  Behavioral modification strategies: increasing lean protein intake, decreasing simple carbohydrates, increasing vegetables, increasing water intake, decreasing eating out, no skipping meals, meal planning and cooking strategies, keeping  healthy foods in the home, and planning for success.  Darlene Mccormick has agreed to follow-up with our clinic in 4 to 6 weeks. She was informed of the importance of frequent follow-up visits to maximize her success with intensive lifestyle modifications for her multiple health conditions.   Objective:   Blood pressure 128/85, pulse 82, temperature 97.8 F (36.6 C), height '5\' 8"'$  (1.727 m), weight 284 lb (128.8 kg), SpO2 96 %. Body mass index is 43.18 kg/m.  General: Cooperative, alert, well developed, in no acute distress. HEENT: Conjunctivae and lids unremarkable. Cardiovascular: Regular rhythm.  Lungs: Normal work of breathing. Neurologic: No focal deficits.   Lab Results  Component Value Date   CREATININE 0.59 05/13/2021   BUN 9 05/13/2021   NA 139 05/13/2021   K 4.7 05/13/2021   CL 102 05/13/2021   CO2 23 05/13/2021   Lab Results  Component Value Date   ALT 40 (H) 05/13/2021   AST 23 05/13/2021   ALKPHOS 72 05/13/2021   BILITOT 0.6 05/13/2021   Lab Results  Component Value Date   HGBA1C 5.9 05/02/2021   HGBA1C 6.0 12/27/2020   HGBA1C 6.0 (H) 02/08/2020   HGBA1C 5.6 08/23/2019   HGBA1C 5.7 (H) 05/03/2019   Lab Results  Component Value Date   INSULIN 14.7 05/13/2021   INSULIN 23.3 03/06/2020   INSULIN 12.7 08/23/2019   INSULIN 16.2 05/03/2019   Lab Results  Component Value Date   TSH 1.41 12/27/2020   Lab Results  Component Value Date   CHOL 230 (H) 05/13/2021   HDL 39 (L) 05/13/2021   LDLCALC 149 (H)  05/13/2021   LDLDIRECT 170.0 12/27/2020   TRIG 232 (H) 05/13/2021   CHOLHDL 6 12/27/2020   Lab Results  Component Value Date   VD25OH 61.3 05/13/2021   VD25OH 50.7 02/08/2020   VD25OH 63.1 08/23/2019   Lab Results  Component Value Date   WBC 6.2 12/27/2020   HGB 12.6 12/27/2020   HCT 38.0 12/27/2020   MCV 86.8 12/27/2020   PLT 394.0 12/27/2020   Lab Results  Component Value Date   IRON 62 01/05/2019   FERRITIN 57.7 01/05/2019   Attestation  Statements:   Reviewed by clinician on day of visit: allergies, medications, problem list, medical history, surgical history, family history, social history, and previous encounter notes.   Wilhemena Durie, am acting as Location manager for [employer], DO.  I have reviewed the above documentation for accuracy and completeness, and I agree with the above. -  ***

## 2021-09-17 ENCOUNTER — Ambulatory Visit: Payer: 59 | Admitting: Podiatry

## 2021-09-20 ENCOUNTER — Encounter (INDEPENDENT_AMBULATORY_CARE_PROVIDER_SITE_OTHER): Payer: Self-pay | Admitting: Bariatrics

## 2021-09-26 ENCOUNTER — Telehealth: Payer: Self-pay | Admitting: Family Medicine

## 2021-09-26 ENCOUNTER — Other Ambulatory Visit: Payer: Self-pay | Admitting: Family

## 2021-09-26 MED ORDER — CIPROFLOXACIN HCL 500 MG PO TABS: 500.0000 mg | ORAL_TABLET | Freq: Two times a day (BID) | ORAL | 0 refills | Status: AC

## 2021-09-26 NOTE — Telephone Encounter (Signed)
Left a detailed message at the patient's cell number with the information below.   

## 2021-09-26 NOTE — Telephone Encounter (Signed)
Pt is traveling to [place] next week and was told to ask PCP for Ciprofloxacin or something similar to combat possible traveler's diarrhea - Pt states it would be preventative, in case she was affected.  Please advise.  [id number] [address], [place] [address] Phone:  [phone]  Fax:  [phone]

## 2021-11-03 ENCOUNTER — Encounter (INDEPENDENT_AMBULATORY_CARE_PROVIDER_SITE_OTHER): Payer: Self-pay | Admitting: Bariatrics

## 2021-11-03 ENCOUNTER — Ambulatory Visit (INDEPENDENT_AMBULATORY_CARE_PROVIDER_SITE_OTHER): Payer: 59 | Admitting: Bariatrics

## 2021-11-03 VITALS — BP 134/87 | HR 71 | Temp 97.8°F | Ht 68.0 in | Wt 280.0 lb

## 2021-11-03 DIAGNOSIS — E782 Mixed hyperlipidemia: Secondary | ICD-10-CM | POA: Diagnosis not present

## 2021-11-03 DIAGNOSIS — Z6841 Body Mass Index (BMI) 40.0 and over, adult: Secondary | ICD-10-CM

## 2021-11-03 DIAGNOSIS — E559 Vitamin D deficiency, unspecified: Secondary | ICD-10-CM | POA: Diagnosis not present

## 2021-11-03 DIAGNOSIS — E669 Obesity, unspecified: Secondary | ICD-10-CM

## 2021-11-03 DIAGNOSIS — R7303 Prediabetes: Secondary | ICD-10-CM | POA: Diagnosis not present

## 2021-11-04 LAB — COMPREHENSIVE METABOLIC PANEL
ALT: 51 IU/L — ABNORMAL HIGH (ref 0–32)
AST: 30 IU/L (ref 0–40)
Albumin/Globulin Ratio: 1.8 (ref 1.2–2.2)
Albumin: 4.2 g/dL (ref 3.9–4.9)
Alkaline Phosphatase: 71 IU/L (ref 44–121)
BUN/Creatinine Ratio: 9 (ref 9–23)
BUN: 6 mg/dL (ref 6–24)
Bilirubin Total: 0.7 mg/dL (ref 0.0–1.2)
CO2: 20 mmol/L (ref 20–29)
Calcium: 8.7 mg/dL (ref 8.7–10.2)
Chloride: 102 mmol/L (ref 96–106)
Creatinine, Ser: 0.69 mg/dL (ref 0.57–1.00)
Globulin, Total: 2.4 g/dL (ref 1.5–4.5)
Glucose: 107 mg/dL — ABNORMAL HIGH (ref 70–99)
Potassium: 4.6 mmol/L (ref 3.5–5.2)
Sodium: 137 mmol/L (ref 134–144)
Total Protein: 6.6 g/dL (ref 6.0–8.5)
eGFR: 110 mL/min/{1.73_m2} (ref >59)

## 2021-11-04 LAB — LIPID PANEL WITH LDL/HDL RATIO
Cholesterol, Total: 227 mg/dL — ABNORMAL HIGH (ref 100–199)
HDL: 37 mg/dL — ABNORMAL LOW (ref >39)
LDL Chol Calc (NIH): 154 mg/dL — ABNORMAL HIGH (ref 0–99)
LDL/HDL Ratio: 4.2 ratio — ABNORMAL HIGH (ref 0.0–3.2)
Triglycerides: 196 mg/dL — ABNORMAL HIGH (ref 0–149)
VLDL Cholesterol Cal: 36 mg/dL (ref 5–40)

## 2021-11-04 LAB — HEMOGLOBIN A1C
Est. average glucose Bld gHb Est-mCnc: 128 mg/dL
Hgb A1c MFr Bld: 6.1 % — ABNORMAL HIGH (ref 4.8–5.6)

## 2021-11-04 LAB — VITAMIN D 25 HYDROXY (VIT D DEFICIENCY, FRACTURES): Vit D, 25-Hydroxy: 46 ng/mL (ref 30.0–100.0)

## 2021-11-04 LAB — INSULIN, RANDOM: INSULIN: 23.8 u[IU]/mL (ref 2.6–24.9)

## 2021-11-05 ENCOUNTER — Encounter (INDEPENDENT_AMBULATORY_CARE_PROVIDER_SITE_OTHER): Payer: Self-pay

## 2021-11-10 NOTE — Progress Notes (Unsigned)
Chief Complaint:   OBESITY Darlene Mccormick is here to discuss her progress with her obesity treatment plan along with follow-up of her obesity related diagnoses. [name] is on keeping a food journal and adhering to recommended goals of 1800 calories and 80-110 grams of protein daily and states she is following her eating plan approximately 25% of the time. [name] states she is walking 10,000-12,000 steps 7 times per week.    Today's visit was #: 6 Starting weight: 282 lbs Starting date: 05/13/2021 Today's weight: 280 lbs Today's date: 11/03/2021 Total lbs lost to date: 2 Total lbs lost since last in-office visit: 4  Interim History: Darlene Mccormick is down 4 lbs since her last visit. She is doing ok with her water and protein intake.   Subjective:   1. Vitamin D deficiency Darlene Mccormick is currently taking Vitamin D.  2. Prediabetes [name] is not on medications currently.   3. Hyperlipidemia, mixed Darlene Mccormick is note on medications currently.   Assessment/Plan:   1. Vitamin D deficiency We will check labs today, and Darlene Mccormick will follow-up for routine testing of Vitamin D, at least 2-3 times per year to avoid over-replacement.  - VITAMIN D 25 Hydroxy (Vit-D Deficiency, Fractures)  2. Prediabetes We will check labs today, and we will follow-up at Trinetta's next visit.  - Comprehensive metabolic panel - Insulin, random - Hemoglobin A1c  3. Hyperlipidemia, mixed We will check labs today, and we will follow-up at Darlene Mccormick's next visit.  - Comprehensive metabolic panel - Lipid Panel With LDL/HDL Ratio  4. Obesity, current BMI 42.7 Darlene Mccormick is currently in the action stage of change. As such, her goal is to continue with weight loss efforts. She has agreed to keeping a food journal and adhering to recommended goals of 1600 calories and 90 grams of protein daily.   Meal planning was discussed.   Exercise goals: As is, and will go back to swimming.  Behavioral modification strategies: increasing lean protein intake,  decreasing simple carbohydrates, increasing vegetables, increasing water intake, decreasing eating out, no skipping meals, meal planning and cooking strategies, keeping healthy foods in the home, and planning for success.  [name] has agreed to follow-up with our clinic in 2 to 3 weeks. She was informed of the importance of frequent follow-up visits to maximize her success with intensive lifestyle modifications for her multiple health conditions.   Darlene Mccormick was informed we would discuss her lab results at her next visit unless there is a critical issue that needs to be addressed sooner. Darlene Mccormick agreed to keep her next visit at the agreed upon time to discuss these results.  Objective:   Blood pressure 134/87, pulse 71, temperature 97.8 F (36.6 C), height 5' 8"  (1.727 m), weight 280 lb (127 kg), SpO2 98 %. Body mass index is 42.57 kg/m.  General: Cooperative, alert, well developed, in no acute distress. HEENT: Conjunctivae and lids unremarkable. Cardiovascular: Regular rhythm.  Lungs: Normal work of breathing. Neurologic: No focal deficits.   Lab Results  Component Value Date   CREATININE 0.69 11/03/2021   BUN 6 11/03/2021   NA 137 11/03/2021   K 4.6 11/03/2021   CL 102 11/03/2021   CO2 20 11/03/2021   Lab Results  Component Value Date   ALT 51 (H) 11/03/2021   AST 30 11/03/2021   ALKPHOS 71 11/03/2021   BILITOT 0.7 11/03/2021   Lab Results  Component Value Date   HGBA1C 6.1 (H) 11/03/2021   HGBA1C 5.9 05/02/2021   HGBA1C 6.0 12/27/2020  HGBA1C 6.0 (H) 02/08/2020   HGBA1C 5.6 08/23/2019   Lab Results  Component Value Date   INSULIN 23.8 11/03/2021   INSULIN 14.7 05/13/2021   INSULIN 23.3 03/06/2020   INSULIN 12.7 08/23/2019   INSULIN 16.2 05/03/2019   Lab Results  Component Value Date   TSH 1.41 12/27/2020   Lab Results  Component Value Date   CHOL 227 (H) 11/03/2021   HDL 37 (L) 11/03/2021   LDLCALC 154 (H) 11/03/2021   LDLDIRECT 170.0 12/27/2020   TRIG 196 (H)  11/03/2021   CHOLHDL 6 12/27/2020   Lab Results  Component Value Date   VD25OH 46.0 11/03/2021   VD25OH 61.3 05/13/2021   VD25OH 50.7 02/08/2020   Lab Results  Component Value Date   WBC 6.2 12/27/2020   HGB 12.6 12/27/2020   HCT 38.0 12/27/2020   MCV 86.8 12/27/2020   PLT 394.0 12/27/2020   Lab Results  Component Value Date   IRON 62 01/05/2019   FERRITIN 57.7 01/05/2019   Attestation Statements:   Reviewed by clinician on day of visit: allergies, medications, problem list, medical history, surgical history, family history, social history, and previous encounter notes.   Wilhemena Durie, am acting as Location manager for [employer], DO.  I have reviewed the above documentation for accuracy and completeness, and I agree with the above. Jearld Lesch, DO

## 2021-11-11 ENCOUNTER — Encounter (INDEPENDENT_AMBULATORY_CARE_PROVIDER_SITE_OTHER): Payer: Self-pay | Admitting: Bariatrics

## 2021-11-26 ENCOUNTER — Encounter (INDEPENDENT_AMBULATORY_CARE_PROVIDER_SITE_OTHER): Payer: Self-pay | Admitting: Bariatrics

## 2021-12-03 ENCOUNTER — Ambulatory Visit (INDEPENDENT_AMBULATORY_CARE_PROVIDER_SITE_OTHER): Payer: 59 | Admitting: Bariatrics

## 2021-12-29 ENCOUNTER — Encounter (INDEPENDENT_AMBULATORY_CARE_PROVIDER_SITE_OTHER): Payer: Self-pay | Admitting: Family Medicine

## 2021-12-29 ENCOUNTER — Ambulatory Visit (INDEPENDENT_AMBULATORY_CARE_PROVIDER_SITE_OTHER): Payer: 59 | Admitting: Family Medicine

## 2021-12-29 VITALS — BP 141/85 | HR 79 | Temp 97.8°F | Ht 68.0 in | Wt 281.0 lb

## 2021-12-29 DIAGNOSIS — R7303 Prediabetes: Secondary | ICD-10-CM | POA: Diagnosis not present

## 2021-12-29 DIAGNOSIS — K760 Fatty (change of) liver, not elsewhere classified: Secondary | ICD-10-CM | POA: Diagnosis not present

## 2021-12-29 DIAGNOSIS — E669 Obesity, unspecified: Secondary | ICD-10-CM

## 2021-12-29 DIAGNOSIS — Z6841 Body Mass Index (BMI) 40.0 and over, adult: Secondary | ICD-10-CM

## 2021-12-29 DIAGNOSIS — E782 Mixed hyperlipidemia: Secondary | ICD-10-CM

## 2021-12-30 DIAGNOSIS — E782 Mixed hyperlipidemia: Secondary | ICD-10-CM | POA: Insufficient documentation

## 2021-12-30 MED ORDER — TOPIRAMATE 25 MG PO TABS: 25.0000 mg | ORAL_TABLET | Freq: Every day | ORAL | 0 refills | Status: DC

## 2021-12-30 MED ORDER — LOMAIRA 8 MG PO TABS: ORAL_TABLET | ORAL | 0 refills | Status: DC

## 2022-01-01 NOTE — Progress Notes (Signed)
Chief Complaint:   OBESITY Darlene Mccormick is here to discuss her progress with her obesity treatment plan along with follow-up of her obesity related diagnoses. Darlene Mccormick is on keeping a food journal and adhering to recommended goals of 1600 calories and 90 grams protein daily and states she is following her eating plan approximately 50% of the time. Darlene Mccormick states she is walking for 30 minutes 2 times per week.  Today's visit was #: 7 Starting weight: 282 lbs Starting date: 05/13/2021 Today's weight: 281 lbs Today's date: 12/29/21 Total lbs lost to date: 1 Total lbs lost since last in-office visit: +1  Interim History: She has been traveling the past 2 weeks for work.  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  She is working on portion control.  She denies hunger or cravings.  She is frustrated by lack of progress-net weight loss of 1 pound in 8 months.  Declined weight loss surgery.  Subjective:   1. Mixed hyperlipidemia Reviewed fasting lipid profile from 11/03/2021. Total cholesterol 227, LDL 154, HDL 37, triglycerides 196. 1.  Not on any medications for lipids. 2.  Family history of hyperlipidemia.  2. Prediabetes Last A1c 6.1. Still consuming excess sugar and starch.  3. NAFLD (nonalcoholic fatty liver disease) Denies abdominal pain or bloating.  Assessment/Plan:   1. Mixed hyperlipidemia Follow-up with PCP-she would like to avoid statins.  2. Prediabetes Look for A1c improvements with dietary changes.  3. NAFLD (nonalcoholic fatty liver disease) Continue active plan for weight loss.  4. Obesity, current BMI 42.8 1.  Discussed adding antiobesity medications-risk versus benefit. 2.  Begin: - Phentermine HCl (LOMAIRA) 8 MG TABS; 1 tab po 30 min before breakfast daily  Dispense: 30 tablet; Refill: 0 - topiramate (TOPAMAX) 25 MG tablet; Take 1 tablet (25 mg total) by mouth daily.  Dispense: 30 tablet; Refill: 0  Darlene Mccormick is currently in the action stage of change. As such, her goal is to continue  with weight loss efforts. She has agreed to  keeping a food journal and adhering to recommended goals of 1600 calories and 90 grams protein daily.   Exercise goals: Increase walking to 3 times per week.  Behavioral modification strategies: increasing lean protein intake, decreasing simple carbohydrates, increasing vegetables, decreasing eating out, no skipping meals, meal planning and cooking strategies, better snacking choices, planning for success, and decreasing junk food.  Darlene Mccormick has agreed to follow-up with our clinic in 3 weeks. She was informed of the importance of frequent follow-up visits to maximize her success with intensive lifestyle modifications for her multiple health conditions.   Objective:   Blood pressure (!) 141/85, pulse 79, temperature 97.8 F (36.6 C), height 5' 8"  (1.727 m), weight 281 lb (127.5 kg), SpO2 98 %. Body mass index is 42.73 kg/m.  General: Cooperative, alert, well developed, in no acute distress. HEENT: Conjunctivae and lids unremarkable. Cardiovascular: Regular rhythm.  Lungs: Normal work of breathing. Neurologic: No focal deficits.   Lab Results  Component Value Date   CREATININE 0.69 11/03/2021   BUN 6 11/03/2021   NA 137 11/03/2021   K 4.6 11/03/2021   CL 102 11/03/2021   CO2 20 11/03/2021   Lab Results  Component Value Date   ALT 51 (H) 11/03/2021   AST 30 11/03/2021   ALKPHOS 71 11/03/2021   BILITOT 0.7 11/03/2021   Lab Results  Component Value Date   HGBA1C 6.1 (H) 11/03/2021   HGBA1C 5.9 05/02/2021   HGBA1C 6.0 12/27/2020   HGBA1C 6.0 (  H) 02/08/2020   HGBA1C 5.6 08/23/2019   Lab Results  Component Value Date   INSULIN 23.8 11/03/2021   INSULIN 14.7 05/13/2021   INSULIN 23.3 03/06/2020   INSULIN 12.7 08/23/2019   INSULIN 16.2 05/03/2019   Lab Results  Component Value Date   TSH 1.41 12/27/2020   Lab Results  Component Value Date   CHOL 227 (H) 11/03/2021   HDL 37 (L) 11/03/2021   LDLCALC 154 (H) 11/03/2021    LDLDIRECT 170.0 12/27/2020   TRIG 196 (H) 11/03/2021   CHOLHDL 6 12/27/2020   Lab Results  Component Value Date   VD25OH 46.0 11/03/2021   VD25OH 61.3 05/13/2021   VD25OH 50.7 02/08/2020   Lab Results  Component Value Date   WBC 6.2 12/27/2020   HGB 12.6 12/27/2020   HCT 38.0 12/27/2020   MCV 86.8 12/27/2020   PLT 394.0 12/27/2020   Lab Results  Component Value Date   IRON 62 01/05/2019   FERRITIN 57.7 01/05/2019    Attestation Statements:   Reviewed by clinician on day of visit: allergies, medications, problem list, medical history, surgical history, family history, social history, and previous encounter notes.  I, Georgianne Fick, FNP, am acting as transcriptionist for Dr. Loyal Gambler.  I have reviewed the above documentation for accuracy and completeness, and I agree with the above. Dell Ponto, DO

## 2022-01-13 ENCOUNTER — Ambulatory Visit: Payer: 59 | Admitting: Family Medicine

## 2022-01-13 ENCOUNTER — Encounter: Payer: Self-pay | Admitting: Family Medicine

## 2022-01-13 VITALS — BP 116/82 | HR 75 | Temp 97.5°F | Ht 68.0 in | Wt 282.5 lb

## 2022-01-13 DIAGNOSIS — R7303 Prediabetes: Secondary | ICD-10-CM | POA: Diagnosis not present

## 2022-01-13 DIAGNOSIS — Z23 Encounter for immunization: Secondary | ICD-10-CM | POA: Diagnosis not present

## 2022-01-13 DIAGNOSIS — E782 Mixed hyperlipidemia: Secondary | ICD-10-CM

## 2022-01-13 NOTE — Progress Notes (Signed)
Established Patient Office Visit  Subjective   Patient ID: Darlene Mccormick, female    DOB: 08/04/1975  Age: 46 y.o. MRN: [id number]  Chief Complaint  Patient presents with  . Establish Care    Patient is here for transition of care visit.   Patient reports she is feeling well today, no new issues or symptoms to report today. She is currently also being seen at the weight loss center. She recently was given rx's for phentermine and topiramate for weight loss. She has not started the medication et, had questions about the potential side effects. States that she has been exercising regularly to help with the weight loss. States she is on a 1600 calorie diet. Pt also reports a history of borderline diabetes as well.    Current Outpatient Medications  Medication Instructions  . Magnesium 500 mg, Oral, Daily  . Multiple Vitamin (MULTIVITAMIN WITH MINERALS) TABS tablet 1 tablet, Oral, Daily  . Phentermine HCl (LOMAIRA) 8 MG TABS 1 tab po 30 min before breakfast daily  . topiramate (TOPAMAX) 25 mg, Oral, Daily    Patient Active Problem List   Diagnosis Date Noted  . Mixed hyperlipidemia 12/30/2021  . Hypocalcemia 05/15/2021  . NAFLD (nonalcoholic fatty liver disease) 09/25/2019  . Prediabetes 06/08/2019  . Class 3 severe obesity with serious comorbidity and body mass index (BMI) of 40.0 to 44.9 in adult ([place]) 05/17/2019  . Fibroids 02/16/2018  . S/P myomectomy 02/16/2018  . Vitamin D deficiency 06/10/2016  . GAD (generalized anxiety disorder) 06/10/2016  . Hyperlipidemia, mixed 06/03/2016  . Morbid obesity ([place]) 06/03/2016      Review of Systems  All other systems reviewed and are negative.     Objective:     BP 116/82 (BP Location: Left Arm, Patient Position: Sitting, Cuff Size: Large)   Pulse 75   Temp (!) 97.5 F (36.4 C) (Oral)   Ht 5' 8"  (1.727 m)   Wt 282 lb 8 oz (128.1 kg)   LMP 01/04/2022 (Exact Date)   SpO2 99%   BMI 42.95 kg/m  {Vitals History  (Optional):23777}  Physical Exam Vitals reviewed.  Constitutional:      Appearance: Normal appearance. She is well-groomed. She is morbidly obese.  HENT:     Head: Normocephalic and atraumatic.     Mouth/Throat:     Mouth: Mucous membranes are moist.     Pharynx: Oropharynx is clear.  Eyes:     Extraocular Movements: Extraocular movements intact.     Conjunctiva/sclera: Conjunctivae normal.     Pupils: Pupils are equal, round, and reactive to light.  Cardiovascular:     Rate and Rhythm: Normal rate and regular rhythm.     Pulses: Normal pulses.     Heart sounds: S1 normal and S2 normal.  Pulmonary:     Effort: Pulmonary effort is normal.     Breath sounds: Normal breath sounds and air entry.  Abdominal:     General: Abdomen is flat. Bowel sounds are normal.     Palpations: Abdomen is soft.  Musculoskeletal:        General: Normal range of motion.     Cervical back: Normal range of motion and neck supple.     Right lower leg: No edema.     Left lower leg: No edema.  Skin:    General: Skin is warm and dry.  Neurological:     Mental Status: She is alert and oriented to person, place, and time. Mental status is at  baseline.     Gait: Gait is intact.  Psychiatric:        Mood and Affect: Mood and affect normal.        Speech: Speech normal.        Behavior: Behavior normal.        Judgment: Judgment normal.     No results found for any visits on 01/13/22.  {Labs (Optional):23779}  The 10-year ASCVD risk score (Arnett DK, et al., 2019) is: 1.5%    Assessment & Plan:   Problem List Items Addressed This Visit   None   No follow-ups on file.    Farrel Conners, MD

## 2022-01-14 NOTE — Assessment & Plan Note (Signed)
Not currently on treatment for this. I reviewed her most recent set of labs. Will continue to monitor yearly.

## 2022-01-14 NOTE — Assessment & Plan Note (Signed)
>>  ASSESSMENT AND PLAN FOR PREDIABETES WRITTEN ON 01/14/2022  1:50 PM BY Charolett Yarrow, HERON HERO, MD  Recommended reducing sugar and starches in her diet to help her achieve weight loss. She will continue to be seen at the weight loss center.

## 2022-01-14 NOTE — Assessment & Plan Note (Signed)
Recommended reducing sugar and starches in her diet to help her achieve weight loss. She will continue to be seen at the weight loss center.

## 2022-01-14 NOTE — Assessment & Plan Note (Signed)
We reviewed the risks/benefits of the medication that the weight loss center prescribed for her, questions answered today in the visit.

## 2022-01-28 ENCOUNTER — Ambulatory Visit (INDEPENDENT_AMBULATORY_CARE_PROVIDER_SITE_OTHER): Payer: 59 | Admitting: Family Medicine

## 2022-02-23 ENCOUNTER — Ambulatory Visit (INDEPENDENT_AMBULATORY_CARE_PROVIDER_SITE_OTHER): Payer: 59 | Admitting: Family Medicine

## 2022-02-23 ENCOUNTER — Encounter (INDEPENDENT_AMBULATORY_CARE_PROVIDER_SITE_OTHER): Payer: Self-pay | Admitting: Family Medicine

## 2022-02-23 VITALS — BP 140/86 | HR 76 | Temp 98.0°F | Ht 68.0 in | Wt 280.0 lb

## 2022-02-23 DIAGNOSIS — E559 Vitamin D deficiency, unspecified: Secondary | ICD-10-CM | POA: Diagnosis not present

## 2022-02-23 DIAGNOSIS — F3289 Other specified depressive episodes: Secondary | ICD-10-CM

## 2022-02-23 DIAGNOSIS — Z6841 Body Mass Index (BMI) 40.0 and over, adult: Secondary | ICD-10-CM

## 2022-02-23 DIAGNOSIS — R7303 Prediabetes: Secondary | ICD-10-CM | POA: Insufficient documentation

## 2022-02-23 DIAGNOSIS — F32A Depression, unspecified: Secondary | ICD-10-CM | POA: Insufficient documentation

## 2022-02-23 DIAGNOSIS — K760 Fatty (change of) liver, not elsewhere classified: Secondary | ICD-10-CM

## 2022-02-23 DIAGNOSIS — E669 Obesity, unspecified: Secondary | ICD-10-CM

## 2022-03-05 NOTE — Progress Notes (Addendum)
Chief Complaint:   OBESITY Darlene Mccormick is here to discuss her progress with her obesity treatment plan along with follow-up of her obesity related diagnoses. Darlene Mccormick is on keeping a food journal and adhering to recommended goals of 1600 calories and 90 protein and states she is following her eating plan approximately 40% of the time. Darlene Mccormick states she is exercising.  Today's visit was #: 8 Starting weight: 72 LBS Starting date: 05/13/2021 Today's weight: 280 LBS Today's date: 02/23/2022 Total lbs lost to date: 2 LBS Total lbs lost since last in-office visit: 1 LB  Interim History: Patient has not yet started Lomaira or Topamax.  Reports having a panic attack last night.  Her husband is supportive.  She traveled for Thanksgiving.  Her mother has dementia.  She has been getting in meals, but got off track over Thanksgiving.  She is not exercising.  Subjective:   1. Pre-diabetes Patient's last A1c was 6.1.  She has been inconsistent with exercise and is still intaking excess sugar.  2. NAFLD (nonalcoholic fatty liver disease) Visceral fat rating is unchanged at 15 goal is <10.  She has declined weight loss surgery.  3. Vitamin D deficiency She is off OTC vitamin D.  On 11/03/2021, her vitamin D level was 46.0.  4. Other depression with emotional eating Stress levels are high and she is tearful today.  Her husband and siblings are supportive.  She denies being a threat to herself or others.  Assessment/Plan:   1. Pre-diabetes Consider use of metformin.  Continue to work on diet and exercise.  2. NAFLD (nonalcoholic fatty liver disease) Continue active plan for weight reduction.  3. Vitamin D deficiency Begin vitamin D 4000 IU daily, OTC.  Recheck vitamin D level in February 2024.  4. Other depression with emotional eating Consider CBT with Dr Mallie Mussel or  use of Wellbutrin.  Discussed stress reduction techniques with patient today.  5. Obesity,current BMI 42.6 She agrees to starting  her Lomaira 8 mg tablet and topiramate 25 mg tablet to take every morning together, she has a prescription at the pharmacy already.  Darlene Mccormick is currently in the action stage of change. As such, her goal is to continue with weight loss efforts. She has agreed to keeping a food journal and adhering to recommended goals of 1600 calories and 90 protein daily.  Exercise goals:  Adding 15-minute walks daily.  Behavioral modification strategies: increasing lean protein intake, increasing vegetables, increasing water intake, decreasing eating out, no skipping meals, keeping healthy foods in the home, emotional eating strategies, and avoiding temptations.  Darlene Mccormick has agreed to follow-up with our clinic in 3-4 weeks. She was informed of the importance of frequent follow-up visits to maximize her success with intensive lifestyle modifications for her multiple health conditions.   Objective:   Blood pressure (!) 140/86, pulse 76, temperature 98 F (36.7 C), height 5' 8"  (1.727 m), weight 280 lb (127 kg), SpO2 93 %. Body mass index is 42.57 kg/m.  General: Cooperative, alert, well developed, in no acute distress. HEENT: Conjunctivae and lids unremarkable. Cardiovascular: Regular rhythm.  Lungs: Normal work of breathing. Neurologic: No focal deficits.   Lab Results  Component Value Date   CREATININE 0.69 11/03/2021   BUN 6 11/03/2021   NA 137 11/03/2021   K 4.6 11/03/2021   CL 102 11/03/2021   CO2 20 11/03/2021   Lab Results  Component Value Date   ALT 51 (H) 11/03/2021   AST 30 11/03/2021   ALKPHOS  71 11/03/2021   BILITOT 0.7 11/03/2021   Lab Results  Component Value Date   HGBA1C 6.1 (H) 11/03/2021   HGBA1C 5.9 05/02/2021   HGBA1C 6.0 12/27/2020   HGBA1C 6.0 (H) 02/08/2020   HGBA1C 5.6 08/23/2019   Lab Results  Component Value Date   INSULIN 23.8 11/03/2021   INSULIN 14.7 05/13/2021   INSULIN 23.3 03/06/2020   INSULIN 12.7 08/23/2019   INSULIN 16.2 05/03/2019   Lab Results   Component Value Date   TSH 1.41 12/27/2020   Lab Results  Component Value Date   CHOL 227 (H) 11/03/2021   HDL 37 (L) 11/03/2021   LDLCALC 154 (H) 11/03/2021   LDLDIRECT 170.0 12/27/2020   TRIG 196 (H) 11/03/2021   CHOLHDL 6 12/27/2020   Lab Results  Component Value Date   VD25OH 46.0 11/03/2021   VD25OH 61.3 05/13/2021   VD25OH 50.7 02/08/2020   Lab Results  Component Value Date   WBC 6.2 12/27/2020   HGB 12.6 12/27/2020   HCT 38.0 12/27/2020   MCV 86.8 12/27/2020   PLT 394.0 12/27/2020   Lab Results  Component Value Date   IRON 62 01/05/2019   FERRITIN 57.7 01/05/2019   Attestation Statements:   Reviewed by clinician on day of visit: allergies, medications, problem list, medical history, surgical history, family history, social history, and previous encounter notes.  I have personally spent 45 minutes total time today in preparation, patient care, and documentation for this visit, including the following: review of clinical lab tests; review of medical tests/procedures/services.    I, Davy Pique, am acting as Location manager for Loyal Gambler, DO.  I have reviewed the above documentation for accuracy and completeness, and I agree with the above. Dell Ponto, DO

## 2022-03-17 ENCOUNTER — Ambulatory Visit (INDEPENDENT_AMBULATORY_CARE_PROVIDER_SITE_OTHER): Payer: 59 | Admitting: Family Medicine

## 2022-04-07 ENCOUNTER — Ambulatory Visit (INDEPENDENT_AMBULATORY_CARE_PROVIDER_SITE_OTHER): Payer: 59 | Admitting: Family Medicine

## 2022-04-28 ENCOUNTER — Encounter (INDEPENDENT_AMBULATORY_CARE_PROVIDER_SITE_OTHER): Payer: Self-pay | Admitting: Family Medicine

## 2022-04-28 ENCOUNTER — Ambulatory Visit (INDEPENDENT_AMBULATORY_CARE_PROVIDER_SITE_OTHER): Payer: 59 | Admitting: Family Medicine

## 2022-04-28 VITALS — BP 122/81 | HR 83 | Temp 98.0°F | Ht 68.0 in | Wt 280.0 lb

## 2022-04-28 DIAGNOSIS — F3289 Other specified depressive episodes: Secondary | ICD-10-CM | POA: Diagnosis not present

## 2022-04-28 DIAGNOSIS — Z6841 Body Mass Index (BMI) 40.0 and over, adult: Secondary | ICD-10-CM

## 2022-04-28 DIAGNOSIS — R5383 Other fatigue: Secondary | ICD-10-CM | POA: Diagnosis not present

## 2022-04-28 DIAGNOSIS — E559 Vitamin D deficiency, unspecified: Secondary | ICD-10-CM

## 2022-04-28 DIAGNOSIS — R7303 Prediabetes: Secondary | ICD-10-CM

## 2022-04-28 DIAGNOSIS — E669 Obesity, unspecified: Secondary | ICD-10-CM

## 2022-04-29 LAB — COMPREHENSIVE METABOLIC PANEL
ALT: 53 IU/L — ABNORMAL HIGH (ref 0–32)
AST: 38 IU/L (ref 0–40)
Albumin/Globulin Ratio: 2 (ref 1.2–2.2)
Albumin: 4.6 g/dL (ref 3.9–4.9)
Alkaline Phosphatase: 73 IU/L (ref 44–121)
BUN/Creatinine Ratio: 17 (ref 9–23)
BUN: 9 mg/dL (ref 6–24)
Bilirubin Total: 0.9 mg/dL (ref 0.0–1.2)
CO2: 19 mmol/L — ABNORMAL LOW (ref 20–29)
Calcium: 8.8 mg/dL (ref 8.7–10.2)
Chloride: 101 mmol/L (ref 96–106)
Creatinine, Ser: 0.54 mg/dL — ABNORMAL LOW (ref 0.57–1.00)
Globulin, Total: 2.3 g/dL (ref 1.5–4.5)
Glucose: 100 mg/dL — ABNORMAL HIGH (ref 70–99)
Potassium: 4.5 mmol/L (ref 3.5–5.2)
Sodium: 137 mmol/L (ref 134–144)
Total Protein: 6.9 g/dL (ref 6.0–8.5)
eGFR: 116 mL/min/{1.73_m2} (ref >59)

## 2022-04-29 LAB — VITAMIN D 25 HYDROXY (VIT D DEFICIENCY, FRACTURES): Vit D, 25-Hydroxy: 34.2 ng/mL (ref 30.0–100.0)

## 2022-04-29 LAB — HEMOGLOBIN A1C
Est. average glucose Bld gHb Est-mCnc: 131 mg/dL
Hgb A1c MFr Bld: 6.2 % — ABNORMAL HIGH (ref 4.8–5.6)

## 2022-04-29 LAB — TSH RFX ON ABNORMAL TO FREE T4: TSH: 1.42 u[IU]/mL (ref 0.450–4.500)

## 2022-04-29 LAB — INSULIN, RANDOM: INSULIN: 21.1 u[IU]/mL (ref 2.6–24.9)

## 2022-05-05 LAB — HM MAMMOGRAPHY

## 2022-05-11 NOTE — Progress Notes (Signed)
Chief Complaint:   OBESITY Darlene Mccormick is here to discuss her progress with her obesity treatment plan along with follow-up of her obesity related diagnoses. Darlene Mccormick is on keeping a food journal and adhering to recommended goals of 1600 calories and 90 gms protein daily and states she is following her eating plan approximately 75% of the time. [name] states she is walking for 30 minutes 4 times per week.  Today's visit was #: 9 Starting weight: 282 Starting date: 05/13/21 Today's weight: 280 lbs Today's date: 04/28/22 Total lbs lost to date: 2 Total lbs lost since last in-office visit: 0  Interim History: Weight loss: 2 pounds in 11 months of medically supervised weight management.  Eating more vegetables and cooking at home more.  Has yet to start Lomaira or topiramate.  Under a lot of stress.  Lacks time for exercise.  Subjective:   1. Prediabetes A1c 6.1, fasting insulin 23.8 on 11/03/2021. Has struggled to comply with a low sugar/lower carb diet.  No regular exercise.  Tried metformin about 10 years ago.  2. Vitamin D deficiency 11/03/2021 vitamin D level 46. Taking over-the-counter vitamin D 4000 IU daily-not consistently taking. Complains of of fatigue.  3. Other fatigue Lacking adequate sleep at night.  4. Other depression, with emotional eating Worsening. Still struggling with emotional eating. Seeing a therapist.  Going through perimenopause and her mom has Alzheimer's disease.  Assessment/Plan:   1. Prediabetes Check fasting insulin and A1c.  Consider metformin use.  Reduce sugar intake. - Insulin, random - Hemoglobin A1c  2. Vitamin D deficiency Vitamin D level today. - VITAMIN D 25 Hydroxy (Vit-D Deficiency, Fractures)  3. Other fatigue Check TSH and CMP today. - TSH Rfx on Abnormal to Free T4 - Comprehensive metabolic panel  4. Other depression, with emotional eating Continue psychotherapy.  Work on stress reduction and mindfulness.  5. Obesity, current BMI  42.6 1.  Discussed the option of weight loss surgery given her long history of obesity with failure to see weight loss over the past year but she declined. 2.  Focus on mental health counseling for 8 weeks then return.  [name] is currently in the action stage of change. As such, her goal is to continue with weight loss efforts. She has agreed to keeping a food journal and adhering to recommended goals of 1600 calories and 90 grams protein daily.   Exercise goals: Meeting 3 times per week-plans to restart.  Behavioral modification strategies: increasing lean protein intake, increasing vegetables, increasing water intake, decreasing eating out, no skipping meals, meal planning and cooking strategies, keeping healthy foods in the home, and planning for success.  [name] has agreed to follow-up with our clinic in 8 weeks. She was informed of the importance of frequent follow-up visits to maximize her success with intensive lifestyle modifications for her multiple health conditions.   [name] was informed we would discuss her lab results at her next visit unless there is a critical issue that needs to be addressed sooner. Shanell agreed to keep her next visit at the agreed upon time to discuss these results.  Objective:   Blood pressure 122/81, pulse 83, temperature 98 F (36.7 C), height 5' 8"$  (1.727 m), weight 280 lb (127 kg), SpO2 97 %. Body mass index is 42.57 kg/m.  General: Cooperative, alert, well developed, in no acute distress. HEENT: Conjunctivae and lids unremarkable. Cardiovascular: Regular rhythm.  Lungs: Normal work of breathing. Neurologic: No focal deficits.   Lab Results  Component Value  Date   CREATININE 0.54 (L) 04/28/2022   BUN 9 04/28/2022   NA 137 04/28/2022   K 4.5 04/28/2022   CL 101 04/28/2022   CO2 19 (L) 04/28/2022   Lab Results  Component Value Date   ALT 53 (H) 04/28/2022   AST 38 04/28/2022   ALKPHOS 73 04/28/2022   BILITOT 0.9 04/28/2022   Lab Results   Component Value Date   HGBA1C 6.2 (H) 04/28/2022   HGBA1C 6.1 (H) 11/03/2021   HGBA1C 5.9 05/02/2021   HGBA1C 6.0 12/27/2020   HGBA1C 6.0 (H) 02/08/2020   Lab Results  Component Value Date   INSULIN 21.1 04/28/2022   INSULIN 23.8 11/03/2021   INSULIN 14.7 05/13/2021   INSULIN 23.3 03/06/2020   INSULIN 12.7 08/23/2019   Lab Results  Component Value Date   TSH 1.420 04/28/2022   Lab Results  Component Value Date   CHOL 227 (H) 11/03/2021   HDL 37 (L) 11/03/2021   LDLCALC 154 (H) 11/03/2021   LDLDIRECT 170.0 12/27/2020   TRIG 196 (H) 11/03/2021   CHOLHDL 6 12/27/2020   Lab Results  Component Value Date   VD25OH 34.2 04/28/2022   VD25OH 46.0 11/03/2021   VD25OH 61.3 05/13/2021   Lab Results  Component Value Date   WBC 6.2 12/27/2020   HGB 12.6 12/27/2020   HCT 38.0 12/27/2020   MCV 86.8 12/27/2020   PLT 394.0 12/27/2020   Lab Results  Component Value Date   IRON 62 01/05/2019   FERRITIN 57.7 01/05/2019     Attestation Statements:   Reviewed by clinician on day of visit: allergies, medications, problem list, medical history, surgical history, family history, social history, and previous encounter notes.  I, Georgianne Fick, FNP, am acting as transcriptionist for Dr. Loyal Gambler.  I have reviewed the above documentation for accuracy and completeness, and I agree with the above. Dell Ponto, DO

## 2022-06-22 ENCOUNTER — Ambulatory Visit (INDEPENDENT_AMBULATORY_CARE_PROVIDER_SITE_OTHER): Payer: 59 | Admitting: Family Medicine

## 2022-07-16 ENCOUNTER — Ambulatory Visit (INDEPENDENT_AMBULATORY_CARE_PROVIDER_SITE_OTHER): Payer: 59 | Admitting: Family Medicine

## 2022-08-20 ENCOUNTER — Emergency Department (HOSPITAL_COMMUNITY): Payer: 59

## 2022-08-20 ENCOUNTER — Other Ambulatory Visit: Payer: Self-pay

## 2022-08-20 ENCOUNTER — Emergency Department (HOSPITAL_COMMUNITY)
Admission: EM | Admit: 2022-08-20 | Discharge: 2022-08-21 | Disposition: A | Discharge status: Home / Self Care | Payer: 59 | Attending: Emergency Medicine | Admitting: Emergency Medicine

## 2022-08-20 ENCOUNTER — Encounter (HOSPITAL_COMMUNITY): Payer: Self-pay | Admitting: Emergency Medicine

## 2022-08-20 DIAGNOSIS — R079 Chest pain, unspecified: Secondary | ICD-10-CM | POA: Diagnosis present

## 2022-08-20 DIAGNOSIS — Z7982 Long term (current) use of aspirin: Secondary | ICD-10-CM | POA: Insufficient documentation

## 2022-08-20 LAB — I-STAT BETA HCG BLOOD, ED (MC, WL, AP ONLY): I-stat hCG, quantitative: <5 m[IU]/mL (ref <5)

## 2022-08-20 NOTE — ED Triage Notes (Signed)
Pt arrives via POV c/o centralized-left sided chest pressure that has been intermittent 1-2 days. Worsening when lying down. Pain radiates into the left shoulder. Associated with back pain and SOB.

## 2022-08-21 ENCOUNTER — Emergency Department (HOSPITAL_COMMUNITY): Payer: 59

## 2022-08-21 LAB — CBC
HCT: 39.2 % (ref 36.0–46.0)
Hemoglobin: 12.9 g/dL (ref 12.0–15.0)
MCH: 28.6 pg (ref 26.0–34.0)
MCHC: 32.9 g/dL (ref 30.0–36.0)
MCV: 86.9 fL (ref 80.0–100.0)
Platelets: 387 10*3/uL (ref 150–400)
RBC: 4.51 MIL/uL (ref 3.87–5.11)
RDW: 12.7 % (ref 11.5–15.5)
WBC: 8.9 10*3/uL (ref 4.0–10.5)
nRBC: 0 % (ref 0.0–0.2)

## 2022-08-21 LAB — BASIC METABOLIC PANEL
Anion gap: 10 (ref 5–15)
BUN: 6 mg/dL (ref 6–20)
CO2: 22 mmol/L (ref 22–32)
Calcium: 8.8 mg/dL — ABNORMAL LOW (ref 8.9–10.3)
Chloride: 103 mmol/L (ref 98–111)
Creatinine, Ser: 0.7 mg/dL (ref 0.44–1.00)
GFR, Estimated: >60 mL/min (ref >60)
Glucose, Bld: 115 mg/dL — ABNORMAL HIGH (ref 70–99)
Potassium: 4 mmol/L (ref 3.5–5.1)
Sodium: 135 mmol/L (ref 135–145)

## 2022-08-21 LAB — HEPATIC FUNCTION PANEL
ALT: 48 U/L — ABNORMAL HIGH (ref 0–44)
AST: 34 U/L (ref 15–41)
Albumin: 3.8 g/dL (ref 3.5–5.0)
Alkaline Phosphatase: 49 U/L (ref 38–126)
Bilirubin, Direct: <0.1 mg/dL (ref 0.0–0.2)
Total Bilirubin: 1.5 mg/dL — ABNORMAL HIGH (ref 0.3–1.2)
Total Protein: 6.8 g/dL (ref 6.5–8.1)

## 2022-08-21 LAB — TROPONIN I (HIGH SENSITIVITY)
Troponin I (High Sensitivity): 4 ng/L (ref <18)
Troponin I (High Sensitivity): 4 ng/L (ref <18)

## 2022-08-21 LAB — D-DIMER, QUANTITATIVE: D-Dimer, Quant: <0.27 ug/mL-FEU (ref 0.00–0.50)

## 2022-08-21 MED ORDER — ASPIRIN 81 MG PO CHEW: 81.0000 mg | CHEWABLE_TABLET | Freq: Every day | ORAL | 1 refills | Status: DC

## 2022-08-21 NOTE — ED Provider Notes (Signed)
[place] EMERGENCY DEPARTMENT AT [facility] Provider Note   CSN: [id number] Arrival date & time: 08/20/22  2307     History  Chief Complaint  Patient presents with   Chest Pain    Darlene Mccormick is a 47 y.o. female.  47 year old female who presents ER today with chest pain.  Patient states she had on and off for the last couple days.  Feels little bit pressure-like.  Has a paresthesias in the fingertips of her left hand.  Also has a little bit of paresthesias in her left jaw.  Intermittent dyspnea.  Not related to exertion.  Not related to position.  Not related to eating.  Not had symptoms like this before.  No fever or cough.  No nausea or vomiting.  No associated diaphoresis or lightheadedness.  Currently not having discomfort.  No significant past medical history.  Is on a birth control pill for perimenopause.   Chest Pain      Home Medications Prior to Admission medications   Medication Sig Start Date End Date Taking? Authorizing Provider  aspirin 81 MG chewable tablet Chew 1 tablet (81 mg total) by mouth daily. 08/21/22  Yes Soni Kegel, Barbara Cower, MD  Magnesium 500 MG CAPS Take 500 mg by mouth daily.    [provider]  Multiple Vitamin (MULTIVITAMIN WITH MINERALS) TABS tablet Take 1 tablet by mouth daily.    [provider]      Allergies    Patient has no known allergies.    Review of Systems   Review of Systems  Cardiovascular:  Positive for chest pain.    Physical Exam Updated Vital Signs BP 138/81 (BP Location: Left Arm)   Pulse 63   Temp 98.3 F (36.8 C) (Oral)   Resp 16   Ht 5' 8.5" (1.74 m)   Wt 127 kg   LMP 07/30/2022   SpO2 100%   BMI 41.95 kg/m  Physical Exam Vitals and nursing note reviewed.  Constitutional:      Appearance: She is well-developed.  HENT:     Head: Normocephalic and atraumatic.  Cardiovascular:     Rate and Rhythm: Normal rate and regular rhythm.  Pulmonary:     Effort: No respiratory  distress.     Breath sounds: No stridor. No decreased breath sounds, wheezing, rhonchi or rales.  Abdominal:     General: There is no distension.     Palpations: Abdomen is soft.  Musculoskeletal:        General: Normal range of motion.     Cervical back: Normal range of motion.     Right lower leg: No edema.     Left lower leg: No edema.  Skin:    General: Skin is warm and dry.  Neurological:     Mental Status: She is alert.     ED Results / Procedures / Treatments   Labs (all labs ordered are listed, but only abnormal results are displayed) Labs Reviewed  BASIC METABOLIC PANEL - Abnormal; Notable for the following components:      Result Value   Glucose, Bld 115 (*)    Calcium 8.8 (*)    All other components within normal limits  HEPATIC FUNCTION PANEL - Abnormal; Notable for the following components:   ALT 48 (*)    Total Bilirubin 1.5 (*)    All other components within normal limits  CBC  D-DIMER, QUANTITATIVE  I-STAT BETA HCG BLOOD, ED ([facility], [facility], [facility] ONLY)  TROPONIN I (HIGH  SENSITIVITY)  TROPONIN I (HIGH SENSITIVITY)    EKG EKG Interpretation  Date/Time:  Thursday Aug 20 2022 22:56:06 EDT Ventricular Rate:  83 PR Interval:  162 QRS Duration: 72 QT Interval:  372 QTC Calculation: 437 R Axis:   42 Text Interpretation: Normal sinus rhythm Normal ECG No previous ECGs available Confirmed by Marily Memos [phone]) on 08/21/2022 1:49:03 AM  Radiology DG Chest 2 View  Result Date: 08/21/2022 CLINICAL DATA:  Intermittent left-sided chest pressure x2 days. EXAM: CHEST - 2 VIEW COMPARISON:  December 03, 2018 FINDINGS: The heart size and mediastinal contours are within normal limits. Both lungs are clear. A 2.1 cm gallstone is seen within the right upper quadrant. The visualized skeletal structures are unremarkable. IMPRESSION: No active cardiopulmonary disease. Electronically Signed   By: Aram Candela M.D.   On: 08/21/2022 00:12    Procedures Procedures     Medications Ordered in ED Medications - No data to display  ED Course/ Medical Decision Making/ A&P                             Medical Decision Making Amount and/or Complexity of Data Reviewed Labs: ordered. Radiology: ordered.  Risk OTC drugs.   D dimer reassuring, doubt PE, no indication for ct Troponins normal and ecg reassuring, doubt ACS.  Cxr and symptoms not c/w pneumonia, PTX or aortic aneurysm/dissection.  No real GI symptoms to suggest spasm, esophagitis, gastritis or PUD.  No trauma to suggest contusion or fractures.    Final Clinical Impression(s) / ED Diagnoses Final diagnoses:  Nonspecific chest pain    Rx / DC Orders ED Discharge Orders          Ordered    Ambulatory referral to Cardiology        08/21/22 0352    aspirin 81 MG chewable tablet  Daily        08/21/22 0352              Triniti Gruetzmacher, Barbara Cower, MD 08/21/22 [phone]

## 2022-09-09 ENCOUNTER — Ambulatory Visit (INDEPENDENT_AMBULATORY_CARE_PROVIDER_SITE_OTHER): Payer: 59 | Admitting: Family Medicine

## 2022-09-09 ENCOUNTER — Encounter (INDEPENDENT_AMBULATORY_CARE_PROVIDER_SITE_OTHER): Payer: Self-pay | Admitting: Family Medicine

## 2022-09-09 VITALS — BP 147/83 | HR 81 | Temp 98.6°F | Ht 68.0 in | Wt 289.0 lb

## 2022-09-09 DIAGNOSIS — Z6841 Body Mass Index (BMI) 40.0 and over, adult: Secondary | ICD-10-CM | POA: Diagnosis not present

## 2022-09-09 DIAGNOSIS — R7303 Prediabetes: Secondary | ICD-10-CM

## 2022-09-09 DIAGNOSIS — E559 Vitamin D deficiency, unspecified: Secondary | ICD-10-CM

## 2022-09-09 MED ORDER — METFORMIN HCL ER 500 MG PO TB24
500.0000 mg | ORAL_TABLET | Freq: Every day | ORAL | 0 refills | Status: DC
Start: 2022-09-09 — End: 2022-11-02

## 2022-09-09 NOTE — Assessment & Plan Note (Signed)
Last vitamin D Lab Results  Component Value Date   VD25OH 34.2 04/28/2022   She has been taking OTC Vitamin D 4,000 IU daily  Recheck vitamin D level today Goal is >50

## 2022-09-09 NOTE — Assessment & Plan Note (Signed)
Lab Results  Component Value Date   HGBA1C 6.2 (H) 04/28/2022   She has never been on metformin though her A1c has been in the prediabetic range and her fasting insulin was high at 21.1.  Resume prescribed dietary plan, reducing high sugar foods and drinks Will work to increase walking time Repeat A1c today and check fasting insulin next visit Begin metformin XR 500 mg once daily with food.  Reviewed potential adverse SE

## 2022-09-09 NOTE — Assessment & Plan Note (Signed)
>>  ASSESSMENT AND PLAN FOR PREDIABETES WRITTEN ON 09/09/2022  1:21 PM BY BOWEN, KAREN E, DO  Lab Results  Component Value Date   HGBA1C 6.2 (H) 04/28/2022   She has never been on metformin  though her A1c has been in the prediabetic range and her fasting insulin  was high at 21.1.  Resume prescribed dietary plan, reducing high sugar foods and drinks Will work to increase walking time Repeat A1c today and check fasting insulin  next visit Begin metformin  XR 500 mg once daily with food.  Reviewed potential adverse SE

## 2022-09-09 NOTE — Progress Notes (Signed)
Office: [phone]  /  Fax: [phone]  WEIGHT SUMMARY AND BIOMETRICS  Starting Date: 05/13/21  Starting Weight: 282lb   Weight Lost Since Last Visit: 0lb   Vitals Temp: 98.6 F (37 C) BP: (!) 147/83 Pulse Rate: 81 SpO2: 98 %   Body Composition  Body Fat %: 50.8 % Fat Mass (lbs): 147 lbs Muscle Mass (lbs): 135.2 lbs Total Body Water (lbs): 110.8 lbs Visceral Fat Rating : 16     HPI  Chief Complaint: OBESITY  Darlene Mccormick is here to discuss her progress with her obesity treatment plan. She is on the keeping a food journal and adhering to recommended goals of 1700 calories and 90 protein and states she is following her eating plan approximately 60 % of the time. She states she is exercising 30 minutes 4 times per week.   Interval History:  Since last office visit she is up 9 lb She has had a hard time planning meals, eating out on the way home, making time to cook due to her work schedule She took 3 mos of low dose estrogen for perimenopause and this caused her BP to elevate She has not been seen in 5 mos She has not been doing any regular exercise  Pharmacotherapy: none  PHYSICAL EXAM:  Blood pressure (!) 147/83, pulse 81, temperature 98.6 F (37 C), height 5\' 8"  (1.727 m), weight 289 lb (131.1 kg), last menstrual period 07/30/2022, SpO2 98 %. Body mass index is 43.94 kg/m.  General: She is overweight, cooperative, alert, well developed, and in no acute distress. PSYCH: Has normal mood, affect and thought process.   Lungs: Normal breathing effort, no conversational dyspnea.   ASSESSMENT AND PLAN  TREATMENT PLAN FOR OBESITY:  Recommended Dietary Goals  Levette is currently in the action stage of change. As such, her goal is to continue weight management plan. She has agreed to keeping a food journal and adhering to recommended goals of 1700 calories and  g 120 protein.  Behavioral Intervention  We discussed the following Behavioral Modification Strategies  today: increasing lean protein intake, decreasing simple carbohydrates , increasing vegetables, increasing lower glycemic fruits, avoiding skipping meals, increasing water intake, work on tracking and journaling calories using tracking application, continue to practice mindfulness when eating, and planning for success.  Additional resources provided today: NA  Recommended Physical Activity Goals  Aeliana has been advised to work up to 150 minutes of moderate intensity aerobic activity a week and strengthening exercises 2-3 times per week for cardiovascular health, weight loss maintenance and preservation of muscle mass.   She has agreed to Think about ways to increase daily physical activity and overcoming barriers to exercise  Pharmacotherapy changes for the treatment of obesity: start metformin XR 500 mg daily  ASSOCIATED CONDITIONS ADDRESSED TODAY  Prediabetes Assessment & Plan: Lab Results  Component Value Date   HGBA1C 6.2 (H) 04/28/2022   She has never been on metformin though her A1c has been in the prediabetic range and her fasting insulin was high at 21.1.  Resume prescribed dietary plan, reducing high sugar foods and drinks Will work to increase walking time Repeat A1c today and check fasting insulin next visit Begin metformin XR 500 mg once daily with food.  Reviewed potential adverse SE  Orders: -     Hemoglobin A1c -     metFORMIN HCl ER; Take 1 tablet (500 mg total) by mouth daily with breakfast.  Dispense: 30 tablet; Refill: 0  Class 3 severe obesity due to  excess calories with serious comorbidity and body mass index (BMI) of 40.0 to 44.9 in adult [facility]) Assessment & Plan: Reviewed patient's overall progress.  She is up 28 lb since her first visit > 3 years ago She lacks insurance coverage for AOMs She has struggled to comply with recommended dietary plan or regular exercise  We discussed her motivation to make changes, AOMs and will consider sleep study given her neck  circumference Consider weight loss surgery if not seeing further progess   Vitamin D deficiency Assessment & Plan: Last vitamin D Lab Results  Component Value Date   VD25OH 34.2 04/28/2022   She has been taking OTC Vitamin D 4,000 IU daily  Recheck vitamin D level today Goal is >50  Orders: -     VITAMIN D 25 Hydroxy (Vit-D Deficiency, Fractures)      She was informed of the importance of frequent follow up visits to maximize her success with intensive lifestyle modifications for her multiple health conditions.   ATTESTASTION STATEMENTS:  Reviewed by clinician on day of visit: allergies, medications, problem list, medical history, surgical history, family history, social history, and previous encounter notes pertinent to obesity diagnosis.   I have personally spent 30 minutes total time today in preparation, patient care, nutritional counseling and documentation for this visit, including the following: review of clinical lab tests; review of medical tests/procedures/services.      Glennis Brink, DO DABFM, DABOM [facility] [address] [place], [place] [address] [phone]

## 2022-09-09 NOTE — Assessment & Plan Note (Signed)
Reviewed patient's overall progress.  She is up 28 lb since her first visit > 3 years ago She lacks insurance coverage for AOMs She has struggled to comply with recommended dietary plan or regular exercise  We discussed her motivation to make changes, AOMs and will consider sleep study given her neck circumference Consider weight loss surgery if not seeing further progess

## 2022-09-10 LAB — HEMOGLOBIN A1C
Est. average glucose Bld gHb Est-mCnc: 131 mg/dL
Hgb A1c MFr Bld: 6.2 % — ABNORMAL HIGH (ref 4.8–5.6)

## 2022-09-10 LAB — VITAMIN D 25 HYDROXY (VIT D DEFICIENCY, FRACTURES): Vit D, 25-Hydroxy: 35.7 ng/mL (ref 30.0–100.0)

## 2022-09-17 LAB — HM COLONOSCOPY

## 2022-09-28 ENCOUNTER — Encounter (INDEPENDENT_AMBULATORY_CARE_PROVIDER_SITE_OTHER): Payer: Self-pay | Admitting: Family Medicine

## 2022-09-28 ENCOUNTER — Ambulatory Visit (INDEPENDENT_AMBULATORY_CARE_PROVIDER_SITE_OTHER): Payer: 59 | Admitting: Family Medicine

## 2022-09-28 ENCOUNTER — Telehealth (INDEPENDENT_AMBULATORY_CARE_PROVIDER_SITE_OTHER): Payer: Self-pay | Admitting: Family Medicine

## 2022-09-28 VITALS — BP 103/57 | HR 71 | Temp 97.9°F | Ht 68.0 in | Wt 287.0 lb

## 2022-09-28 DIAGNOSIS — K760 Fatty (change of) liver, not elsewhere classified: Secondary | ICD-10-CM

## 2022-09-28 DIAGNOSIS — E559 Vitamin D deficiency, unspecified: Secondary | ICD-10-CM | POA: Diagnosis not present

## 2022-09-28 DIAGNOSIS — E782 Mixed hyperlipidemia: Secondary | ICD-10-CM

## 2022-09-28 DIAGNOSIS — R7303 Prediabetes: Secondary | ICD-10-CM

## 2022-09-28 DIAGNOSIS — R7989 Other specified abnormal findings of blood chemistry: Secondary | ICD-10-CM

## 2022-09-28 DIAGNOSIS — E88819 Insulin resistance, unspecified: Secondary | ICD-10-CM | POA: Insufficient documentation

## 2022-09-28 DIAGNOSIS — Z6841 Body Mass Index (BMI) 40.0 and over, adult: Secondary | ICD-10-CM

## 2022-09-28 MED ORDER — SEMAGLUTIDE(0.25 OR 0.5MG/DOS) 2 MG/3ML ~~LOC~~ SOPN
0.2500 mg | PEN_INJECTOR | SUBCUTANEOUS | 0 refills | Status: DC
Start: 2022-09-28 — End: 2022-10-30

## 2022-09-28 MED ORDER — VITAMIN D (ERGOCALCIFEROL) 1.25 MG (50000 UNIT) PO CAPS
50000.0000 [IU] | ORAL_CAPSULE | ORAL | 0 refills | Status: DC
Start: 2022-09-28 — End: 2022-11-02

## 2022-09-28 NOTE — Progress Notes (Signed)
Office: [phone]  /  Fax: [phone]  WEIGHT SUMMARY AND BIOMETRICS  Starting Date: 05/13/21  Starting Weight: 282lb   Weight Lost Since Last Visit: 2lb   Vitals Temp: 97.9 F (36.6 C) BP: (!) 103/57 Pulse Rate: 71 SpO2: 96 %   Body Composition  Body Fat %: 45.9 % Fat Mass (lbs): 132 lbs Muscle Mass (lbs): 147.6 lbs Total Body Water (lbs): 113.8 lbs Visceral Fat Rating : 14     HPI  Chief Complaint: OBESITY  Darlene Mccormick is here to discuss her progress with her obesity treatment plan. She is on the keeping a food journal and adhering to recommended goals of 1700 calories and 90 protein and states she is following her eating plan approximately 50 % of the time. She states she is exercising 30 minutes 4 times per week.   Interval History:  Since last office visit she is down 2 lb She just got back from a [place] She has not yet started on RX Metformin She has a net weight gain of 5 pounds in the past 1 year of medically supervised weight management She does complain of hunger.  She is inconsistently tracking her caloric intake.  She has increased her walking time.  Pharmacotherapy: None  PHYSICAL EXAM:  Blood pressure (!) 103/57, pulse 71, temperature 97.9 F (36.6 C), height 5\' 8"  (1.727 m), weight 287 lb (130.2 kg), SpO2 96 %. Body mass index is 43.64 kg/m.  General: She is overweight, cooperative, alert, well developed, and in no acute distress. PSYCH: Has normal mood, affect and thought process.   Lungs: Normal breathing effort, no conversational dyspnea.   ASSESSMENT AND PLAN  TREATMENT PLAN FOR OBESITY:  Recommended Dietary Goals  Brishae is currently in the action stage of change. As such, her goal is to continue weight management plan. She has agreed to keeping a food journal and adhering to recommended goals of 1700 calories and 90 g of protein.  Behavioral Intervention  We discussed the following  Behavioral Modification Strategies today: increasing lean protein intake, decreasing simple carbohydrates , increasing vegetables, increasing lower glycemic fruits, increasing fiber rich foods, avoiding skipping meals, increasing water intake, work on meal planning and preparation, keeping healthy foods at home, continue to practice mindfulness when eating, and planning for success.  Additional resources provided today: NA  Recommended Physical Activity Goals  Lyriq has been advised to work up to 150 minutes of moderate intensity aerobic activity a week and strengthening exercises 2-3 times per week for cardiovascular health, weight loss maintenance and preservation of muscle mass.   She has agreed to Increase the intensity, frequency or duration of aerobic exercises    Pharmacotherapy changes for the treatment of obesity: Start metformin XR 500 mg once daily  ASSOCIATED CONDITIONS ADDRESSED TODAY  Insulin resistance Assessment & Plan: Reviewed labs from April 28, 2022 showing a fasting insulin elevated at 21.1. She does have increased hunger related to insulin resistance with sugar and carb cravings. She is actively working on dietary changes, dietary logging and increasing walking time.  She is a good candidate for use of Ozempic 0.25 mg once weekly injection for the treatment of insulin resistance. Patient denies a personal or family history of pancreatitis, medullary thyroid carcinoma or multiple endocrine neoplasia type II. Recommend reviewing pen training video online.   Orders: -     Semaglutide(0.25 or 0.5MG /DOS); Inject 0.25 mg into the skin once a week.  Dispense: 3 mL; Refill:  0 -     Insulin, random  Prediabetes Assessment & Plan: Lab Results  Component Value Date   HGBA1C 6.2 (H) 09/09/2022   She was prescribed metformin XR 500 mg once daily with food last visit but has not yet started.  She did pick up her prescription from the pharmacy and plans to start it today.   She is aware to take this medication with food.  We have previously discussed potential adverse side effects.  She is actively working on reducing her intake of sugar, reading labels and reducing her starch intake.  She has been walking more often.  Start metformin XR 500 mg once daily with food.  Call if any problems or questions.  Continue working on prescribed dietary plan with a low sugar/low starch diet.  Increase walking time to at least 6000 steps a day.   Vitamin D deficiency Assessment & Plan: Last vitamin D Lab Results  Component Value Date   VD25OH 35.7 09/09/2022   Reviewed labs from last visit.  Her vitamin D level remains low normal under 50. She does complain of fatigue.  She has been inconsistently taking over-the-counter vitamin D supplement at 5000 IU daily.  She agrees to discontinuing over-the-counter vitamin D and switching over to prescription vitamin D 50,000 IU once weekly.  Recheck level in 3 to 4 months.  Orders: -     Vitamin D (Ergocalciferol); Take 1 capsule (50,000 Units total) by mouth every 7 (seven) days.  Dispense: 5 capsule; Refill: 0  NAFLD (nonalcoholic fatty liver disease) Assessment & Plan: Her liver enzymes remain mildly elevated with known fatty liver disease. She is actively working on weight loss. We discussed new studies using semaglutide for the treatment of MASLD  Continue active plan for weight reduction for the treatment of fatty liver disease.  Recheck chemistry panel today.  Orders: -     Comprehensive metabolic panel  Mixed hyperlipidemia Assessment & Plan: Lab Results  Component Value Date   CHOL 227 (H) 11/03/2021   HDL 37 (L) 11/03/2021   LDLCALC 154 (H) 11/03/2021   LDLDIRECT 170.0 12/27/2020   TRIG 196 (H) 11/03/2021   CHOLHDL 6 12/27/2020   Patient has been working on a low saturated fat diet.  She has failed to see weight loss in the last 1+ years of medically supervised weight management.  She is currently not on any  lipid-lowering medication.  Recheck fasting lipid panel today looking for improvements compared to last August.  Orders: -     Lipid panel  Low vitamin B12 level -     Vitamin B12  Class 3 severe obesity due to excess calories with serious comorbidity and body mass index (BMI) of 40.0 to 44.9 in adult [facility])      She was informed of the importance of frequent follow up visits to maximize her success with intensive lifestyle modifications for her multiple health conditions.   ATTESTASTION STATEMENTS:  Reviewed by clinician on day of visit: allergies, medications, problem list, medical history, surgical history, family history, social history, and previous encounter notes pertinent to obesity diagnosis.   I have personally spent 30 minutes total time today in preparation, patient care, nutritional counseling and documentation for this visit, including the following: review of clinical lab tests; review of medical tests/procedures/services.      Glennis Brink, DO DABFM, DABOM [facility] [address] [place], [place] [address] [phone]

## 2022-09-28 NOTE — Assessment & Plan Note (Signed)
Lab Results  Component Value Date   HGBA1C 6.2 (H) 09/09/2022   She was prescribed metformin XR 500 mg once daily with food last visit but has not yet started.  She did pick up her prescription from the pharmacy and plans to start it today.  She is aware to take this medication with food.  We have previously discussed potential adverse side effects.  She is actively working on reducing her intake of sugar, reading labels and reducing her starch intake.  She has been walking more often.  Start metformin XR 500 mg once daily with food.  Call if any problems or questions.  Continue working on prescribed dietary plan with a low sugar/low starch diet.  Increase walking time to at least 6000 steps a day.

## 2022-09-28 NOTE — Telephone Encounter (Signed)
Prior authorization done via cover my meds for patients Ozempic.  

## 2022-09-28 NOTE — Assessment & Plan Note (Signed)
Lab Results  Component Value Date   CHOL 227 (H) 11/03/2021   HDL 37 (L) 11/03/2021   LDLCALC 154 (H) 11/03/2021   LDLDIRECT 170.0 12/27/2020   TRIG 196 (H) 11/03/2021   CHOLHDL 6 12/27/2020   Patient has been working on a low saturated fat diet.  She has failed to see weight loss in the last 1+ years of medically supervised weight management.  She is currently not on any lipid-lowering medication.  Recheck fasting lipid panel today looking for improvements compared to last August.

## 2022-09-28 NOTE — Assessment & Plan Note (Signed)
Her liver enzymes remain mildly elevated with known fatty liver disease. She is actively working on weight loss. We discussed new studies using semaglutide for the treatment of MASLD  Continue active plan for weight reduction for the treatment of fatty liver disease.  Recheck chemistry panel today.

## 2022-09-28 NOTE — Assessment & Plan Note (Signed)
Reviewed labs from April 28, 2022 showing a fasting insulin elevated at 21.1. She does have increased hunger related to insulin resistance with sugar and carb cravings. She is actively working on dietary changes, dietary logging and increasing walking time.  She is a good candidate for use of Ozempic 0.25 mg once weekly injection for the treatment of insulin resistance. Patient denies a personal or family history of pancreatitis, medullary thyroid carcinoma or multiple endocrine neoplasia type II. Recommend reviewing pen training video online.

## 2022-09-28 NOTE — Assessment & Plan Note (Signed)
Last vitamin D Lab Results  Component Value Date   VD25OH 35.7 09/09/2022   Reviewed labs from last visit.  Her vitamin D level remains low normal under 50. She does complain of fatigue.  She has been inconsistently taking over-the-counter vitamin D supplement at 5000 IU daily.  She agrees to discontinuing over-the-counter vitamin D and switching over to prescription vitamin D 50,000 IU once weekly.  Recheck level in 3 to 4 months.

## 2022-09-28 NOTE — Assessment & Plan Note (Signed)
>>  ASSESSMENT AND PLAN FOR PREDIABETES WRITTEN ON 09/28/2022  1:14 PM BY BOWEN, KAREN E, DO  Lab Results  Component Value Date   HGBA1C 6.2 (H) 09/09/2022   She was prescribed metformin  XR 500 mg once daily with food last visit but has not yet started.  She did pick up her prescription from the pharmacy and plans to start it today.  She is aware to take this medication with food.  We have previously discussed potential adverse side effects.  She is actively working on reducing her intake of sugar, reading labels and reducing her starch intake.  She has been walking more often.  Start metformin  XR 500 mg once daily with food.  Call if any problems or questions.  Continue working on prescribed dietary plan with a low sugar/low starch diet.  Increase walking time to at least 6000 steps a day.

## 2022-09-29 LAB — COMPREHENSIVE METABOLIC PANEL
ALT: 104 IU/L — ABNORMAL HIGH (ref 0–32)
AST: 73 IU/L — ABNORMAL HIGH (ref 0–40)
Albumin: 4.5 g/dL (ref 3.9–4.9)
Alkaline Phosphatase: 67 IU/L (ref 44–121)
BUN/Creatinine Ratio: 12 (ref 9–23)
BUN: 7 mg/dL (ref 6–24)
Bilirubin Total: 0.9 mg/dL (ref 0.0–1.2)
CO2: 23 mmol/L (ref 20–29)
Calcium: 8.7 mg/dL (ref 8.7–10.2)
Chloride: 101 mmol/L (ref 96–106)
Creatinine, Ser: 0.6 mg/dL (ref 0.57–1.00)
Globulin, Total: 2.3 g/dL (ref 1.5–4.5)
Glucose: 98 mg/dL (ref 70–99)
Potassium: 4.9 mmol/L (ref 3.5–5.2)
Sodium: 139 mmol/L (ref 134–144)
Total Protein: 6.8 g/dL (ref 6.0–8.5)
eGFR: 113 mL/min/{1.73_m2} (ref >59)

## 2022-09-29 LAB — LIPID PANEL
Chol/HDL Ratio: 6.9 ratio — ABNORMAL HIGH (ref 0.0–4.4)
Cholesterol, Total: 247 mg/dL — ABNORMAL HIGH (ref 100–199)
HDL: 36 mg/dL — ABNORMAL LOW (ref >39)
LDL Chol Calc (NIH): 153 mg/dL — ABNORMAL HIGH (ref 0–99)
Triglycerides: 313 mg/dL — ABNORMAL HIGH (ref 0–149)
VLDL Cholesterol Cal: 58 mg/dL — ABNORMAL HIGH (ref 5–40)

## 2022-09-29 LAB — INSULIN, RANDOM: INSULIN: 21.7 u[IU]/mL (ref 2.6–24.9)

## 2022-09-29 LAB — VITAMIN B12: Vitamin B-12: 602 pg/mL (ref 232–1245)

## 2022-09-29 NOTE — Telephone Encounter (Signed)
Prior authorization denied for patients Darlene Mccormick. Patient and Dr. Cathey Endow have been notified.

## 2022-10-22 ENCOUNTER — Ambulatory Visit (INDEPENDENT_AMBULATORY_CARE_PROVIDER_SITE_OTHER): Payer: 59 | Admitting: Family Medicine

## 2022-10-23 ENCOUNTER — Other Ambulatory Visit (INDEPENDENT_AMBULATORY_CARE_PROVIDER_SITE_OTHER): Payer: Self-pay | Admitting: Family Medicine

## 2022-10-23 DIAGNOSIS — R7303 Prediabetes: Secondary | ICD-10-CM

## 2022-10-30 ENCOUNTER — Ambulatory Visit: Payer: 59 | Admitting: Cardiovascular Disease

## 2022-10-30 ENCOUNTER — Ambulatory Visit: Payer: 59 | Attending: Cardiovascular Disease | Admitting: Cardiology

## 2022-10-30 ENCOUNTER — Telehealth: Payer: Self-pay | Admitting: *Deleted

## 2022-10-30 ENCOUNTER — Encounter: Payer: Self-pay | Admitting: Cardiology

## 2022-10-30 VITALS — BP 139/90 | HR 82 | Ht 68.0 in | Wt 294.4 lb

## 2022-10-30 DIAGNOSIS — E782 Mixed hyperlipidemia: Secondary | ICD-10-CM

## 2022-10-30 DIAGNOSIS — Z79899 Other long term (current) drug therapy: Secondary | ICD-10-CM | POA: Diagnosis not present

## 2022-10-30 DIAGNOSIS — R079 Chest pain, unspecified: Secondary | ICD-10-CM | POA: Diagnosis not present

## 2022-10-30 NOTE — Addendum Note (Signed)
Addended by: Luellen Pucker on: 10/30/2022 01:58 PM   Modules accepted: Orders

## 2022-10-30 NOTE — Patient Instructions (Addendum)
Medication Instructions:  Your physician recommends that you continue on your current medications as directed. Please refer to the Current Medication list given to you today.    Lab Work: Please complete an NMR profile in our lab. Make an appointment to complete this lab work when you have been fasting for 8 hours.   If you have labs (blood work) drawn today and your tests are completely normal, you will receive your results only by: MyChart Message (if you have MyChart) OR A paper copy in the mail If you have any lab test that is abnormal or we need to change your treatment, we will call you to review the results.   Testing/Procedures: Your physician has ordered a coronary calcium score CT for you. This is a test that help predict the risk of coronary artery disease. Patient cost is usually $94-99.   Your physician has recommended that you have a sleep study. This test records several body functions during sleep, including: brain activity, eye movement, oxygen and carbon dioxide blood levels, heart rate and rhythm, breathing rate and rhythm, the flow of air through your mouth and nose, snoring, body muscle movements, and chest and belly movement.    Follow-Up: At [facility], you and your health needs are our priority.  As part of our continuing mission to provide you with exceptional heart care, we have created designated Provider Care Teams.  These Care Teams include your primary Cardiologist (physician) and Advanced Practice Providers (APPs -  Physician Assistants and Nurse Practitioners) who all work together to provide you with the care you need, when you need it.  We recommend signing up for the patient portal called "MyChart".  Sign up information is provided on this After Visit Summary.  MyChart is used to connect with patients for Virtual Visits (Telemedicine).  Patients are able to view lab/test results, encounter notes, upcoming appointments, etc.  Non-urgent messages  can be sent to your provider as well.   To learn more about what you can do with MyChart, go to [email or web address].    Your next appointment will be dependent on the results of your testing and it will be with:     Provider:   Dr. Armanda Magic, MD   Other Instructions Please check your BP twice a day for a week and document these readings. Ideally, you should check your BP 2-3 hours after you have taken any blood pressure medication. Once you have completed your BP log, call our office at [phone] to submit your readings. Or, you may upload them on Mychart.

## 2022-10-30 NOTE — Progress Notes (Addendum)
Cardiology CONSULT Note    Date:  10/30/2022   ID:  Jonell Brumbaugh, DOB [birth year], MRN [id number]  PCP:  Karie Georges, MD  Cardiologist:  Armanda Magic, MD   Chief Complaint  Patient presents with   New Patient (Initial Visit)    Chest Pain    Patient Profile: Darlene Mccormick is a 47 y.o. female who is being seen today for the evaluation of chest pain at the request of Karie Georges, MD.  History of Present Illness:  Darlene Mccormick is a 47 y.o. female who is being seen today for the evaluation of chest pain at the request of Karie Georges, MD.  This is a 47 year old female with a history of anxiety, depression, hyperlipidemia, prediabetes who is referred for evaluation of chest pain.  She was seen in the ER 07/2022 with CP described as a pressure with paresthesias in the fingers of her left hand and left jaw along with some mild SOB.  It was nonexertional.  This started after she started a new low dose birth control for perimenopausal sx.  She also has felt "spacey" and was having  palpitations.  hsTrop was normal at 4 and 4. EKG was nonischemic.  Her BP was also elevated. She saw her GYN the next day she was instructed to come off the estrogen and she did and all of there sx resolved.   Since she came off estrogen she has not had any further CP or pressure.  She denies any SOB, DOE, dizziness, palpitations, dizziness or syncope.   Past Medical History:  Diagnosis Date   Anxiety    B12 deficiency    Depression    Elevated liver enzymes    Fatty liver    Gallstones    High triglycerides    Hyperlipidemia    Hypertension    not recently   Joint pain    Obesity    Pre-diabetes    Uterine fibroid    Vitamin D deficiency     Past Surgical History:  Procedure Laterality Date   APPENDECTOMY  1997   MYOMECTOMY N/A 02/16/2018   Procedure: ABDOMINAL MYOMECTOMY;  Surgeon: Geryl Rankins, MD;  Location: [facility];  Service: Gynecology;   Laterality: N/A;   TONSILLECTOMY AND ADENOIDECTOMY  1988   TRACHEOSTOMY  1979   done during epiglotitis   UTERINE FIBROID SURGERY      Current Medications: Current Meds  Medication Sig   Magnesium 500 MG CAPS Take 500 mg by mouth daily.   metFORMIN (GLUCOPHAGE-XR) 500 MG 24 hr tablet Take 1 tablet (500 mg total) by mouth daily with breakfast.   Multiple Vitamin (MULTIVITAMIN WITH MINERALS) TABS tablet Take 1 tablet by mouth daily.   Vitamin D, Ergocalciferol, (DRISDOL) 1.25 MG (50000 UNIT) CAPS capsule Take 1 capsule (50,000 Units total) by mouth every 7 (seven) days.    Allergies:   Patient has no known allergies.   Social History   Socioeconomic History   Marital status: Married    Spouse name: John   Number of children: Not on file   Years of education: Not on file   Highest education level: Not on file  Occupational History   Occupation: Librarian, academic of [employer]   Occupation: Warehouse manager  Tobacco Use   Smoking status: Never   Smokeless tobacco: Never  Vaping Use   Vaping status: Never Used  Substance and Sexual Activity   Alcohol use: Yes  Alcohol/week: 1.0 standard drink of alcohol    Types: 1 Glasses of wine per week    Comment: occ   Drug use: No   Sexual activity: Yes    Partners: Male  Other Topics Concern   Not on file  Social History Narrative   Not on file   Social Determinants of Health   Financial Resource Strain: Not on file  Food Insecurity: Not on file  Transportation Needs: Not on file  Physical Activity: Not on file  Stress: Not on file  Social Connections: Not on file     Family History:  The patient's family history includes Alcohol abuse in her maternal grandfather; Anxiety disorder in her father; Breast cancer in her maternal grandmother and another family member; Diabetes in her mother; Heart attack (age of onset: 57) in her paternal grandmother; High Cholesterol in her mother; High blood  pressure in her brother; Hyperlipidemia in her father; Hypertension in her father; Obesity in her father and mother; Other in her mother; Sleep apnea in her father.   ROS:   Please see the history of present illness.    ROS All other systems reviewed and are negative.      No data to display             PHYSICAL EXAM:   VS:  BP (!) 139/90   Pulse 82   Ht 5\' 8"  (1.727 m)   Wt 294 lb 6.4 oz (133.5 kg)   SpO2 93%   BMI 44.76 kg/m    GEN: Well nourished, well developed, in no acute distress  HEENT: normal  Neck: no JVD, carotid bruits, or masses Cardiac: RRR; no murmurs, rubs, or gallops,no edema.  Intact distal pulses bilaterally.  Respiratory:  clear to auscultation bilaterally, normal work of breathing GI: soft, nontender, nondistended, + BS MS: no deformity or atrophy  Skin: warm and dry, no rash Neuro:  Alert and Oriented x 3, Strength and sensation are intact Psych: euthymic mood, full affect  Wt Readings from Last 3 Encounters:  10/30/22 294 lb 6.4 oz (133.5 kg)  09/28/22 287 lb (130.2 kg)  09/09/22 289 lb (131.1 kg)      Studies/Labs Reviewed:      Recent Labs: 04/28/2022: TSH 1.420 08/20/2022: Hemoglobin 12.9; Platelets 387 09/28/2022: ALT 104; BUN 7; Creatinine, Ser 0.60; Potassium 4.9; Sodium 139   Lipid Panel    Component Value Date/Time   CHOL 247 (H) 09/28/2022 1134   CHOL 246 (H) 01/05/2019 0933   TRIG 313 (H) 09/28/2022 1134   TRIG 296 (H) 01/05/2019 0933   HDL 36 (L) 09/28/2022 1134   CHOLHDL 6.9 (H) 09/28/2022 1134   CHOLHDL 6 12/27/2020 1004   VLDL 50.0 (H) 12/27/2020 1004   LDLCALC 153 (H) 09/28/2022 1134   LDLCALC 193 (H) 11/25/2018 1600   LDLDIRECT 170.0 12/27/2020 1004     ASSESSMENT:    1. Chest pain of uncertain etiology      PLAN:  In order of problems listed above:  Chest pain  -likely related to elevated BP in the setting of newly started estrogen for perimenopausal sx -she stopped the estrogen and all of her symptoms  have resolved.  -she has a fm hx of CAD>>her PGM died of an MI in her 85's and MGF had TIAs -since she has not had any further CP I will get a coronary Ca score to assess future cardiac risk  2.  HLD -I have personally reviewed and interpreted outside labs performed  by patient's PCP which showed LDL 153 and HDL 36 on 09/28/2022 -I will check an NMR lipid panel along with a Lp(a) -if she has coronary Ca then LDL goal will be <70  3.  Morbid Obesity -she wakes up some during the night -StopBang Score 4 -Check Itamar HST to rule out OSA  4.  Elevated Blood Pressure with no hx of HTN -DBP is elevated at today -I have asked her to check her BP twice daily for a week and send results  5.  Perimenopause -I recommended that she talk to her GYN about other possible drugs to help with perimenopausal sx other than estrogen (consider an SSRI like Paxil to help with sleep and hot flashes)  Time Spent: 20 minutes total time of encounter, including 15 minutes spent in face-to-face patient care on the date of this encounter. This time includes coordination of care and counseling regarding above mentioned problem list. Remainder of non-face-to-face time involved reviewing chart documents/testing relevant to the patient encounter and documentation in the medical record. I have independently reviewed documentation from referring provider  Followup:  PRN  Medication Adjustments/Labs and Tests Ordered: Current medicines are reviewed at length with the patient today.  Concerns regarding medicines are outlined above.  Medication changes, Labs and Tests ordered today are listed in the Patient Instructions below.  There are no Patient Instructions on file for this visit.   Signed, Armanda Magic, MD  10/30/2022 1:35 PM    [facility] [address] [place], [place], [place]  [address] Phone: [phone]; Fax: [phone]

## 2022-10-30 NOTE — Telephone Encounter (Signed)
Itamar ordered by Dr. Mayford Knife.  Patient given instructions and understands not to open box until called with PIN number

## 2022-11-02 ENCOUNTER — Ambulatory Visit (INDEPENDENT_AMBULATORY_CARE_PROVIDER_SITE_OTHER): Payer: 59 | Admitting: Family Medicine

## 2022-11-02 ENCOUNTER — Encounter (INDEPENDENT_AMBULATORY_CARE_PROVIDER_SITE_OTHER): Payer: Self-pay | Admitting: Family Medicine

## 2022-11-02 VITALS — BP 128/88 | HR 73 | Temp 98.1°F | Ht 68.0 in | Wt 291.0 lb

## 2022-11-02 DIAGNOSIS — E559 Vitamin D deficiency, unspecified: Secondary | ICD-10-CM | POA: Diagnosis not present

## 2022-11-02 DIAGNOSIS — R7303 Prediabetes: Secondary | ICD-10-CM | POA: Diagnosis not present

## 2022-11-02 DIAGNOSIS — Z6841 Body Mass Index (BMI) 40.0 and over, adult: Secondary | ICD-10-CM

## 2022-11-02 DIAGNOSIS — K760 Fatty (change of) liver, not elsewhere classified: Secondary | ICD-10-CM | POA: Diagnosis not present

## 2022-11-02 DIAGNOSIS — E88819 Insulin resistance, unspecified: Secondary | ICD-10-CM

## 2022-11-02 MED ORDER — METFORMIN HCL ER 500 MG PO TB24: 500.0000 mg | ORAL_TABLET | Freq: Every day | ORAL | 0 refills | Status: DC

## 2022-11-02 MED ORDER — VITAMIN D (ERGOCALCIFEROL) 1.25 MG (50000 UNIT) PO CAPS
50000.0000 [IU] | ORAL_CAPSULE | ORAL | 0 refills | Status: DC
Start: 2022-11-02 — End: 2022-12-10

## 2022-11-02 NOTE — Assessment & Plan Note (Signed)
Fasting insulin remains elevated at 21.1.  Patient did start metformin XR 500 mg once daily at her last visit.  She is tolerating this well with only occasional diarrhea.  She is working on reducing her intake of sugar and starches.  She has room for improvement with regular exercise.  Unfortunately, her insurance will not cover use of a GLP-1 receptor agonist for insulin resistance or obesity.  Continue metformin 500 mg XR once daily.  Repeat chemistry panel, B12 level and fasting insulin in the next 1 to 2 months. Continue prescribed meal plan.  Work on increasing walking time.

## 2022-11-02 NOTE — Assessment & Plan Note (Signed)
Last vitamin D Lab Results  Component Value Date   VD25OH 35.7 09/09/2022   Patient is taking vitamin D 50,000 IU once weekly.  She denies adverse side effects.  Energy level is improving.  Recheck vitamin D level in the next 2 months

## 2022-11-02 NOTE — Assessment & Plan Note (Signed)
>>  ASSESSMENT AND PLAN FOR PREDIABETES WRITTEN ON 11/02/2022  1:23 PM BY BOWEN, KAREN E, DO  Lab Results  Component Value Date   HGBA1C 6.2 (H) 09/09/2022   Labs reviewed from June.  Her A1c remains in the prediabetic range at 6.2 and she has started on metformin  XR 500 mg once daily for the past 3 weeks.  She is tolerating this well with occasional loose stools.  She is working on reducing her intake of high sugar foods and drinks.  She has room for improvement with regular exercise.  Continue current lifestyle change plans.  Work on increasing walking time and continue weight reduction.

## 2022-11-02 NOTE — Addendum Note (Signed)
Addended by: Glennis Brink on: 11/02/2022 01:24 PM   Modules accepted: Level of Service

## 2022-11-02 NOTE — Assessment & Plan Note (Signed)
Lab Results  Component Value Date   ALT 104 (H) 09/28/2022    Reviewed labs from last visit.  Liver enzymes remain mildly elevated.  Patient has a known diagnosis of hepatic steatosis.  She denies recent intake of alcohol or daily Tylenol use. She is actively working on weight reduction but has failed to see weight loss in the past 1-1/2 years of medically supervised weight management. Consider referral to GI for NASH FibroSure scan.

## 2022-11-02 NOTE — Telephone Encounter (Signed)
Prior Authorization for [facility] sent to [facility] via web portal. Tracking Number .

## 2022-11-02 NOTE — Progress Notes (Signed)
Office: [phone]  /  Fax: [phone]  WEIGHT SUMMARY AND BIOMETRICS  Starting Date: 05/13/21  Starting Weight: 282lb   Weight Lost Since Last Visit: 0lb   Vitals Temp: 98.1 F (36.7 C) BP: 128/88 Pulse Rate: 73 SpO2: 95 %   Body Composition  Body Fat %: 51.3 % Fat Mass (lbs): 149.6 lbs Muscle Mass (lbs): 134.8 lbs Total Body Water (lbs): 111.8 lbs Visceral Fat Rating : 16     HPI  Chief Complaint: OBESITY  Darlene Mccormick is here to discuss her progress with her obesity treatment plan. She is on the keeping a food journal and adhering to recommended goals of 1800 calories and 90 protein and states she is following her eating plan approximately 60-70 % of the time. She states she is walking 30 minutes 3-4 times per week.   Interval History:  Since last office visit she is up 4 lb This gives her a net weight gain of 9 pounds in the past 1-1/2 years of medically supervised weight management Ozempic was denied by insurance but she did get started on Metformin XR 500 mg daily She is tolerating metformin XR well She is eating a bigger breakfast and struggles with dinner planning during the workweek Denies over snacking or craving sweets She avoids SSBs She has not been walking much with the heat of the summer  Pharmacotherapy: Metformin XR 500 mg once daily  PHYSICAL EXAM:  Blood pressure 128/88, pulse 73, temperature 98.1 F (36.7 C), height 5\' 8"  (1.727 m), weight 291 lb (132 kg), SpO2 95%. Body mass index is 44.25 kg/m.  General: She is overweight, cooperative, alert, well developed, and in no acute distress. PSYCH: Has normal mood, affect and thought process.   Lungs: Normal breathing effort, no conversational dyspnea.   ASSESSMENT AND PLAN  TREATMENT PLAN FOR OBESITY:  Recommended Dietary Goals  [name] is currently in the action stage of change. As such, her goal is to continue weight management plan. She has agreed to keeping a food journal and adhering  to recommended goals of 1800 calories and 90+ grams of protein. -We discussed easy dinner options to incorporate more lean protein and fiber -We discussed premade meals such as factor or clean eats for dinner  Behavioral Intervention  We discussed the following Behavioral Modification Strategies today: increasing lean protein intake, decreasing simple carbohydrates , increasing vegetables, increasing lower glycemic fruits, increasing fiber rich foods, avoiding skipping meals, increasing water intake, work on meal planning and preparation, keeping healthy foods at home, continue to work on implementation of reduced calorie nutritional plan, continue to practice mindfulness when eating, and planning for success.  Additional resources provided today: NA  Recommended Physical Activity Goals  [name] has been advised to work up to 150 minutes of moderate intensity aerobic activity a week and strengthening exercises 2-3 times per week for cardiovascular health, weight loss maintenance and preservation of muscle mass.   She has agreed to Start aerobic activity with a goal of 150 minutes a week at moderate intensity.   Pharmacotherapy changes for the treatment of obesity: None  ASSOCIATED CONDITIONS ADDRESSED TODAY  Prediabetes Assessment & Plan: Lab Results  Component Value Date   HGBA1C 6.2 (H) 09/09/2022   Labs reviewed from June.  Her A1c remains in the prediabetic range at 6.2 and she has started on metformin XR 500 mg once daily for the past 3 weeks.  She is tolerating this well with occasional loose stools.  She is working on reducing her intake  of high sugar foods and drinks.  She has room for improvement with regular exercise.  Continue current lifestyle change plans.  Work on increasing walking time and continue weight reduction.  Orders: -     metFORMIN HCl ER; Take 1 tablet (500 mg total) by mouth daily with breakfast.  Dispense: 30 tablet; Refill: 0  Vitamin D  deficiency Assessment & Plan: Last vitamin D Lab Results  Component Value Date   VD25OH 35.7 09/09/2022   Patient is taking vitamin D 50,000 IU once weekly.  She denies adverse side effects.  Energy level is improving.  Recheck vitamin D level in the next 2 months  Orders: -     Vitamin D (Ergocalciferol); Take 1 capsule (50,000 Units total) by mouth every 7 (seven) days.  Dispense: 5 capsule; Refill: 0  Class 3 severe obesity due to excess calories with serious comorbidity and body mass index (BMI) of 40.0 to 44.9 in adult [facility])  Insulin resistance Assessment & Plan: Fasting insulin remains elevated at 21.1.  Patient did start metformin XR 500 mg once daily at her last visit.  She is tolerating this well with only occasional diarrhea.  She is working on reducing her intake of sugar and starches.  She has room for improvement with regular exercise.  Unfortunately, her insurance will not cover use of a GLP-1 receptor agonist for insulin resistance or obesity.  Continue metformin 500 mg XR once daily.  Repeat chemistry panel, B12 level and fasting insulin in the next 1 to 2 months. Continue prescribed meal plan.  Work on increasing walking time.   NAFLD (nonalcoholic fatty liver disease) Assessment & Plan: Lab Results  Component Value Date   ALT 104 (H) 09/28/2022    Reviewed labs from last visit.  Liver enzymes remain mildly elevated.  Patient has a known diagnosis of hepatic steatosis.  She denies recent intake of alcohol or daily Tylenol use. She is actively working on weight reduction but has failed to see weight loss in the past 1-1/2 years of medically supervised weight management. Consider referral to GI for NASH FibroSure scan.       She was informed of the importance of frequent follow up visits to maximize her success with intensive lifestyle modifications for her multiple health conditions.   ATTESTASTION STATEMENTS:  Reviewed by clinician on day of visit:  allergies, medications, problem list, medical history, surgical history, family history, social history, and previous encounter notes pertinent to obesity diagnosis.   I have personally spent 30 minutes total time today in preparation, patient care, nutritional counseling and documentation for this visit, including the following: review of clinical lab tests; review of medical tests/procedures/services.      Glennis Brink, DO DABFM, DABOM [facility] [address] [place], [place] [address] [phone]

## 2022-11-02 NOTE — Assessment & Plan Note (Signed)
Lab Results  Component Value Date   HGBA1C 6.2 (H) 09/09/2022   Labs reviewed from June.  Her A1c remains in the prediabetic range at 6.2 and she has started on metformin XR 500 mg once daily for the past 3 weeks.  She is tolerating this well with occasional loose stools.  She is working on reducing her intake of high sugar foods and drinks.  She has room for improvement with regular exercise.  Continue current lifestyle change plans.  Work on increasing walking time and continue weight reduction.

## 2022-11-03 ENCOUNTER — Other Ambulatory Visit (INDEPENDENT_AMBULATORY_CARE_PROVIDER_SITE_OTHER): Payer: Self-pay | Admitting: Family Medicine

## 2022-11-03 DIAGNOSIS — E559 Vitamin D deficiency, unspecified: Secondary | ICD-10-CM

## 2022-11-04 ENCOUNTER — Ambulatory Visit (HOSPITAL_COMMUNITY)
Admission: RE | Admit: 2022-11-04 | Discharge: 2022-11-04 | Disposition: A | Discharge status: Home / Self Care | Payer: 59 | Source: Ambulatory Visit | Attending: Cardiovascular Disease | Admitting: Cardiovascular Disease

## 2022-11-04 ENCOUNTER — Ambulatory Visit: Payer: 59

## 2022-11-04 DIAGNOSIS — Z79899 Other long term (current) drug therapy: Secondary | ICD-10-CM

## 2022-11-04 DIAGNOSIS — E782 Mixed hyperlipidemia: Secondary | ICD-10-CM

## 2022-11-04 DIAGNOSIS — R079 Chest pain, unspecified: Secondary | ICD-10-CM | POA: Insufficient documentation

## 2022-11-05 NOTE — Telephone Encounter (Signed)
Prior Authorization for [facility] sent to [facility] via web portal. Tracking Number . READY- Prior Authorization is not required for the requested services. Decision ID #: [id number]

## 2022-11-05 NOTE — Telephone Encounter (Signed)
Left detailed message Itamar study approved. Left PIN# [id number] on vm. Left message to call back or send my chart to confirm message received.

## 2022-11-09 ENCOUNTER — Encounter (INDEPENDENT_AMBULATORY_CARE_PROVIDER_SITE_OTHER): Payer: Self-pay

## 2022-11-17 ENCOUNTER — Telehealth: Payer: Self-pay

## 2022-11-17 NOTE — Telephone Encounter (Signed)
Call to discuss CT results, no answer, left detailed message per DPR letting patient know calcium score was 0 and to call our office if any questions.

## 2022-11-17 NOTE — Telephone Encounter (Signed)
-----   Message from Armanda Magic sent at 11/06/2022  8:10 AM EDT ----- Coronary Ca score is 0

## 2022-12-01 ENCOUNTER — Other Ambulatory Visit (INDEPENDENT_AMBULATORY_CARE_PROVIDER_SITE_OTHER): Payer: Self-pay | Admitting: Family Medicine

## 2022-12-01 DIAGNOSIS — R7303 Prediabetes: Secondary | ICD-10-CM

## 2022-12-10 ENCOUNTER — Ambulatory Visit (INDEPENDENT_AMBULATORY_CARE_PROVIDER_SITE_OTHER): Payer: 59 | Admitting: Family Medicine

## 2022-12-10 ENCOUNTER — Other Ambulatory Visit (INDEPENDENT_AMBULATORY_CARE_PROVIDER_SITE_OTHER): Payer: Self-pay | Admitting: Family Medicine

## 2022-12-10 ENCOUNTER — Encounter (INDEPENDENT_AMBULATORY_CARE_PROVIDER_SITE_OTHER): Payer: Self-pay | Admitting: Family Medicine

## 2022-12-10 VITALS — BP 132/90 | HR 79 | Temp 98.0°F | Ht 68.0 in | Wt 289.0 lb

## 2022-12-10 DIAGNOSIS — K760 Fatty (change of) liver, not elsewhere classified: Secondary | ICD-10-CM

## 2022-12-10 DIAGNOSIS — Z6841 Body Mass Index (BMI) 40.0 and over, adult: Secondary | ICD-10-CM

## 2022-12-10 DIAGNOSIS — R7303 Prediabetes: Secondary | ICD-10-CM

## 2022-12-10 DIAGNOSIS — E559 Vitamin D deficiency, unspecified: Secondary | ICD-10-CM

## 2022-12-10 MED ORDER — VITAMIN D (ERGOCALCIFEROL) 1.25 MG (50000 UNIT) PO CAPS
50000.0000 [IU] | ORAL_CAPSULE | ORAL | 0 refills | Status: DC
Start: 2022-12-10 — End: 2023-04-22

## 2022-12-10 MED ORDER — METFORMIN HCL ER 500 MG PO TB24: 500.0000 mg | ORAL_TABLET | Freq: Every day | ORAL | 0 refills | Status: DC

## 2022-12-10 NOTE — Progress Notes (Signed)
Darlene Mccormick, D.O.  ABFM, ABOM Specializing in Clinical Bariatric Medicine  Office located at: [address] [place], [place]  [address]     Assessment and Plan:   Medications Discontinued During This Encounter  Medication Reason   metFORMIN (GLUCOPHAGE-XR) 500 MG 24 hr tablet Reorder   Vitamin D, Ergocalciferol, (DRISDOL) 1.25 MG (50000 UNIT) CAPS capsule Reorder     Meds ordered this encounter  Medications   Vitamin D, Ergocalciferol, (DRISDOL) 1.25 MG (50000 UNIT) CAPS capsule    Sig: Take 1 capsule (50,000 Units total) by mouth every 7 (seven) days.    Dispense:  8 capsule    Refill:  0   metFORMIN (GLUCOPHAGE-XR) 500 MG 24 hr tablet    Sig: Take 1 tablet (500 mg total) by mouth daily with breakfast.    Dispense:  60 tablet    Refill:  0    Recheck non-fasting labs next OV   Prediabetes Assessment & Plan: Lab Results  Component Value Date   HGBA1C 6.2 (H) 09/09/2022   HGBA1C 6.2 (H) 04/28/2022   HGBA1C 6.1 (H) 11/03/2021   INSULIN 21.7 09/28/2022   INSULIN 21.1 04/28/2022   INSULIN 23.8 11/03/2021    Prediabetes treated with Metformin XR 500 mg daily. Reports taking her Metformin ~75% of the time. Reports either eating little for or skipping lunch and overall being under in her protein intake.  Extension discussion had about the importance of not skipping meals & making oneself a priority for the best weight loss results. Will refill Metformin today - no change in dose, pt encouraged to take consistently to decrease risk of progressing to diabetes.    NAFLD (nonalcoholic fatty liver disease) Assessment & Plan: Lab Results  Component Value Date   ALT 104 (H) 09/28/2022   AST 73 (H) 09/28/2022   ALKPHOS 67 09/28/2022   BILITOT 0.9 09/28/2022   Her liver enzymes are elevated with known fatty liver disease.  Treatment includes weight loss, elimination of sweet drinks, including juice, avoidance of high fructose corn syrup, and exercise. As always,  avoiding alcohol consumption is important. Treatment goal: 7-10% reduction of body weight. Will recheck liver enzymes next OV.   Vitamin D deficiency Assessment & Plan: Lab Results  Component Value Date   VD25OH 35.7 09/09/2022   VD25OH 34.2 04/28/2022   VD25OH 46.0 11/03/2021   Pt endorses taking ERGO 50K international units once a week about ~70% of the time. Will refill ERGO today - maintain with current dose, pt encouraged to take consistently. Will recheck levels in 1 mo or so.    Class 3 severe obesity due to excess calories with serious comorbidity and body mass index (BMI) of 40.0 to 44.9 in adult [facility] Assessment & Plan: Since last office visit on 11/02/22 patient's muscle mass has increased by 0.2 lb. Fat mass has decreased by 2.2 lb. Total body water has decreased by 1.6 lb.  Counseling done on how various foods will affect these numbers and how to maximize success  Total lbs lost to date: + 7 lbs  Total weight loss percentage to date: + 2.48%   No change to meal plan - see Subjective   Behavioral Intervention Additional resources provided today:  NA Evidence-based interventions for health behavior change were utilized today including the discussion of self monitoring techniques, problem-solving barriers and SMART goal setting techniques.   Regarding patient's less desirable eating habits and patterns, we employed the technique of small changes.  Pt will specifically  work on: eating everything on plan for lunch for next visit.    FOLLOW UP: Return 4-6 wks. She was informed of the importance of frequent follow up visits to maximize her success with intensive lifestyle modifications for her multiple health conditions.  Subjective:   Chief complaint: Obesity Darlene Mccormick is here to discuss her progress with her obesity treatment plan. She is keeping a food journal and adhering to recommended goals of 1800 calories and 90+ protein and states she is following her eating plan  approximately 60-70% of the time. She states she is walking 30 minutes 4  days per week.  Interval History:  Darlene Mccormick is here for a follow up office visit. This is my first time meeting with Darlene Mccormick; she typically sees Dr.Bowen. Reports doing well with exercise- she walks her dog every day and walks a lot [place]. Her biggest challenges are not eating all her protein and skipping meals due to busy schedule. She specifically notes that lunch is a challenge to get in and that sometimes her lunch will be a single granola bar. Pt also desires to discuss labs ordered by cardiologist Dr.Nasser on 8/7; I recommended her to speak with him regarding those results.   Pharmacotherapy for weight loss: She is currently taking  Metformin XR 500 mg daily  for medical weight loss.  Denies side effects.    Review of Systems:  Pertinent positives were addressed with patient today.  Reviewed by clinician on day of visit: allergies, medications, problem list, medical history, surgical history, family history, social history, and previous encounter notes.  Weight Summary and Biometrics   Weight Lost Since Last Visit: 2 lb  Weight Gained Since Last Visit: 0    Vitals Temp: 98 F (36.7 C) BP: (!) 132/90 Pulse Rate: 79 SpO2: 97 %   Anthropometric Measurements Height: 5\' 8"  (1.727 m) Weight: 289 lb (131.1 kg) BMI (Calculated): 43.95 Weight at Last Visit: 291 lb Weight Lost Since Last Visit: 2 lb Weight Gained Since Last Visit: 0 Starting Weight: 282 lb Total Weight Loss (lbs): 0 lb (0 kg)   Body Composition  Body Fat %: 50.9 % Fat Mass (lbs): 147.4 lbs Muscle Mass (lbs): 135 lbs Total Body Water (lbs): 110.2 lbs Visceral Fat Rating : 16   Other Clinical Data Fasting: No Labs: No Today's Visit #: 13 Starting Date: 05/13/21   Objective:   PHYSICAL EXAM: Blood pressure (!) 132/90, pulse 79, temperature 98 F (36.7 C), height 5\' 8"  (1.727 m), weight 289 lb (131.1  kg), SpO2 97%. Body mass index is 43.94 kg/m.  General: Well Developed, well nourished, and in no acute distress.  HEENT: Normocephalic, atraumatic Skin: Warm and dry, cap RF less 2 sec, good turgor Chest:  Normal excursion, shape, no gross abn Respiratory: speaking in full sentences, no conversational dyspnea NeuroM-Sk: Ambulates w/o assistance, moves * 4 Psych: A and O *3, insight good, mood-full  DIAGNOSTIC DATA REVIEWED:  BMET    Component Value Date/Time   NA 139 09/28/2022 1134   K 4.9 09/28/2022 1134   CL 101 09/28/2022 1134   CO2 23 09/28/2022 1134   GLUCOSE 98 09/28/2022 1134   GLUCOSE 115 (H) 08/20/2022 2325   BUN 7 09/28/2022 1134   CREATININE 0.60 09/28/2022 1134   CREATININE 0.66 11/25/2018 1600   CALCIUM 8.7 09/28/2022 1134   GFRNONAA >60 08/20/2022 2325   GFRAA 119 03/06/2020 0904   Lab Results  Component Value Date   HGBA1C 6.2 (H)  09/09/2022   HGBA1C 5.6 11/26/2017   Lab Results  Component Value Date   INSULIN 21.7 09/28/2022   INSULIN 16.2 05/03/2019   Lab Results  Component Value Date   TSH 1.420 04/28/2022   CBC    Component Value Date/Time   WBC 8.9 08/20/2022 2325   RBC 4.51 08/20/2022 2325   HGB 12.9 08/20/2022 2325   HGB 12.6 03/06/2020 0904   HCT 39.2 08/20/2022 2325   HCT 37.4 03/06/2020 0904   PLT 387 08/20/2022 2325   PLT 358 03/06/2020 0904   MCV 86.9 08/20/2022 2325   MCV 87 03/06/2020 0904   MCH 28.6 08/20/2022 2325   MCHC 32.9 08/20/2022 2325   RDW 12.7 08/20/2022 2325   RDW 12.7 03/06/2020 0904   Iron Studies    Component Value Date/Time   IRON 62 01/05/2019 0933   FERRITIN 57.7 01/05/2019 0933   Lipid Panel     Component Value Date/Time   CHOL 247 (H) 09/28/2022 1134   CHOL 246 (H) 01/05/2019 0933   TRIG 313 (H) 09/28/2022 1134   TRIG 296 (H) 01/05/2019 0933   HDL 36 (L) 09/28/2022 1134   CHOLHDL 6.9 (H) 09/28/2022 1134   CHOLHDL 6 12/27/2020 1004   VLDL 50.0 (H) 12/27/2020 1004   LDLCALC 153 (H)  09/28/2022 1134   LDLCALC 193 (H) 11/25/2018 1600   LDLDIRECT 170.0 12/27/2020 1004   Hepatic Function Panel     Component Value Date/Time   PROT 6.8 09/28/2022 1134   ALBUMIN 4.5 09/28/2022 1134   AST 73 (H) 09/28/2022 1134   ALT 104 (H) 09/28/2022 1134   ALKPHOS 67 09/28/2022 1134   BILITOT 0.9 09/28/2022 1134   BILIDIR <0.1 08/21/2022 0208   IBILI NOT CALCULATED 08/21/2022 0208      Component Value Date/Time   TSH 1.420 04/28/2022 0958   TSH 1.41 12/27/2020 1004   Nutritional Lab Results  Component Value Date   VD25OH 35.7 09/09/2022   VD25OH 34.2 04/28/2022   VD25OH 46.0 11/03/2021    Attestations:   Patient was in the office today and time spent on visit including pre-visit chart review and post-visit care/coordination of care and electronic medical record documentation was 40 minutes. 50% of the time was in face to face counseling of this patient's medical condition(s) and providing education on treatment options to include the first-line treatment of diet and lifestyle modification.   I, Special Randolm Idol, acting as a Stage manager for [employer], DO., have compiled all relevant documentation for today's office visit on behalf of Thomasene Lot, DO, while in the presence of [employer], DO.  I have reviewed the above documentation for accuracy and completeness, and I agree with the above. Darlene Mccormick, D.O.  The 21st Century Cures Act was signed into law in 2016 which includes the topic of electronic health records.  This provides immediate access to information in MyChart.  This includes consultation notes, operative notes, office notes, lab results and pathology reports.  If you have any questions about what you read please let us know at your next visit so we can discuss your concerns and take corrective action if need be.  We are right here with you.

## 2022-12-15 ENCOUNTER — Ambulatory Visit: Payer: 59 | Admitting: Nurse Practitioner

## 2022-12-15 ENCOUNTER — Encounter: Payer: Self-pay | Admitting: Nurse Practitioner

## 2022-12-15 VITALS — BP 126/88 | HR 75 | Temp 98.0°F | Ht 68.0 in | Wt 295.4 lb

## 2022-12-15 DIAGNOSIS — J069 Acute upper respiratory infection, unspecified: Secondary | ICD-10-CM | POA: Diagnosis not present

## 2022-12-15 LAB — POC COVID19 BINAXNOW: SARS Coronavirus 2 Ag: NEGATIVE

## 2022-12-15 MED ORDER — PROMETHAZINE-DM 6.25-15 MG/5ML PO SYRP
5.0000 mL | ORAL_SOLUTION | Freq: Four times a day (QID) | ORAL | 0 refills | Status: DC | PRN
Start: 2022-12-15 — End: 2023-01-15

## 2022-12-15 NOTE — Progress Notes (Signed)
Acute Office Visit  Subjective:    Patient ID: Darlene Mccormick, female    DOB: 10-30-1975, 47 y.o.   MRN: [id number]  Chief Complaint  Patient presents with   Sinusitis    head congestion, head ache, body aches, sinus drainage, cough, fatigue, for 3 days home covid test was negative, flu vaccine   URI  This is a new problem. The current episode started in the past 7 days. The problem has been unchanged. There has been no fever. Associated symptoms include congestion, coughing, ear pain, headaches, joint pain, rhinorrhea and sinus pain. Pertinent negatives include no abdominal pain, chest pain, diarrhea, dysuria, joint swelling, nausea, neck pain, plugged ear sensation, rash, sneezing, sore throat, swollen glands, vomiting or wheezing. She has tried acetaminophen, decongestant and increased fluids for the symptoms. The treatment provided no relief.   Outpatient Medications Prior to Visit  Medication Sig   Magnesium 500 MG CAPS Take 500 mg by mouth daily.   metFORMIN (GLUCOPHAGE-XR) 500 MG 24 hr tablet Take 1 tablet (500 mg total) by mouth daily with breakfast.   Multiple Vitamin (MULTIVITAMIN WITH MINERALS) TABS tablet Take 1 tablet by mouth daily.   Vitamin D, Ergocalciferol, (DRISDOL) 1.25 MG (50000 UNIT) CAPS capsule Take 1 capsule (50,000 Units total) by mouth every 7 (seven) days.   No facility-administered medications prior to visit.   Reviewed past medical and social history.  Review of Systems  HENT:  Positive for congestion, ear pain, rhinorrhea and sinus pain. Negative for sneezing and sore throat.   Respiratory:  Positive for cough. Negative for wheezing.   Cardiovascular:  Negative for chest pain.  Gastrointestinal:  Negative for abdominal pain, diarrhea, nausea and vomiting.  Genitourinary:  Negative for dysuria.  Musculoskeletal:  Positive for joint pain. Negative for neck pain.  Skin:  Negative for rash.  Neurological:  Positive for headaches.   Per HPI      Objective:    Physical Exam Constitutional:      General: She is not in acute distress. HENT:     Right Ear: Ear canal and external ear normal. A middle ear effusion is present. Tympanic membrane is not injected, scarred, perforated or erythematous.     Left Ear: Ear canal and external ear normal. A middle ear effusion is present. Tympanic membrane is not injected, scarred, perforated or erythematous.     Nose: No nasal tenderness, mucosal edema, congestion or rhinorrhea.     Right Nostril: No occlusion.     Left Nostril: No occlusion.     Right Turbinates: Not enlarged, swollen or pale.     Left Turbinates: Not enlarged, swollen or pale.     Right Sinus: No maxillary sinus tenderness or frontal sinus tenderness.     Left Sinus: No maxillary sinus tenderness or frontal sinus tenderness.     Mouth/Throat:     Pharynx: Oropharynx is clear. Uvula midline.     Tonsils: No tonsillar exudate or tonsillar abscesses.  Eyes:     Extraocular Movements: Extraocular movements intact.     Conjunctiva/sclera: Conjunctivae normal.  Cardiovascular:     Rate and Rhythm: Normal rate and regular rhythm.     Pulses: Normal pulses.     Heart sounds: Normal heart sounds.  Pulmonary:     Effort: Pulmonary effort is normal.     Breath sounds: Normal breath sounds.  Musculoskeletal:     Cervical back: Normal range of motion and neck supple.  Lymphadenopathy:     Cervical: No  cervical adenopathy.  Neurological:     Mental Status: She is alert and oriented to person, place, and time.    BP 126/88 (BP Location: Right Arm)   Pulse 75   Temp 98 F (36.7 C)   Ht 5\' 8"  (1.727 m)   Wt 295 lb 6.4 oz (134 kg)   LMP 12/06/2022 (Approximate)   SpO2 96%   BMI 44.92 kg/m    Results for orders placed or performed in visit on 12/15/22  POC COVID-19  Result Value Ref Range   SARS Coronavirus 2 Ag Negative Negative      Assessment & Plan:   Problem List Items Addressed This Visit   None Visit  Diagnoses     Viral URI with cough    -  Primary   Relevant Medications   promethazine-dextromethorphan (PROMETHAZINE-DM) 6.25-15 MG/5ML syrup   Other Relevant Orders   POC COVID-19 (Completed)      Meds ordered this encounter  Medications   promethazine-dextromethorphan (PROMETHAZINE-DM) 6.25-15 MG/5ML syrup    Sig: Take 5 mLs by mouth 4 (four) times daily as needed for cough.    Dispense:  180 mL    Refill:  0    Order Specific Question:   Supervising Provider    Answer:   Mliss Sax [[id number]]   Return if symptoms worsen or fail to improve.    Alysia Penna, NP

## 2022-12-15 NOTE — Patient Instructions (Signed)
URI Instructions: Flonase and Afrin use: apply 1spray of afrin in each nare, wait , then apply 2sprays of flonase in each nare. Use both nasal spray consecutively x 3days, then flonase only for at least 14days.  Avoid decongestants if you have high blood pressure. Encourage adequate oral hydration. Use over-the-counter  cold" medicine  such as Dayquil/nyquil for cough and congestion.  Use mucinex DM or Robitussin  or delsym for cough without congestion  You can use plain "Tylenol" or "Advil" for fever, chills and achyness. Use cool mist humidifier at bedtime to help with nasal congestion and cough.  Cold/cough medications may have tylenol or ibuprofen or guaifenesin or dextromethophan in them, so be careful not to take beyond the recommended dose for each of these medications.   "Common cold" symptoms are usually triggered by a virus.  The antibiotics are usually not necessary. On average, a" viral cold" illness may take 7-10 days to resolve. Please, make an appointment if you are not better or if you're worse.

## 2023-01-13 ENCOUNTER — Ambulatory Visit (INDEPENDENT_AMBULATORY_CARE_PROVIDER_SITE_OTHER): Payer: 59 | Admitting: Family Medicine

## 2023-01-15 ENCOUNTER — Encounter: Payer: Self-pay | Admitting: Family Medicine

## 2023-01-15 ENCOUNTER — Ambulatory Visit (INDEPENDENT_AMBULATORY_CARE_PROVIDER_SITE_OTHER): Payer: 59 | Admitting: Family Medicine

## 2023-01-15 VITALS — BP 128/88 | HR 94 | Temp 98.8°F | Ht 68.5 in | Wt 294.5 lb

## 2023-01-15 DIAGNOSIS — Z0000 Encounter for general adult medical examination without abnormal findings: Secondary | ICD-10-CM | POA: Diagnosis not present

## 2023-01-15 DIAGNOSIS — F320 Major depressive disorder, single episode, mild: Secondary | ICD-10-CM | POA: Diagnosis not present

## 2023-01-15 MED ORDER — BUPROPION HCL ER (SR) 100 MG PO TB12
100.0000 mg | ORAL_TABLET | Freq: Two times a day (BID) | ORAL | 3 refills | Status: DC
Start: 2023-01-15 — End: 2023-04-02

## 2023-01-15 NOTE — Progress Notes (Signed)
Complete physical exam  Patient: Darlene Mccormick   DOB: 05-16-1975   46 y.o. Female  MRN: [id number]  Subjective:    Chief Complaint  Patient presents with   Annual Exam    Chasty Stamant is a 47 y.o. female who presents today for a complete physical exam. She reports consuming a  is followed at the weight loss center, is on a calorie controlled low carb  diet. Home exercise routine includes walking 2 hrs per week. She generally feels well. She reports sleeping well. She does not have additional problems to discuss today.    Most recent fall risk assessment:     No data to display           Most recent depression screenings:    01/15/2023    9:29 AM 01/13/2022    3:22 PM  PHQ 2/9 Scores  PHQ - 2 Score 2 0  PHQ- 9 Score 7 2    Vision:Not within last year  and Dental: No current dental problems and Receives regular dental care  Patient Active Problem List   Diagnosis Date Noted   Insulin resistance 09/28/2022   Depression 02/23/2022   Mixed hyperlipidemia 12/30/2021   Hypocalcemia 05/15/2021   NAFLD (nonalcoholic fatty liver disease) 40/98/1191   Prediabetes 06/08/2019   Class 3 severe obesity with serious comorbidity and body mass index (BMI) of 40.0 to 44.9 in adult [facility]) 05/17/2019   Fibroids 02/16/2018   S/P myomectomy 02/16/2018   Vitamin D deficiency 06/10/2016   GAD (generalized anxiety disorder) 06/10/2016   Hyperlipidemia, mixed 06/03/2016   Morbid obesity (HCC) 06/03/2016      Patient Care Team: Karie Georges, MD as PCP - General (Family Medicine)   Outpatient Medications Prior to Visit  Medication Sig   diphenhydrAMINE HCl (BENADRYL PO) Take by mouth.   fluticasone (FLONASE ALLERGY RELIEF) 50 MCG/ACT nasal spray Place into both nostrils as needed for allergies or rhinitis.   Magnesium 500 MG CAPS Take 500 mg by mouth daily.   Multiple Vitamin (MULTIVITAMIN WITH MINERALS) TABS tablet Take 1 tablet by mouth daily.   Vitamin D,  Ergocalciferol, (DRISDOL) 1.25 MG (50000 UNIT) CAPS capsule Take 1 capsule (50,000 Units total) by mouth every 7 (seven) days.   [DISCONTINUED] metFORMIN (GLUCOPHAGE-XR) 500 MG 24 hr tablet Take 1 tablet (500 mg total) by mouth daily with breakfast.   [DISCONTINUED] promethazine-dextromethorphan (PROMETHAZINE-DM) 6.25-15 MG/5ML syrup Take 5 mLs by mouth 4 (four) times daily as needed for cough.   No facility-administered medications prior to visit.    Review of Systems  HENT:  Negative for hearing loss.   Eyes:  Negative for blurred vision.  Respiratory:  Negative for shortness of breath.   Cardiovascular:  Negative for chest pain.  Gastrointestinal: Negative.   Genitourinary: Negative.   Musculoskeletal:  Negative for back pain.  Neurological:  Negative for headaches.  Psychiatric/Behavioral:  Negative for depression.   All other systems reviewed and are negative.      Objective:     BP 128/88 (BP Location: Left Arm, Patient Position: Sitting, Cuff Size: Large)   Pulse 94   Temp 98.8 F (37.1 C) (Oral)   Ht 5' 8.5" (1.74 m)   Wt 294 lb 8 oz (133.6 kg)   LMP 12/31/2022 (Exact Date)   SpO2 96%   BMI 44.13 kg/m    Physical Exam Vitals reviewed.  Constitutional:      Appearance: Normal appearance. She is well-groomed and normal weight.  HENT:  Right Ear: Tympanic membrane and ear canal normal.     Left Ear: Tympanic membrane and ear canal normal.     Mouth/Throat:     Mouth: Mucous membranes are moist.     Pharynx: No posterior oropharyngeal erythema.  Eyes:     Conjunctiva/sclera: Conjunctivae normal.  Neck:     Thyroid: No thyromegaly.  Cardiovascular:     Rate and Rhythm: Normal rate and regular rhythm.     Pulses: Normal pulses.     Heart sounds: S1 normal and S2 normal.  Pulmonary:     Effort: Pulmonary effort is normal.     Breath sounds: Normal breath sounds and air entry.  Abdominal:     General: Bowel sounds are normal.     Palpations: Abdomen is  soft.  Musculoskeletal:     Right lower leg: No edema.     Left lower leg: No edema.  Lymphadenopathy:     Cervical: No cervical adenopathy.  Neurological:     Mental Status: She is alert and oriented to person, place, and time. Mental status is at baseline.     Gait: Gait is intact.  Psychiatric:        Mood and Affect: Mood and affect normal.        Speech: Speech normal.        Behavior: Behavior normal.        Judgment: Judgment normal.      No results found for any visits on 01/15/23. Last metabolic panel Lab Results  Component Value Date   GLUCOSE 98 09/28/2022   NA 139 09/28/2022   K 4.9 09/28/2022   CL 101 09/28/2022   CO2 23 09/28/2022   BUN 7 09/28/2022   CREATININE 0.60 09/28/2022   EGFR 113 09/28/2022   CALCIUM 8.7 09/28/2022   PROT 6.8 09/28/2022   ALBUMIN 4.5 09/28/2022   LABGLOB 2.3 09/28/2022   AGRATIO 2.0 04/28/2022   BILITOT 0.9 09/28/2022   ALKPHOS 67 09/28/2022   AST 73 (H) 09/28/2022   ALT 104 (H) 09/28/2022   ANIONGAP 10 08/20/2022   Last lipids Lab Results  Component Value Date   CHOL 247 (H) 09/28/2022   HDL 36 (L) 09/28/2022   LDLCALC 153 (H) 09/28/2022   LDLDIRECT 170.0 12/27/2020   TRIG 313 (H) 09/28/2022   CHOLHDL 6.9 (H) 09/28/2022   Last hemoglobin A1c Lab Results  Component Value Date   HGBA1C 6.2 (H) 09/09/2022        Assessment & Plan:    Routine Health Maintenance and Physical Exam  Immunization History  Administered Date(s) Administered   Hepatitis A, Adult 06/04/2021, 01/13/2022   Hepatitis B 05/05/1999, 06/02/1999, 10/31/1999   Hepb-cpg 06/04/2021, 07/14/2021   Influenza,inj,Quad PF,6+ Mos 12/12/2017, 11/25/2018, 01/13/2022   Influenza-Unspecified 12/28/2020, 12/25/2022   MMR 09/13/1978, 11/14/1990   MODERNA COVID-19 SARS-COV-2 PEDS BIVALENT BOOSTER 93yr-21yr 02/13/2020, 12/09/2020   Moderna Covid-19 Vaccine Bivalent Booster 25yrs & up 08/28/2021   OPV 08/22/1981   PFIZER(Purple Top)SARS-COV-2 Vaccination  06/11/2019, 07/03/2019   Td 08/22/1981, 09/29/1995   Tdap 05/02/2021   Typhoid Inactivated 05/27/2021    Health Maintenance  Topic Date Due   Cervical Cancer Screening (HPV/Pap Cotest)  10/08/2020   Colonoscopy  Never done   COVID-19 Vaccine (6 - 2023-24 season) 01/31/2023 (Originally 11/29/2022)   HIV Screening  01/15/2024 (Originally 03/15/1992)   DTaP/Tdap/Td (3 - Td or Tdap) 05/03/2031   INFLUENZA VACCINE  Completed   Hepatitis C Screening  Completed   HPV VACCINES  Aged Out    Discussed health benefits of physical activity, and encouraged her to engage in regular exercise appropriate for her age and condition.  Depression, major, single episode, mild (HCC) -     buPROPion HCl ER (SR); Take 1 tablet (100 mg total) by mouth 2 (two) times daily.  Dispense: 30 tablet; Refill: 3  Routine general medical examination at a health care facility  Normal physical exam findings today except for mild depression symptoms reported. Patient states that her mom has Alzheimer's and they recently had to place her in a memory care facility. We discussion options for treatment, pt reports she is already in therapy regularly. We discussed risks/benefits of medication to treat her symptoms, bupropion discussed at length with the patient and she wants to try this medication. Pt is to send me a message on Mychart to let me know how the medication is working for her.   Return in 1 year (on 01/15/2024).     Karie Georges, MD

## 2023-01-15 NOTE — Patient Instructions (Addendum)
[email or web address]  [facility]  [facility]  [facility] Health Maintenance, Female Adopting a healthy lifestyle and getting preventive care are important in promoting health and wellness. Ask your health care provider about: The right schedule for you to have regular tests and exams. Things you can do on your own to prevent diseases and keep yourself healthy. What should I know about diet, weight, and exercise? Eat a healthy diet  Eat a diet that includes plenty of vegetables, fruits, low-fat dairy products, and lean protein. Do not eat a lot of foods that are high in solid fats, added sugars, or sodium. Maintain a healthy weight Body mass index (BMI) is used to identify weight problems. It estimates body fat based on height and weight. Your health care provider can help determine your BMI and help you achieve or maintain a healthy weight. Get regular exercise Get regular exercise. This is one of the most important things you can do for your health. Most adults should: Exercise for at least 150 minutes each week. The exercise should increase your heart rate and make you sweat (moderate-intensity exercise). Do strengthening exercises at least twice a week. This is in addition to the moderate-intensity exercise. Spend less time sitting. Even light physical activity can be beneficial. Watch cholesterol and blood lipids Have your blood tested for lipids and cholesterol at 46 years of age, then have this test every 5 years. Have your cholesterol levels checked more often if: Your lipid or cholesterol levels are high. You are older than 46 years of age. You are at high risk for heart disease. What should I know about cancer screening? Depending on your health history and family history, you may need to have cancer screening at various ages. This may include screening for: Breast cancer. Cervical cancer. Colorectal cancer. Skin cancer. Lung cancer. What should I know about heart  disease, diabetes, and high blood pressure? Blood pressure and heart disease High blood pressure causes heart disease and increases the risk of stroke. This is more likely to develop in people who have high blood pressure readings or are overweight. Have your blood pressure checked: Every 3-5 years if you are 18-49 years of age. Every year if you are 46 years old or older. Diabetes Have regular diabetes screenings. This checks your fasting blood sugar level. Have the screening done: Once every three years after age 46 if you are at a normal weight and have a low risk for diabetes. More often and at a younger age if you are overweight or have a high risk for diabetes. What should I know about preventing infection? Hepatitis B If you have a higher risk for hepatitis B, you should be screened for this virus. Talk with your health care provider to find out if you are at risk for hepatitis B infection. Hepatitis C Testing is recommended for: Everyone born from [birth year] through 1964. Anyone with known risk factors for hepatitis C. Sexually transmitted infections (STIs) Get screened for STIs, including gonorrhea and chlamydia, if: You are sexually active and are younger than 46 years of age. You are older than 46 years of age and your health care provider tells you that you are at risk for this type of infection. Your sexual activity has changed since you were last screened, and you are at increased risk for chlamydia or gonorrhea. Ask your health care provider if you are at risk. Ask your health care provider about whether you are at high risk for HIV. Your health care  provider may recommend a prescription medicine to help prevent HIV infection. If you choose to take medicine to prevent HIV, you should first get tested for HIV. You should then be tested every 3 months for as long as you are taking the medicine. Pregnancy If you are about to stop having your period (premenopausal) and you may become  pregnant, seek counseling before you get pregnant. Take 400 to 800 micrograms (mcg) of folic acid every day if you become pregnant. Ask for birth control (contraception) if you want to prevent pregnancy. Osteoporosis and menopause Osteoporosis is a disease in which the bones lose minerals and strength with aging. This can result in bone fractures. If you are 46 years old or older, or if you are at risk for osteoporosis and fractures, ask your health care provider if you should: Be screened for bone loss. Take a calcium or vitamin D supplement to lower your risk of fractures. Be given hormone replacement therapy (HRT) to treat symptoms of menopause. Follow these instructions at home: Alcohol use Do not drink alcohol if: Your health care provider tells you not to drink. You are pregnant, may be pregnant, or are planning to become pregnant. If you drink alcohol: Limit how much you have to: 0-1 drink a day. Know how much alcohol is in your drink. In the [place] one drink equals one 12 oz bottle of beer (355 mL), one 5 oz glass of wine (148 mL), or one 1 oz glass of hard liquor (44 mL). Lifestyle Do not use any products that contain nicotine or tobacco. These products include cigarettes, chewing tobacco, and vaping devices, such as e-cigarettes. If you need help quitting, ask your health care provider. Do not use street drugs. Do not share needles. Ask your health care provider for help if you need support or information about quitting drugs. General instructions Schedule regular health, dental, and eye exams. Stay current with your vaccines. Tell your health care provider if: You often feel depressed. You have ever been abused or do not feel safe at home. Summary Adopting a healthy lifestyle and getting preventive care are important in promoting health and wellness. Follow your health care provider's instructions about healthy diet, exercising, and getting tested or screened for  diseases. Follow your health care provider's instructions on monitoring your cholesterol and blood pressure. This information is not intended to replace advice given to you by your health care provider. Make sure you discuss any questions you have with your health care provider. Document Revised: 08/05/2020 Document Reviewed: 08/05/2020 Elsevier Patient Education  2024 [employer].

## 2023-01-28 ENCOUNTER — Encounter: Payer: Self-pay | Admitting: Family Medicine

## 2023-01-28 ENCOUNTER — Ambulatory Visit (INDEPENDENT_AMBULATORY_CARE_PROVIDER_SITE_OTHER): Payer: 59 | Admitting: Family Medicine

## 2023-01-28 ENCOUNTER — Encounter (INDEPENDENT_AMBULATORY_CARE_PROVIDER_SITE_OTHER): Payer: Self-pay | Admitting: Family Medicine

## 2023-01-28 VITALS — BP 138/80 | HR 82 | Temp 98.0°F | Ht 68.0 in | Wt 290.0 lb

## 2023-01-28 DIAGNOSIS — K760 Fatty (change of) liver, not elsewhere classified: Secondary | ICD-10-CM

## 2023-01-28 DIAGNOSIS — E559 Vitamin D deficiency, unspecified: Secondary | ICD-10-CM

## 2023-01-28 DIAGNOSIS — E782 Mixed hyperlipidemia: Secondary | ICD-10-CM | POA: Diagnosis not present

## 2023-01-28 DIAGNOSIS — Z6841 Body Mass Index (BMI) 40.0 and over, adult: Secondary | ICD-10-CM

## 2023-01-28 DIAGNOSIS — R7303 Prediabetes: Secondary | ICD-10-CM | POA: Diagnosis not present

## 2023-01-28 DIAGNOSIS — E669 Obesity, unspecified: Secondary | ICD-10-CM

## 2023-01-28 NOTE — Progress Notes (Signed)
Darlene Mccormick, D.O.  ABFM, ABOM Specializing in Clinical Bariatric Medicine  Office located at: [address] [place], [place]  [address]   Assessment and Plan:   Orders Placed This Encounter  Procedures   VITAMIN D 25 Hydroxy (Vit-D Deficiency, Fractures)   Comprehensive metabolic panel   Hemoglobin A1c    Prediabetes Assessment & Plan: No current meds. Diet/exercise approach. Most recent Hemoglobin A1c of 6.2 on 09/09/22. Hunger and cravings are better controlled when pt follows her prudent nutritional.   Darlene Mccormick will continue to work on weight loss, exercise, via their meal plan we devised to help decrease the risk of progressing to diabetes. We will recheck A1c and CMP today.    Mixed hyperlipidemia Assessment & Plan: Pt presented to ER on 5/24 due to chest pain. She subsequently was seen by cardiology on 8/2 and they obtained a coronary calcium CT scan and NMR lipid panel along with a Lp(a). Her coronary calcium score was 0. She has not heard back about lipid panel results.   We recommended pt contact cardiology team about labs done on 8/2 and inquire about subsequent treatment options. Continue with heart healthy, low cholesterol meal plan. Will continue to monitor condition alongside PCP/specialists.    NAFLD (nonalcoholic fatty liver disease) Assessment & Plan: Pt's liver enzymes are elevated with known fatty liver disease.    Treatment includes weight loss, elimination of sweet drinks, including juice, avoidance of high fructose corn syrup, and exercise. As always, avoiding alcohol consumption is important. Treatment goal: 7-10% reduction of body weight. Will recheck liver enzymes today.    Vitamin D deficiency Assessment & Plan: Pt prescribed ERGO 50,000 units once a week; she reports taking it roughly 50% of the time. Most recent vit D of 35.7 on 09/09/22.   Continue with weight loss efforts and pt encouraged to take high-dose vit D on a consistent basis.  Vitamin D; future.    TREATMENT PLAN FOR OBESITY: BMI 40.0-44.9, adult (HCC) current bmi 44.1 Obesity- starting BMI 42.89 Assessment & Plan: Darlene Mccormick is here to discuss her progress with her obesity treatment plan along with follow-up of her obesity related diagnoses. See Medical Weight Management Flowsheet for complete bioelectrical impedance results.  Since last office visit on 12/10/22 patient's Muscle mass has increased by 3.2 lb. Fat mass has decreased by 2.4 lb. Total body water has decreased by 3.6 lb.  Counseling done on how various foods will affect these numbers and how to maximize success  Total lbs lost to date: + 8 lbs  Total weight loss percentage to date: + 2.84%  We modified pt's journaling parameters due to her high metabolism: Journaling 1800-2,000 calories and 120+ grams protein.   Discussed different crock-pot meals that pt can easily prepare.   Discussed ways that pt can regularly obtain her protein goals.   Behavioral Intervention Additional resources provided today: food journaling log Evidence-based interventions for health behavior change were utilized today including the discussion of self monitoring techniques, problem-solving barriers and SMART goal setting techniques.   Regarding patient's less desirable eating habits and patterns, we employed the technique of small changes.  Pt will specifically work on: tracking foods/bringing in food log.   FOLLOW UP: Return 02/16/23. She was informed of the importance of frequent follow up visits to maximize her success with intensive lifestyle modifications for her multiple health conditions.  Darlene Mccormick is aware that we will review all of her lab results at our next visit.  She is aware that if anything is critical/ life threatening with the results, we will be contacting her via MyChart prior to the office visit to discuss management.    Subjective:   Chief complaint: Obesity Darlene Mccormick is here to  discuss her progress with her obesity treatment plan. She is  keeping a food journal and adhering to recommended goals of 1800 calories and 90+ protein and states she is following her eating plan approximately 65-70% of the time. She states she is walking 30 minutes 4 days per week.  Interval History:  Darlene Mccormick is here for a follow up office visit. Since last OV, Darlene Mccormick is up 1 lb. Pt purchased crock pot and reports cooking more at home. Pt admits that its challenging to adhere to 90+ grams protein. Pt not consistently journaling intake. On most days she met her goal of eating everything on plan for lunch.   Pharmacotherapy for weight loss: She is currently taking  Wellbutrin SR 100 mg once daily  for medical weight loss.  Denies side effects.    Review of Systems:  Pertinent positives were addressed with patient today.  Reviewed by clinician on day of visit: allergies, medications, problem list, medical history, surgical history, family history, social history, and previous encounter notes.  Weight Summary and Biometrics   Weight Lost Since Last Visit: 0lb  Weight Gained Since Last Visit: 1lb   Vitals Temp: 98 F (36.7 C) BP: 138/80 Pulse Rate: 82 SpO2: 98 %   Anthropometric Measurements Height: 5\' 8"  (1.727 m) Weight: 290 lb (131.5 kg) BMI (Calculated): 44.1 Weight at Last Visit: 289lb Weight Lost Since Last Visit: 0lb Weight Gained Since Last Visit: 1lb Starting Weight: 282lb   Body Composition  Body Fat %: 49.9 % Fat Mass (lbs): 145 lbs Muscle Mass (lbs): 138.2 lbs Total Body Water (lbs): 106.6 lbs Visceral Fat Rating : 16   Other Clinical Data Fasting: no Labs: no Today's Visit #: 14 Starting Date: 05/13/21   Objective:   PHYSICAL EXAM: Blood pressure 138/80, pulse 82, temperature 98 F (36.7 C), height 5\' 8"  (1.727 m), weight 290 lb (131.5 kg), last menstrual period 12/31/2022, SpO2 98%. Body mass index is 44.09 kg/m.  General: Well  Developed, well nourished, and in no acute distress.  HEENT: Normocephalic, atraumatic Skin: Warm and dry, cap RF less 2 sec, good turgor Chest:  Normal excursion, shape, no gross abn Respiratory: speaking in full sentences, no conversational dyspnea NeuroM-Sk: Ambulates w/o assistance, moves * 4 Psych: A and O *3, insight good, mood-full  DIAGNOSTIC DATA REVIEWED:  BMET    Component Value Date/Time   NA 139 09/28/2022 1134   K 4.9 09/28/2022 1134   CL 101 09/28/2022 1134   CO2 23 09/28/2022 1134   GLUCOSE 98 09/28/2022 1134   GLUCOSE 115 (H) 08/20/2022 2325   BUN 7 09/28/2022 1134   CREATININE 0.60 09/28/2022 1134   CREATININE 0.66 11/25/2018 1600   CALCIUM 8.7 09/28/2022 1134   GFRNONAA >60 08/20/2022 2325   GFRAA 119 03/06/2020 0904   Lab Results  Component Value Date   HGBA1C 6.2 (H) 09/09/2022   HGBA1C 5.6 11/26/2017   Lab Results  Component Value Date   INSULIN 21.7 09/28/2022   INSULIN 16.2 05/03/2019   Lab Results  Component Value Date   TSH 1.420 04/28/2022   CBC    Component Value Date/Time   WBC 8.9 08/20/2022 2325   RBC 4.51 08/20/2022 2325   HGB 12.9 08/20/2022 2325  HGB 12.6 03/06/2020 0904   HCT 39.2 08/20/2022 2325   HCT 37.4 03/06/2020 0904   PLT 387 08/20/2022 2325   PLT 358 03/06/2020 0904   MCV 86.9 08/20/2022 2325   MCV 87 03/06/2020 0904   MCH 28.6 08/20/2022 2325   MCHC 32.9 08/20/2022 2325   RDW 12.7 08/20/2022 2325   RDW 12.7 03/06/2020 0904   Iron Studies    Component Value Date/Time   IRON 62 01/05/2019 0933   FERRITIN 57.7 01/05/2019 0933   Lipid Panel     Component Value Date/Time   CHOL 247 (H) 09/28/2022 1134   CHOL 246 (H) 01/05/2019 0933   TRIG 313 (H) 09/28/2022 1134   TRIG 296 (H) 01/05/2019 0933   HDL 36 (L) 09/28/2022 1134   CHOLHDL 6.9 (H) 09/28/2022 1134   CHOLHDL 6 12/27/2020 1004   VLDL 50.0 (H) 12/27/2020 1004   LDLCALC 153 (H) 09/28/2022 1134   LDLCALC 193 (H) 11/25/2018 1600   LDLDIRECT 170.0  12/27/2020 1004   Hepatic Function Panel     Component Value Date/Time   PROT 6.8 09/28/2022 1134   ALBUMIN 4.5 09/28/2022 1134   AST 73 (H) 09/28/2022 1134   ALT 104 (H) 09/28/2022 1134   ALKPHOS 67 09/28/2022 1134   BILITOT 0.9 09/28/2022 1134   BILIDIR <0.1 08/21/2022 0208   IBILI NOT CALCULATED 08/21/2022 0208      Component Value Date/Time   TSH 1.420 04/28/2022 0958   TSH 1.41 12/27/2020 1004   Nutritional Lab Results  Component Value Date   VD25OH 35.7 09/09/2022   VD25OH 34.2 04/28/2022   VD25OH 46.0 11/03/2021    Attestations:   I, Special Puri, acting as a Stage manager for [employer], DO., have compiled all relevant documentation for today's office visit on behalf of Thomasene Lot, DO, while in the presence of [employer], DO.  I have reviewed the above documentation for accuracy and completeness, and I agree with the above. Darlene Mccormick, D.O.  The 21st Century Cures Act was signed into law in 2016 which includes the topic of electronic health records.  This provides immediate access to information in MyChart.  This includes consultation notes, operative notes, office notes, lab results and pathology reports.  If you have any questions about what you read please let us know at your next visit so we can discuss your concerns and take corrective action if need be.  We are right here with you.

## 2023-01-29 LAB — COMPREHENSIVE METABOLIC PANEL
ALT: 57 [IU]/L — ABNORMAL HIGH (ref 0–32)
AST: 36 [IU]/L (ref 0–40)
Albumin: 4.2 g/dL (ref 3.9–4.9)
Alkaline Phosphatase: 77 [IU]/L (ref 44–121)
BUN/Creatinine Ratio: 13 (ref 9–23)
BUN: 9 mg/dL (ref 6–24)
Bilirubin Total: 0.8 mg/dL (ref 0.0–1.2)
CO2: 22 mmol/L (ref 20–29)
Calcium: 8.9 mg/dL (ref 8.7–10.2)
Chloride: 102 mmol/L (ref 96–106)
Creatinine, Ser: 0.67 mg/dL (ref 0.57–1.00)
Globulin, Total: 2.5 g/dL (ref 1.5–4.5)
Glucose: 125 mg/dL — ABNORMAL HIGH (ref 70–99)
Potassium: 5.1 mmol/L (ref 3.5–5.2)
Sodium: 141 mmol/L (ref 134–144)
Total Protein: 6.7 g/dL (ref 6.0–8.5)
eGFR: 110 mL/min/{1.73_m2} (ref >59)

## 2023-01-29 LAB — HEMOGLOBIN A1C
Est. average glucose Bld gHb Est-mCnc: 137 mg/dL
Hgb A1c MFr Bld: 6.4 % — ABNORMAL HIGH (ref 4.8–5.6)

## 2023-01-29 LAB — VITAMIN D 25 HYDROXY (VIT D DEFICIENCY, FRACTURES): Vit D, 25-Hydroxy: 42.1 ng/mL (ref 30.0–100.0)

## 2023-02-14 ENCOUNTER — Other Ambulatory Visit (INDEPENDENT_AMBULATORY_CARE_PROVIDER_SITE_OTHER): Payer: Self-pay | Admitting: Family Medicine

## 2023-02-14 DIAGNOSIS — R7303 Prediabetes: Secondary | ICD-10-CM

## 2023-02-16 ENCOUNTER — Ambulatory Visit (INDEPENDENT_AMBULATORY_CARE_PROVIDER_SITE_OTHER): Payer: 59 | Admitting: Family Medicine

## 2023-03-11 ENCOUNTER — Ambulatory Visit (INDEPENDENT_AMBULATORY_CARE_PROVIDER_SITE_OTHER): Payer: 59 | Admitting: Family Medicine

## 2023-03-17 ENCOUNTER — Other Ambulatory Visit (INDEPENDENT_AMBULATORY_CARE_PROVIDER_SITE_OTHER): Payer: Self-pay | Admitting: Family Medicine

## 2023-03-17 DIAGNOSIS — E559 Vitamin D deficiency, unspecified: Secondary | ICD-10-CM

## 2023-04-02 ENCOUNTER — Other Ambulatory Visit: Payer: Self-pay | Admitting: Family Medicine

## 2023-04-02 DIAGNOSIS — F320 Major depressive disorder, single episode, mild: Secondary | ICD-10-CM

## 2023-04-20 ENCOUNTER — Ambulatory Visit: Payer: 59 | Admitting: Family Medicine

## 2023-04-20 ENCOUNTER — Encounter: Payer: Self-pay | Admitting: Family Medicine

## 2023-04-20 VITALS — BP 134/86 | HR 88 | Temp 97.6°F | Ht 68.0 in | Wt 292.4 lb

## 2023-04-20 DIAGNOSIS — J069 Acute upper respiratory infection, unspecified: Secondary | ICD-10-CM | POA: Diagnosis not present

## 2023-04-20 DIAGNOSIS — R6889 Other general symptoms and signs: Secondary | ICD-10-CM | POA: Diagnosis not present

## 2023-04-20 LAB — POCT INFLUENZA A/B
Influenza A, POC: NEGATIVE
Influenza B, POC: NEGATIVE

## 2023-04-20 LAB — POC COVID19 BINAXNOW: SARS Coronavirus 2 Ag: NEGATIVE

## 2023-04-20 MED ORDER — BENZONATATE 100 MG PO CAPS: 100.0000 mg | ORAL_CAPSULE | Freq: Three times a day (TID) | ORAL | 0 refills | Status: DC | PRN

## 2023-04-20 NOTE — Progress Notes (Signed)
Established Patient Office Visit  Subjective   Patient ID: Darlene Mccormick, female    DOB: April 04, 1975  Age: 48 y.o. MRN: [id number]  Chief Complaint  Patient presents with   Cough    Cough congestion, wheezing, started 4 days ago, mucus- yellow     HPI   Darlene Mccormick is seen today as a work in with 4-day history of cough and possible intermittent wheezing and some nasal congestion.  Darlene Mccormick has had cough productive of yellow sputum at times.  No documented fever.  Mild body aches.  Just got back yesterday from a trip to [place].  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  Several of them are developing symptoms now.  Darlene Mccormick denies any significant dyspnea.  No nausea, vomiting, or diarrhea.  No history of asthma reported.  Non-smoker.  Past Medical History:  Diagnosis Date   Anxiety    B12 deficiency    Depression    Elevated liver enzymes    Fatty liver    Gallstones    High triglycerides    Hyperlipidemia    Hypertension    not recently   Joint pain    Obesity    Pre-diabetes    Uterine fibroid    Vitamin D deficiency    Past Surgical History:  Procedure Laterality Date   APPENDECTOMY  1997   MYOMECTOMY N/A 02/16/2018   Procedure: ABDOMINAL MYOMECTOMY;  Surgeon: Geryl Rankins, MD;  Location: [facility];  Service: Gynecology;  Laterality: N/A;   TONSILLECTOMY AND ADENOIDECTOMY  1988   TRACHEOSTOMY  1979   done during epiglotitis   UTERINE FIBROID SURGERY      reports that Darlene Mccormick has never smoked. Darlene Mccormick has never used smokeless tobacco. Darlene Mccormick reports current alcohol use of about 1.0 standard drink of alcohol per week. Darlene Mccormick reports that Darlene Mccormick does not use drugs. family history includes Alcohol abuse in her maternal grandfather; Anxiety disorder in her father; Breast cancer in her maternal grandmother and another family member; Diabetes in her mother; Heart attack (age of onset: 78) in her paternal grandmother; High Cholesterol in her mother; High  blood pressure in her brother; Hyperlipidemia in her father; Hypertension in her father; Obesity in her father and mother; Other in her mother; Sleep apnea in her father. No Known Allergies  Review of Systems  Constitutional:  Negative for chills and fever.  HENT:  Positive for congestion.   Respiratory:  Positive for cough.       Objective:     BP 134/86 (BP Location: Left Arm, Patient Position: Sitting, Cuff Size: Large)   Pulse 88   Temp 97.6 F (36.4 C) (Oral)   Ht 5\' 8"  (1.727 m)   Wt 292 lb 6.4 oz (132.6 kg)   LMP  (LMP Unknown)   SpO2 97%   BMI 44.46 kg/m  BP Readings from Last 3 Encounters:  04/20/23 134/86  01/28/23 138/80  01/15/23 128/88   Wt Readings from Last 3 Encounters:  04/20/23 292 lb 6.4 oz (132.6 kg)  01/28/23 290 lb (131.5 kg)  01/15/23 294 lb 8 oz (133.6 kg)      Physical Exam Vitals reviewed.  Constitutional:      General: Darlene Mccormick is not in acute distress.    Appearance: Darlene Mccormick is not ill-appearing or toxic-appearing.  Cardiovascular:     Rate and Rhythm: Normal rate and regular rhythm.  Pulmonary:     Effort: Pulmonary effort is normal.     Breath sounds:  Normal breath sounds. No wheezing or rales.  Neurological:     Mental Status: Darlene Mccormick is alert.      No results found for any visits on 04/20/23.    The 10-year ASCVD risk score (Arnett DK, et al., 2019) is: 2.8%    Assessment & Plan:   Probable viral URI with cough.  Patient is requesting influenza and COVID testing even though Darlene Mccormick has past window generally for treatment especially for flu.  -COVID and influenza testing negative. -Treat symptomatically with plenty of fluids and rest and over-the-counter medications as needed. -We did send him some Tessalon 100 mg every 8 hours as needed for cough #30 -Follow-up promptly for any fever or worsening symptoms.    Evelena Peat, MD

## 2023-04-22 ENCOUNTER — Encounter (INDEPENDENT_AMBULATORY_CARE_PROVIDER_SITE_OTHER): Payer: Self-pay | Admitting: Family Medicine

## 2023-04-22 ENCOUNTER — Ambulatory Visit (INDEPENDENT_AMBULATORY_CARE_PROVIDER_SITE_OTHER): Payer: 59 | Admitting: Family Medicine

## 2023-04-22 VITALS — BP 141/90 | HR 80 | Temp 98.0°F | Ht 68.0 in | Wt 287.0 lb

## 2023-04-22 DIAGNOSIS — Z6841 Body Mass Index (BMI) 40.0 and over, adult: Secondary | ICD-10-CM

## 2023-04-22 DIAGNOSIS — E669 Obesity, unspecified: Secondary | ICD-10-CM

## 2023-04-22 DIAGNOSIS — R7303 Prediabetes: Secondary | ICD-10-CM

## 2023-04-22 DIAGNOSIS — E559 Vitamin D deficiency, unspecified: Secondary | ICD-10-CM

## 2023-04-22 DIAGNOSIS — E66813 Obesity, class 3: Secondary | ICD-10-CM

## 2023-04-22 DIAGNOSIS — K760 Fatty (change of) liver, not elsewhere classified: Secondary | ICD-10-CM

## 2023-04-22 MED ORDER — VITAMIN D (ERGOCALCIFEROL) 1.25 MG (50000 UNIT) PO CAPS
50000.0000 [IU] | ORAL_CAPSULE | ORAL | 0 refills | Status: DC
Start: 2023-04-22 — End: 2023-05-26

## 2023-04-22 NOTE — Progress Notes (Signed)
Darlene Mccormick, D.O.  ABFM, ABOM Specializing in Clinical Bariatric Medicine  Office located at: [address] [place], [place]  [address]   Assessment and Plan:   FOR THE DISEASE OF OBESITY: BMI 40.0-44.9, adult (HCC) current bmi 43.65 Obesity - starting BMI 42.89 Assessment & Plan: Since last office visit on 01/28/23 patient's  Muscle mass has increased by 2.2 lb. Fat mass has decreased by 5.8 lb. Total body water has decreased by 3.59 lb.  Counseling done on how various foods will affect these numbers and how to maximize success  Total lbs lost to date: +5 lbs  Total weight loss percentage to date: 1.77%    Recommended Dietary Goals Darlene Mccormick is currently in the action stage of change. As such, her goal is to continue weight management plan.  She has agreed to: continue current plan   Behavioral Intervention We discussed the following today: continue to work on maintaining a reduced calorie state, getting the recommended amount of protein, incorporating whole foods, making healthy choices, staying well hydrated and practicing mindfulness when eating.  Additional resources provided today:  handout on Healthy Eating Principles.  Evidence-based interventions for health behavior change were utilized today including the discussion of self monitoring techniques, problem-solving barriers and SMART goal setting techniques.   Regarding patient's less desirable eating habits and patterns, we employed the technique of small changes.   Pt will specifically work on: n/a   Recommended Physical Activity Goals Darlene Mccormick has been advised to work up to 150 minutes of moderate intensity aerobic activity a week and strengthening exercises 2-3 times per week for cardiovascular health, weight loss maintenance and preservation of muscle mass.   She has agreed to : Continue current level of physical activity    Pharmacotherapy We both agreed to : continue with nutritional and behavioral  strategies   FOR ASSOCIATED CONDITIONS ADDRESSED TODAY:  Prediabetes Assessment & Plan: Lab Results  Component Value Date   HGBA1C 6.4 (H) 01/28/2023   Lab Results  Component Value Date   CREATININE 0.67 01/28/2023   BUN 9 01/28/2023   NA 141 01/28/2023   K 5.1 01/28/2023   CL 102 01/28/2023   CO2 22 01/28/2023   Most recent labs above. Explained to pt that her Hemoglobin A1c is on the cusp of diabetes. Pt has tried Metformin in the past; she states that while on Metformin "it did not help and felt foggy". Kidney function and electrolytes within recommended limits.   Intensive lifestyle modification including diet, exercise and weight loss are the first line of treatment to prevent progression to diabetes. We extensively discussed the importance of decreasing simple carbs, increasing proteins and how certain foods they eat will affect their blood sugars. Will put in orders for Hemoglobin A1c and BMP today and pt will come in around Feb 1st to obtain labs.   Orders: -     Hemoglobin A1c   Metabolic dysfunction-associated steatotic liver disease (MASLD) Assessment & Plan:    Component Value Date/Time   PROT 6.7 01/28/2023 0855   ALBUMIN 4.2 01/28/2023 0855   AST 36 01/28/2023 0855   ALT 57 (H) 01/28/2023 0855   ALKPHOS 77 01/28/2023 0855   BILITOT 0.8 01/28/2023 0855   BILIDIR <0.1 08/21/2022 0208   IBILI NOT CALCULATED 08/21/2022 0208    Most recent liver enzymes above. ALT has improved from 104 to 57. Continue weight loss efforts (5-7% reduction in body fat percentage) and exercise regiment. Consider GLP-1 medication in the future.  F/up with hepatologist as directed.  Orders: -     Basic metabolic panel   Vitamin D deficiency Assessment & Plan: Lab Results  Component Value Date   VD25OH 42.1 01/28/2023   VD25OH 35.7 09/09/2022   VD25OH 34.2 04/28/2022   Most recent Vitamin D above. Prior to labs on 10/31, pt endorses taking her high dose vitamin D every 2-3  weeks. After labs were drawn, pt admits that she has essentially not been taking it. Discussed the importance of Vitamin D - pt encouraged to take her once weekly Vitamin D consistently.   Orders: -     Vitamin D (Ergocalciferol); Take 1 capsule (50,000 Units total) by mouth every 7 (seven) days.  Dispense: 8 capsule; Refill: 0   Follow up:   Return 05/20/2023. She was informed of the importance of frequent follow up visits to maximize her success with intensive lifestyle modifications for her multiple health conditions.  Darlene Mccormick is aware that we will review all of her lab results at our next visit.  She is aware that if anything is critical/ life threatening with the results, we will be contacting her via MyChart prior to the office visit to discuss management.    Subjective:   Chief complaint: Obesity Darlene Mccormick is here to discuss her progress with her obesity treatment plan. She is  keeping a food journal and adhering to recommended goals of 1800 calories and 90+ protein and states she is following her eating plan approximately 50% of the time. She states she walked 10,000-15,000 steps on vacation.   Interval History:  Darlene Mccormick is here for a follow up office visit. Since last OV on 01/28/23, pt is down 3 lbs. Pt enjoyed her holidays - she traveled/visited family. Recently, pt returned from [place] for a study group. There she endorses eating relatively healthy and walking a lot.   Pharmacotherapy for weight loss: She is currently taking  Wellbutrin SR 100 mg daily .   Review of Systems:  Pertinent positives were addressed with patient today.  Reviewed by clinician on day of visit: allergies, medications, problem list, medical history, surgical history, family history, social history, and previous encounter notes.  Weight Summary and Biometrics   Weight Lost Since Last Visit: 3lb  Weight Gained Since Last Visit: 0lb    Vitals Temp: 98 F (36.7 C) BP: (!)  141/90 Pulse Rate: 80 SpO2: 98 %   Anthropometric Measurements Height: 5\' 8"  (1.727 m)   Body Composition  Body Fat %: 48.5 % Fat Mass (lbs): 139.2 lbs Muscle Mass (lbs): 140.4 lbs Total Body Water (lbs): 103 lbs Visceral Fat Rating : 15   Other Clinical Data Fasting: no Labs: no Today's Visit #: 15 Starting Date: 05/13/21   Objective:   PHYSICAL EXAM: Blood pressure (!) 141/90, pulse 80, temperature 98 F (36.7 C), height 5\' 8"  (1.727 m), SpO2 98%. Body mass index is 44.46 kg/m.  General: she is overweight, cooperative and in no acute distress. PSYCH: Has normal mood, affect and thought process.   HEENT: EOMI, sclerae are anicteric. Lungs: Normal breathing effort, no conversational dyspnea. Extremities: Moves * 4 Neurologic: A and O * 3, good insight  DIAGNOSTIC DATA REVIEWED: BMET    Component Value Date/Time   NA 141 01/28/2023 0855   K 5.1 01/28/2023 0855   CL 102 01/28/2023 0855   CO2 22 01/28/2023 0855   GLUCOSE 125 (H) 01/28/2023 0855   GLUCOSE 115 (H) 08/20/2022 2325   BUN 9 01/28/2023  0855   CREATININE 0.67 01/28/2023 0855   CREATININE 0.66 11/25/2018 1600   CALCIUM 8.9 01/28/2023 0855   GFRNONAA >60 08/20/2022 2325   GFRAA 119 03/06/2020 0904   Lab Results  Component Value Date   HGBA1C 6.4 (H) 01/28/2023   HGBA1C 5.6 11/26/2017   Lab Results  Component Value Date   INSULIN 21.7 09/28/2022   INSULIN 16.2 05/03/2019   Lab Results  Component Value Date   TSH 1.420 04/28/2022   CBC    Component Value Date/Time   WBC 8.9 08/20/2022 2325   RBC 4.51 08/20/2022 2325   HGB 12.9 08/20/2022 2325   HGB 12.6 03/06/2020 0904   HCT 39.2 08/20/2022 2325   HCT 37.4 03/06/2020 0904   PLT 387 08/20/2022 2325   PLT 358 03/06/2020 0904   MCV 86.9 08/20/2022 2325   MCV 87 03/06/2020 0904   MCH 28.6 08/20/2022 2325   MCHC 32.9 08/20/2022 2325   RDW 12.7 08/20/2022 2325   RDW 12.7 03/06/2020 0904   Iron Studies    Component Value Date/Time    IRON 62 01/05/2019 0933   FERRITIN 57.7 01/05/2019 0933   Lipid Panel     Component Value Date/Time   CHOL 247 (H) 09/28/2022 1134   CHOL 246 (H) 01/05/2019 0933   TRIG 313 (H) 09/28/2022 1134   TRIG 296 (H) 01/05/2019 0933   HDL 36 (L) 09/28/2022 1134   CHOLHDL 6.9 (H) 09/28/2022 1134   CHOLHDL 6 12/27/2020 1004   VLDL 50.0 (H) 12/27/2020 1004   LDLCALC 153 (H) 09/28/2022 1134   LDLCALC 193 (H) 11/25/2018 1600   LDLDIRECT 170.0 12/27/2020 1004   Hepatic Function Panel     Component Value Date/Time   PROT 6.7 01/28/2023 0855   ALBUMIN 4.2 01/28/2023 0855   AST 36 01/28/2023 0855   ALT 57 (H) 01/28/2023 0855   ALKPHOS 77 01/28/2023 0855   BILITOT 0.8 01/28/2023 0855   BILIDIR <0.1 08/21/2022 0208   IBILI NOT CALCULATED 08/21/2022 0208      Component Value Date/Time   TSH 1.420 04/28/2022 0958   TSH 1.41 12/27/2020 1004   Nutritional Lab Results  Component Value Date   VD25OH 42.1 01/28/2023   VD25OH 35.7 09/09/2022   VD25OH 34.2 04/28/2022    Attestations:   I, Special Puri, acting as a Stage manager for [employer], DO., have compiled all relevant documentation for today's office visit on behalf of Thomasene Lot, DO, while in the presence of [employer], DO.  Reviewed by clinician on day of visit: allergies, medications, problem list, medical history, surgical history, family history, social history, and previous encounter notes pertinent to patient's obesity diagnosis. I have spent 40 minutes in the care of the patient today including: preparing to see patient (e.g. review and interpretation of tests, old notes ), obtaining and/or reviewing separately obtained history, performing a medically appropriate examination or evaluation, counseling and educating the patient, ordering medications, test or procedures, documenting clinical information in the electronic or other health care record, and independently interpreting results and communicating results to  the patient, family, or caregiver   I have reviewed the above documentation for accuracy and completeness, and I agree with the above. Darlene Mccormick, D.O.  The 21st Century Cures Act was signed into law in 2016 which includes the topic of electronic health records.  This provides immediate access to information in MyChart.  This includes consultation notes, operative notes, office notes, lab results and pathology reports.  If  you have any questions about what you read please let us know at your next visit so we can discuss your concerns and take corrective action if need be.  We are right here with you.

## 2023-04-29 LAB — BASIC METABOLIC PANEL
BUN/Creatinine Ratio: 15 (ref 9–23)
BUN: 8 mg/dL (ref 6–24)
CO2: 21 mmol/L (ref 20–29)
Calcium: 8.5 mg/dL — ABNORMAL LOW (ref 8.7–10.2)
Chloride: 103 mmol/L (ref 96–106)
Creatinine, Ser: 0.53 mg/dL — ABNORMAL LOW (ref 0.57–1.00)
Glucose: 125 mg/dL — ABNORMAL HIGH (ref 70–99)
Potassium: 4.8 mmol/L (ref 3.5–5.2)
Sodium: 140 mmol/L (ref 134–144)
eGFR: 115 mL/min/{1.73_m2} (ref >59)

## 2023-04-29 LAB — HEMOGLOBIN A1C
Est. average glucose Bld gHb Est-mCnc: 137 mg/dL
Hgb A1c MFr Bld: 6.4 % — ABNORMAL HIGH (ref 4.8–5.6)

## 2023-05-19 ENCOUNTER — Encounter (INDEPENDENT_AMBULATORY_CARE_PROVIDER_SITE_OTHER): Payer: Self-pay

## 2023-05-20 ENCOUNTER — Ambulatory Visit (INDEPENDENT_AMBULATORY_CARE_PROVIDER_SITE_OTHER): Payer: 59 | Admitting: Family Medicine

## 2023-05-26 ENCOUNTER — Ambulatory Visit (INDEPENDENT_AMBULATORY_CARE_PROVIDER_SITE_OTHER): Payer: 59 | Admitting: Family Medicine

## 2023-05-26 ENCOUNTER — Encounter (INDEPENDENT_AMBULATORY_CARE_PROVIDER_SITE_OTHER): Payer: Self-pay | Admitting: Family Medicine

## 2023-05-26 VITALS — BP 136/84 | HR 92 | Temp 98.1°F | Ht 68.0 in | Wt 291.0 lb

## 2023-05-26 DIAGNOSIS — E559 Vitamin D deficiency, unspecified: Secondary | ICD-10-CM | POA: Diagnosis not present

## 2023-05-26 DIAGNOSIS — Z6841 Body Mass Index (BMI) 40.0 and over, adult: Secondary | ICD-10-CM

## 2023-05-26 DIAGNOSIS — F5089 Other specified eating disorder: Secondary | ICD-10-CM | POA: Diagnosis not present

## 2023-05-26 DIAGNOSIS — R7303 Prediabetes: Secondary | ICD-10-CM

## 2023-05-26 DIAGNOSIS — E669 Obesity, unspecified: Secondary | ICD-10-CM

## 2023-05-26 DIAGNOSIS — F3289 Other specified depressive episodes: Secondary | ICD-10-CM

## 2023-05-26 MED ORDER — VITAMIN D (ERGOCALCIFEROL) 1.25 MG (50000 UNIT) PO CAPS
50000.0000 [IU] | ORAL_CAPSULE | ORAL | 1 refills | Status: DC
Start: 2023-05-26 — End: 2023-07-07

## 2023-05-26 MED ORDER — BUPROPION HCL ER (SR) 150 MG PO TB12: 150.0000 mg | ORAL_TABLET | Freq: Every day | ORAL | 1 refills | Status: DC

## 2023-05-26 MED ORDER — METFORMIN HCL 500 MG PO TABS
ORAL_TABLET | ORAL | 1 refills | Status: DC
Start: 2023-05-26 — End: 2023-07-07

## 2023-05-26 NOTE — Progress Notes (Incomplete)
 Darlene Mccormick, D.O.  ABFM, ABOM Specializing in Clinical Bariatric Medicine  Office located at: [address] [place], [place]  [address]   Assessment and Plan:   Medications Discontinued During This Encounter  Medication Reason   buPROPion ER (WELLBUTRIN SR) 100 MG 12 hr tablet Dose change   Vitamin D, Ergocalciferol, (DRISDOL) 1.25 MG (50000 UNIT) CAPS capsule Reorder     Meds ordered this encounter  Medications   buPROPion (WELLBUTRIN SR) 150 MG 12 hr tablet    Sig: Take 1 tablet (150 mg total) by mouth daily.    Dispense:  30 tablet    Refill:  1   Vitamin D, Ergocalciferol, (DRISDOL) 1.25 MG (50000 UNIT) CAPS capsule    Sig: Take 1 capsule (50,000 Units total) by mouth every 7 (seven) days.    Dispense:  4 capsule    Refill:  1   metFORMIN (GLUCOPHAGE) 500 MG tablet    Sig: 1/2 tab po with breakfast and lunch daily    Dispense:  30 tablet    Refill:  1    60 d supply;  ** OV for RF **   Do not send RF request     FOR THE DISEASE OF OBESITY: BMI 40.0-44.9, adult (HCC) current bmi 44.26 Obesity - starting BMI 42.89 Assessment & Plan: Since last office visit on 04/22/2023 patient's  Muscle mass has decreased by 2.6 lbs. Fat mass has increased by 7.2 lbs. Total body water has increased by 4 lbs.  Counseling done on how various foods will affect these numbers and how to maximize success  Total lbs lost to date: +9 lbs Total weight loss percentage to date: +3.19%    Recommended Dietary Goals [name] is currently in the action stage of change. As such, her goal is to continue weight management plan.  She has agreed to: Follow CAT 3 MP with L options and/or journal current plan   Behavioral Intervention We discussed the following today: avoiding skipping meals and work on meal planning and preparation, making yourself a priority,   Additional resources provided today: handout on CAT 3 meal plan and Handout on CAT 3-4 lunch options  Evidence-based  interventions for health behavior change were utilized today including the discussion of self monitoring techniques, problem-solving barriers and SMART goal setting techniques.   Regarding patient's less desirable eating habits and patterns, we employed the technique of small changes.   Pt will specifically work on: n/a   Recommended Physical Activity Goals Jerra has been advised to work up to 150 minutes of moderate intensity aerobic activity a week and strengthening exercises 2-3 times per week for cardiovascular health, weight loss maintenance and preservation of muscle mass.   She has agreed to :  Introduce weight lifting in exercise routine   Pharmacotherapy We both agreed to : Increase Wellbutrin SR to 150 mg daily   FOR ASSOCIATED CONDITIONS ADDRESSED TODAY:  Prediabetes Assessment & Plan: Lab Results  Component Value Date   HGBA1C 6.4 (H) 04/28/2023   HGBA1C 6.4 (H) 01/28/2023   HGBA1C 6.2 (H) 09/09/2022   INSULIN 21.7 09/28/2022   INSULIN 21.1 04/28/2022   INSULIN 23.8 11/03/2021   Most recent labs above.    A1c still on the cusp of diabetes Diet and exercise Tried Metformin in the past, made her feel foggy Start low dose Metformin 0.5 tab with B and L lunch daily  Discussed risks/benefits/side effects with pt       Other depression, with emotional  eating Assessment & Plan:     Wellbutrin SR is not giving any SE Pt finds it difficult to prioritize self care d/t stress in life Occasionally skips lunch Finds herself eating to self soothe Been on current dose for 2 months  Will increase dose to 150 mg    Vitamin D deficiency Assessment & Plan:    ERGO 50K weekly Will refill Occassionally misses doses -- but been more consistent recently    Low calcium levels Assessment & Plan: Lab Results  Component Value Date   Glucose 125 (H) 04/28/2023   CREATININE 0.53 (L) 04/28/2023   BUN 8 04/28/2023   NA 140 04/28/2023   K 4.8 04/28/2023   CL  103 04/28/2023   CO2 21 04/28/2023   CALCIUM 8.5 (L) 04/28/2023    Calcium is low at 8.5 Pt has been skipping vit d doses occasiaonally -- likely reson why calcium is low Will focus on taking vit d supplements and calcium rich foods  Follow up:   No follow-ups on file. She was informed of the importance of frequent follow up visits to maximize her success with intensive lifestyle modifications for her multiple health conditions.  Subjective:   Chief complaint: Obesity Darlene Mccormick is here to discuss her progress with her obesity treatment plan. She is keeping a food journal and adhering to recommended goals of 1800 calories and 90+ protein and states she is following her eating plan approximately 25% of the time. She states she is walking 30 minutes 5 days per week.  Interval History:  Darlene Mccormick is here for a follow up office visit. Since last OV on ,  ***    Lack of focus Struggles with consistency Gets really busy at work and lose track of time    Pharmacotherapy for weight loss: She is currently taking Wellbutrin SR 100 mg daily.   Review of Systems:  Pertinent positives were addressed with patient today.  Reviewed by clinician on day of visit: allergies, medications, problem list, medical history, surgical history, family history, social history, and previous encounter notes.  Weight Summary and Biometrics   Weight Lost Since Last Visit: 0 lb  Weight Gained Since Last Visit: 4 lb   Vitals Temp: 98.1 F (36.7 C) BP: 136/84 Pulse Rate: 92 SpO2: 96 %   Anthropometric Measurements Height: 5\' 8"  (1.727 m) Weight: 291 lb (132 kg) BMI (Calculated): 44.26 Weight at Last Visit: 287 Weight Lost Since Last Visit: 0 lb Weight Gained Since Last Visit: 4 lb Starting Weight: 282 lb Total Weight Loss (lbs): 0 lb (0 kg)   Body Composition  Body Fat %: 50.2 % Fat Mass (lbs): 146.4 lbs Muscle Mass (lbs): 137.8 lbs Total Body Water (lbs): 107 lbs Visceral Fat  Rating : 16   Other Clinical Data Fasting: yes Labs: no Today's Visit #: 16 Starting Date: 05/13/21    Objective:   PHYSICAL EXAM: Blood pressure 136/84, pulse 92, temperature 98.1 F (36.7 C), height 5\' 8"  (1.727 m), weight 291 lb (132 kg), last menstrual period 05/06/2023, SpO2 96%. Body mass index is 44.25 kg/m.  General: she is overweight, cooperative and in no acute distress. PSYCH: Has normal mood, affect and thought process.   HEENT: EOMI, sclerae are anicteric. Lungs: Normal breathing effort, no conversational dyspnea. Extremities: Moves * 4 Neurologic: A and O * 3, good insight  DIAGNOSTIC DATA REVIEWED: BMET    Component Value Date/Time   NA 140 04/28/2023 0757   K 4.8 04/28/2023 0757  CL 103 04/28/2023 0757   CO2 21 04/28/2023 0757   GLUCOSE 125 (H) 04/28/2023 0757   GLUCOSE 115 (H) 08/20/2022 2325   BUN 8 04/28/2023 0757   CREATININE 0.53 (L) 04/28/2023 0757   CREATININE 0.66 11/25/2018 1600   CALCIUM 8.5 (L) 04/28/2023 0757   GFRNONAA >60 08/20/2022 2325   GFRAA 119 03/06/2020 0904   Lab Results  Component Value Date   HGBA1C 6.4 (H) 04/28/2023   HGBA1C 5.6 11/26/2017   Lab Results  Component Value Date   INSULIN 21.7 09/28/2022   INSULIN 16.2 05/03/2019   Lab Results  Component Value Date   TSH 1.420 04/28/2022   CBC    Component Value Date/Time   WBC 8.9 08/20/2022 2325   RBC 4.51 08/20/2022 2325   HGB 12.9 08/20/2022 2325   HGB 12.6 03/06/2020 0904   HCT 39.2 08/20/2022 2325   HCT 37.4 03/06/2020 0904   PLT 387 08/20/2022 2325   PLT 358 03/06/2020 0904   MCV 86.9 08/20/2022 2325   MCV 87 03/06/2020 0904   MCH 28.6 08/20/2022 2325   MCHC 32.9 08/20/2022 2325   RDW 12.7 08/20/2022 2325   RDW 12.7 03/06/2020 0904   Iron Studies    Component Value Date/Time   IRON 62 01/05/2019 0933   FERRITIN 57.7 01/05/2019 0933   Lipid Panel     Component Value Date/Time   CHOL 247 (H) 09/28/2022 1134   CHOL 246 (H) 01/05/2019 0933    TRIG 313 (H) 09/28/2022 1134   TRIG 296 (H) 01/05/2019 0933   HDL 36 (L) 09/28/2022 1134   CHOLHDL 6.9 (H) 09/28/2022 1134   CHOLHDL 6 12/27/2020 1004   VLDL 50.0 (H) 12/27/2020 1004   LDLCALC 153 (H) 09/28/2022 1134   LDLCALC 193 (H) 11/25/2018 1600   LDLDIRECT 170.0 12/27/2020 1004   Hepatic Function Panel     Component Value Date/Time   PROT 6.7 01/28/2023 0855   ALBUMIN 4.2 01/28/2023 0855   AST 36 01/28/2023 0855   ALT 57 (H) 01/28/2023 0855   ALKPHOS 77 01/28/2023 0855   BILITOT 0.8 01/28/2023 0855   BILIDIR <0.1 08/21/2022 0208   IBILI NOT CALCULATED 08/21/2022 0208      Component Value Date/Time   TSH 1.420 04/28/2022 0958   TSH 1.41 12/27/2020 1004   Nutritional Lab Results  Component Value Date   VD25OH 42.1 01/28/2023   VD25OH 35.7 09/09/2022   VD25OH 34.2 04/28/2022    Attestations:   I, Camryn Mix, acting as a Stage manager for [employer], DO., have compiled all relevant documentation for today's office visit on behalf of Thomasene Lot, DO, while in the presence of [employer], DO.  Reviewed by clinician on day of visit: allergies, medications, problem list, medical history, surgical history, family history, social history, and previous encounter notes pertinent to patient's obesity diagnosis. I have spent 45 minutes in the care of the patient today including: preparing to see patient (e.g. review and interpretation of tests, old notes ), obtaining and/or reviewing separately obtained history, performing a medically appropriate examination or evaluation, counseling and educating the patient, ordering medications, test or procedures, documenting clinical information in the electronic or other health care record, and independently interpreting results and communicating results to the patient, family, or caregiver   I have reviewed the above documentation for accuracy and completeness, and I agree with the above. Darlene Mccormick, D.O.  The 21st  Century Cures Act was signed into law in 2016 which includes  the topic of electronic health records.  This provides immediate access to information in MyChart.  This includes consultation notes, operative notes, office notes, lab results and pathology reports.  If you have any questions about what you read please let us know at your next visit so we can discuss your concerns and take corrective action if need be.  We are right here with you.

## 2023-05-28 NOTE — Progress Notes (Signed)
 [name], D.O.  ABFM, ABOM Specializing in Clinical Bariatric Medicine  Office located at: [address] [place], [place]  [address]   Assessment and Plan:    Medications Discontinued During This Encounter  Medication Reason   buPROPion ER (WELLBUTRIN SR) 100 MG 12 hr tablet Dose change   Vitamin D, Ergocalciferol, (DRISDOL) 1.25 MG (50000 UNIT) CAPS capsule Reorder     Meds ordered this encounter  Medications   buPROPion (WELLBUTRIN SR) 150 MG 12 hr tablet    Sig: Take 1 tablet (150 mg total) by mouth daily.    Dispense:  30 tablet    Refill:  1   Vitamin D, Ergocalciferol, (DRISDOL) 1.25 MG (50000 UNIT) CAPS capsule    Sig: Take 1 capsule (50,000 Units total) by mouth every 7 (seven) days.    Dispense:  4 capsule    Refill:  1   metFORMIN (GLUCOPHAGE) 500 MG tablet    Sig: 1/2 tab po with breakfast and lunch daily    Dispense:  30 tablet    Refill:  1    60 d supply;  ** OV for RF **   Do not send RF request     FOR THE DISEASE OF OBESITY: BMI 40.0-44.9, adult (HCC) current bmi 44.26 Obesity - starting BMI 42.89 Assessment & Plan: Since last office visit on 04/22/2023 patient's  Muscle mass has decreased by 2.6 lbs. Fat mass has increased by 7.2 lbs. Total body water has increased by 4 lbs.  Counseling done on how various foods will affect these numbers and how to maximize success  Total lbs lost to date: +9 lbs Total weight loss percentage to date: +3.19%    Recommended Dietary Goals Darlene Mccormick is currently in the action stage of change. As such, her goal is to continue weight management plan.  She has agreed to: Follow CAT 3 MP with L options and/or journal current plan   Behavioral Intervention We discussed the following today: avoiding skipping meals and work on meal planning and preparation, making yourself a priority,   Additional resources provided today: handout on CAT 3 meal plan and Handout on CAT 3-4 lunch options  Evidence-based  interventions for health behavior change were utilized today including the discussion of self monitoring techniques, problem-solving barriers and SMART goal setting techniques.   Regarding patient's less desirable eating habits and patterns, we employed the technique of small changes.   Pt will specifically work on: n/a   Recommended Physical Activity Goals Darlene Mccormick has been advised to work up to 150 minutes of moderate intensity aerobic activity a week and strengthening exercises 2-3 times per week for cardiovascular health, weight loss maintenance and preservation of muscle mass.   She has agreed to :  Introduce weight lifting in exercise routine   Pharmacotherapy We both agreed to : Start low dose Metformin 0.5 tab with B and L lunch daily      FOR ASSOCIATED CONDITIONS ADDRESSED TODAY:  Prediabetes Assessment & Plan: Lab Results  Component Value Date   HGBA1C 6.4 (H) 04/28/2023   HGBA1C 6.4 (H) 01/28/2023   HGBA1C 6.2 (H) 09/09/2022   INSULIN 21.7 09/28/2022   INSULIN 21.1 04/28/2022   INSULIN 23.8 11/03/2021   Most recent labs above.  Pt's is still A1c on the cusp of diabetes. Diet and exercise controlled. Darlene Mccormick has tried Metformin in the past, but it made her feel "foggy". Start low dose Metformin 0.5 tab with B and L lunch daily. All questions  and concerns were answered thoroughly. Risks and benefits were discussed with pt. Will monitor condition closely and make changes if needed.   Relevant Orders: -     metFORMIN HCl; 1/2 tab po with breakfast and lunch daily  Dispense: 30 tablet; Refill: 1   Other depression, with emotional eating Assessment & Plan: Darlene Mccormick is taking Wellbutrin SR 100 mg daily for this condition, and has been on this dose for 2 months. She denies any adverse SE. However, pt reports finding it difficult to prioritize self care d/t stressors in her life. She admits occasionally skipping and eating to self soothe.  Doing more emotional eating rather than  eating when hungry.  R/B dose change vs new med d/c pt.   Due to recent inc stressors, she wishes to inc dose for now of wellbutrin Will increase dose to 150 mg daily today to help decrease emotional eating and overactive thinking. Reminded patient of the importance of following their prudent nutrition plan and how food can affect mood as well to support emotional wellbeing. We will continue to monitor closely.  Relevant Orders: -     buPROPion HCl ER (SR); Take 1 tablet (150 mg total) by mouth daily.  Dispense: 30 tablet; Refill: 1   Vitamin D deficiency Assessment & Plan:  treating Vit D deficiency with ERGO 50K weekly. She admits to occasionally missing doses, but has been consistent more recently. Reminded pt of the importance of continuously taking supplement as prescribed. Will continue to monitor this condition as it pertains to her weight loss journey.    Relevant Orders: -     Vitamin D (Ergocalciferol); Take 1 capsule (50,000 Units total) by mouth every 7 (seven) days.  Dispense: 4 capsule; Refill: 1   Low calcium levels Assessment & Plan: Lab Results  Component Value Date   Glucose 125 (H) 04/28/2023   CREATININE 0.53 (L) 04/28/2023   BUN 8 04/28/2023   NA 140 04/28/2023   K 4.8 04/28/2023   CL 103 04/28/2023   CO2 21 04/28/2023   CALCIUM 8.5 (L) 04/28/2023  Reviewed last obtained labs with pt. Informed pt that calcium levels are slightly below goal. Pt reports skipping Vit D doses which likely contributes to low calcium levels. Will focus on taking vit d supplements and calcium rich foods. Will recheck levels 3 months from last obtained labs to observe improvement.    Follow up:   Return in about 6 weeks (around 07/07/2023). She was informed of the importance of frequent follow up visits to maximize her success with intensive lifestyle modifications for her multiple health conditions.    Subjective:   Chief complaint: Obesity Darlene Mccormick is here to discuss her progress with  her obesity treatment plan. She is keeping a food journal and adhering to recommended goals of 1800 calories and 90+ protein and states she is following her eating plan approximately 25% of the time. She states she is walking 30 minutes 5 days per week.  Interval History:  Darlene Mccormick is here for a follow up office visit. Since last OV on 04/22/2023, Darlene Mccormick is up 4 lbs. She attributes this weight gain to a lack of focus and struggling with consistency. She reports getting very busy at work which causes her to lose track of time and accidentally miss meals.   Pharmacotherapy for weight loss: She is currently taking Wellbutrin SR 100 mg daily.   Review of Systems:  Pertinent positives were addressed with patient today.  Reviewed by clinician on day  of visit: allergies, medications, problem list, medical history, surgical history, family history, social history, and previous encounter notes.    Weight Summary and Biometrics   Weight Lost Since Last Visit: 0 lb   Weight Gained Since Last Visit: 4 lb     Vitals Temp: 98.1 F (36.7 C) BP: 136/84 Pulse Rate: 92 SpO2: 96 %     Anthropometric Measurements Height: 5\' 8"  (1.727 m) Weight: 291 lb (132 kg) BMI (Calculated): 44.26 Weight at Last Visit: 287 Weight Lost Since Last Visit: 0 lb Weight Gained Since Last Visit: 4 lb Starting Weight: 282 lb Total Weight Loss (lbs): 0 lb (0 kg)     Body Composition  Body Fat %: 50.2 % Fat Mass (lbs): 146.4 lbs Muscle Mass (lbs): 137.8 lbs Total Body Water (lbs): 107 lbs Visceral Fat Rating : 16     Other Clinical Data Fasting: yes Labs: no Today's Visit #: 16 Starting Date: 05/13/21    Objective:   PHYSICAL EXAM: Blood pressure 136/84, pulse 92, temperature 98.1 F (36.7 C), height 5\' 8"  (1.727 m), weight 291 lb (132 kg), last menstrual period 05/06/2023, SpO2 96%. Body mass index is 44.25 kg/m.  General: she is overweight, cooperative and in no acute  distress. PSYCH: Has normal mood, affect and thought process.   HEENT: EOMI, sclerae are anicteric. Lungs: Normal breathing effort, no conversational dyspnea. Extremities: Moves * 4 Neurologic: A and O * 3, good insight  DIAGNOSTIC DATA REVIEWED: BMET    Component Value Date/Time   NA 140 04/28/2023 0757   K 4.8 04/28/2023 0757   CL 103 04/28/2023 0757   CO2 21 04/28/2023 0757   GLUCOSE 125 (H) 04/28/2023 0757   GLUCOSE 115 (H) 08/20/2022 2325   BUN 8 04/28/2023 0757   CREATININE 0.53 (L) 04/28/2023 0757   CREATININE 0.66 11/25/2018 1600   CALCIUM 8.5 (L) 04/28/2023 0757   GFRNONAA >60 08/20/2022 2325   GFRAA 119 03/06/2020 0904   Lab Results  Component Value Date   HGBA1C 6.4 (H) 04/28/2023   HGBA1C 5.6 11/26/2017   Lab Results  Component Value Date   INSULIN 21.7 09/28/2022   INSULIN 16.2 05/03/2019   Lab Results  Component Value Date   TSH 1.420 04/28/2022   CBC    Component Value Date/Time   WBC 8.9 08/20/2022 2325   RBC 4.51 08/20/2022 2325   HGB 12.9 08/20/2022 2325   HGB 12.6 03/06/2020 0904   HCT 39.2 08/20/2022 2325   HCT 37.4 03/06/2020 0904   PLT 387 08/20/2022 2325   PLT 358 03/06/2020 0904   MCV 86.9 08/20/2022 2325   MCV 87 03/06/2020 0904   MCH 28.6 08/20/2022 2325   MCHC 32.9 08/20/2022 2325   RDW 12.7 08/20/2022 2325   RDW 12.7 03/06/2020 0904   Iron Studies    Component Value Date/Time   IRON 62 01/05/2019 0933   FERRITIN 57.7 01/05/2019 0933   Lipid Panel     Component Value Date/Time   CHOL 247 (H) 09/28/2022 1134   CHOL 246 (H) 01/05/2019 0933   TRIG 313 (H) 09/28/2022 1134   TRIG 296 (H) 01/05/2019 0933   HDL 36 (L) 09/28/2022 1134   CHOLHDL 6.9 (H) 09/28/2022 1134   CHOLHDL 6 12/27/2020 1004   VLDL 50.0 (H) 12/27/2020 1004   LDLCALC 153 (H) 09/28/2022 1134   LDLCALC 193 (H) 11/25/2018 1600   LDLDIRECT 170.0 12/27/2020 1004   Hepatic Function Panel     Component Value Date/Time  PROT 6.7 01/28/2023 0855   ALBUMIN  4.2 01/28/2023 0855   AST 36 01/28/2023 0855   ALT 57 (H) 01/28/2023 0855   ALKPHOS 77 01/28/2023 0855   BILITOT 0.8 01/28/2023 0855   BILIDIR <0.1 08/21/2022 0208   IBILI NOT CALCULATED 08/21/2022 0208      Component Value Date/Time   TSH 1.420 04/28/2022 0958   TSH 1.41 12/27/2020 1004   Nutritional Lab Results  Component Value Date   VD25OH 42.1 01/28/2023   VD25OH 35.7 09/09/2022   VD25OH 34.2 04/28/2022    Attestations:   I, Camryn Mix, acting as a Stage manager for [employer], DO., have compiled all relevant documentation for today's office visit on behalf of Thomasene Lot, DO, while in the presence of [employer], DO.  Reviewed by clinician on day of visit: allergies, medications, problem list, medical history, surgical history, family history, social history, and previous encounter notes pertinent to patient's obesity diagnosis. I have spent 56 minutes in the care of the patient today including: preparing to see patient (e.g. review and interpretation of tests, old notes ), obtaining and/or reviewing separately obtained history, performing a medically appropriate examination or evaluation, counseling and educating the patient, ordering medications, test or procedures, documenting clinical information in the electronic or other health care record, and independently interpreting results and communicating results to the patient, family, or caregiver   I have reviewed the above documentation for accuracy and completeness, and I agree with the above. [name], D.O.  The 21st Century Cures Act was signed into law in 2016 which includes the topic of electronic health records.  This provides immediate access to information in MyChart.  This includes consultation notes, operative notes, office notes, lab results and pathology reports.  If you have any questions about what you read please let us know at your next visit so we can discuss your concerns and take  corrective action if need be.  We are right here with you.

## 2023-06-15 NOTE — Telephone Encounter (Signed)
**Note De-Identified Caylan Chenard Obfuscation** 2nd Attempt: Ordering provider: Dr Mayford Knife Associated diagnoses: Obesity-E66.01 and HTN-I10  WatchPAT PA obtained on 06/15/2023 by Daequan Kozma, Lorelle Formosa, LPN. Authorization: Per the [facility] Provider Portal: [id number] Description Sleep study, unattended, simultaneous recording; heart rate, oxygen saturation, respiratory analysis (eg, by airflow or peripheral arterial tone), and sleep time Inquiry summary Notification/Prior Authorization not required for this service.  Patient notified of PIN ([id number]) on 06/15/2023 Darlene Mccormick Notification Method: phone-No answer so I left a message on the pts VM (Ok per [facility]) advising the pt of the WatchPAT One-HST device Pin # of "[phone]" and that she may proceed with her home sleep study as [employer] does not require a PA for this sleep study. I did advise in the message that if she has decided not to do the HST to please return the device back to Dr Norris Cross office in its original unopened box or she will be charged $100 for it. I left my name and the office phone number in the message.  Phone note routed to covering staff (triage for Dr Mayford Knife) for follow-up.

## 2023-06-15 NOTE — Telephone Encounter (Signed)
 Check for completion 4/1

## 2023-06-26 ENCOUNTER — Encounter (INDEPENDENT_AMBULATORY_CARE_PROVIDER_SITE_OTHER): Admitting: Cardiology

## 2023-06-26 DIAGNOSIS — G4733 Obstructive sleep apnea (adult) (pediatric): Secondary | ICD-10-CM | POA: Diagnosis not present

## 2023-06-29 ENCOUNTER — Ambulatory Visit: Attending: Cardiovascular Disease

## 2023-06-29 DIAGNOSIS — R079 Chest pain, unspecified: Secondary | ICD-10-CM

## 2023-06-29 NOTE — Procedures (Signed)
    SLEEP STUDY REPORT Patient Information Study Date: 06/26/2023 Patient Name: Darlene Mccormick Patient ID: [id number] Birth Date: Jan 09, 1976 Age: 48 Gender: Female BMI: 44.4 (W=293 lb, H=5' 8'') Stopbang: 4 Referring Physician: Armanda Magic, MD  TEST DESCRIPTION: Home sleep apnea testing was completed using the WatchPat, a Type 1 device, utilizing peripheral arterial tonometry (PAT), chest movement, actigraphy, pulse oximetry, pulse rate, body position and snore. AHI was calculated with apnea and hypopnea using valid sleep time as the denominator. RDI includes apneas, hypopneas, and RERAs. The data acquired and the scoring of sleep and all associated events were performed in accordance with the recommended standards and specifications as outlined in the AASM Manual for the Scoring of Sleep and Associated Events 2.2.0 (2015).  FINDINGS:  1. Severe Obstructive Sleep Apnea with AHI 30.9/hr.  2. No Central Sleep Apnea with pAHIc 1.2/hr.  3. Oxygen desaturations as low as 70%.  4. Severe snoring was present. O2 sats were < 88% for 40.3 min.  5. Total sleep time was 7 hrs and 26 min.  6. 26.1% of total sleep time was spent in REM sleep.  7. Normal sleep onset latency at 19 min.  8. Prolonged REM sleep onset latency at 115 min.  9. Total awakenings were 18. 10. Arrhythmia detection: None  DIAGNOSIS: Severe Obstructive Sleep Apnea (G47.33) Nocturnal Hypoxemia  RECOMMENDATIONS: 1. Clinical correlation of these findings is necessary. The decision to treat obstructive sleep apnea (OSA) is usually based on the presence of apnea symptoms or the presence of associated medical conditions such as Hypertension, Congestive Heart Failure, Atrial Fibrillation or Obesity. The most common symptoms of OSA are snoring, gasping for breath while sleeping, daytime sleepiness and fatigue.  2. Initiating apnea therapy is recommended given the presence of symptoms and/or associated conditions. Recommend  proceeding with one of the following:   a. Auto-CPAP therapy with a pressure range of 5-20cm H2O.   b. An oral appliance (OA) that can be obtained from certain dentists with expertise in sleep medicine. These are primarily of use in non-obese patients with mild and moderate disease.   c. An ENT consultation which may be useful to look for specific causes of obstruction and possible treatment options.   d. If patient is intolerant to PAP therapy, consider referral to ENT for evaluation for hypoglossal nerve stimulator.  3. Close follow-up is necessary to ensure success with CPAP or oral appliance therapy for maximum benefit .  4. A follow-up oximetry study on CPAP is recommended to assess the adequacy of therapy and determine the need for supplemental oxygen or the potential need for Bi-level therapy. An arterial blood gas to determine the adequacy of baseline ventilation and oxygenation should also be considered.  5. Healthy sleep recommendations include: adequate nightly sleep (normal 7-9 hrs/night), avoidance of caffeine after noon and alcohol near bedtime, and maintaining a sleep environment that is cool, dark and quiet.  6. Weight loss for overweight patients is recommended. Even modest amounts of weight loss can significantly improve the severity of sleep apnea.  7. Snoring recommendations include: weight loss where appropriate, side sleeping, and avoidance of alcohol before bed.  8. Operation of motor vehicle should be avoided when sleepy.  Signature: Armanda Magic, MD; [facility]; Diplomat, American Board of Sleep Medicine Electronically Signed: 06/29/2023 7:07:11 PM

## 2023-06-30 ENCOUNTER — Telehealth: Payer: Self-pay

## 2023-06-30 ENCOUNTER — Telehealth: Payer: Self-pay | Admitting: Cardiology

## 2023-06-30 NOTE — Telephone Encounter (Addendum)
 Notified patient of sleep study results and recommendations. Patient is interested in oral device referral. Message sent to Dr Mayford Knife for advice.

## 2023-06-30 NOTE — Telephone Encounter (Signed)
 Patient returned RN Caroline's call.

## 2023-06-30 NOTE — Telephone Encounter (Signed)
-----   Message from Armanda Magic sent at 06/29/2023  7:09 PM EDT ----- Please let patient know that they have sleep apnea.  Recommend therapeutic CPAP titration for treatment of patient's sleep disordered breathing.

## 2023-06-30 NOTE — Telephone Encounter (Signed)
 Left voice message to call back 4/2

## 2023-07-07 ENCOUNTER — Ambulatory Visit (INDEPENDENT_AMBULATORY_CARE_PROVIDER_SITE_OTHER): Payer: 59 | Admitting: Family Medicine

## 2023-07-07 ENCOUNTER — Encounter (INDEPENDENT_AMBULATORY_CARE_PROVIDER_SITE_OTHER): Payer: Self-pay | Admitting: Family Medicine

## 2023-07-07 VITALS — BP 131/83 | HR 91 | Temp 97.7°F | Ht 68.0 in | Wt 291.6 lb

## 2023-07-07 DIAGNOSIS — E669 Obesity, unspecified: Secondary | ICD-10-CM

## 2023-07-07 DIAGNOSIS — Z6841 Body Mass Index (BMI) 40.0 and over, adult: Secondary | ICD-10-CM

## 2023-07-07 DIAGNOSIS — E559 Vitamin D deficiency, unspecified: Secondary | ICD-10-CM

## 2023-07-07 DIAGNOSIS — F5089 Other specified eating disorder: Secondary | ICD-10-CM | POA: Diagnosis not present

## 2023-07-07 DIAGNOSIS — F3289 Other specified depressive episodes: Secondary | ICD-10-CM

## 2023-07-07 DIAGNOSIS — R7303 Prediabetes: Secondary | ICD-10-CM

## 2023-07-07 MED ORDER — METFORMIN HCL 500 MG PO TABS: ORAL_TABLET | ORAL | 1 refills | Status: DC

## 2023-07-07 MED ORDER — VITAMIN D (ERGOCALCIFEROL) 1.25 MG (50000 UNIT) PO CAPS: 50000.0000 [IU] | ORAL_CAPSULE | ORAL | 1 refills | Status: DC

## 2023-07-07 NOTE — Progress Notes (Signed)
 Darlene Mccormick, D.O.  ABFM, ABOM Specializing in Clinical Bariatric Medicine  Office located at: [address] [place], [place]  [address]   Assessment and Plan:  No orders of the defined types were placed in this encounter.  Medications Discontinued During This Encounter  Medication Reason   benzonatate (TESSALON PERLES) 100 MG capsule Completed Course   Vitamin D, Ergocalciferol, (DRISDOL) 1.25 MG (50000 UNIT) CAPS capsule Reorder   metFORMIN (GLUCOPHAGE) 500 MG tablet Reorder    Meds ordered this encounter  Medications   metFORMIN (GLUCOPHAGE) 500 MG tablet    Sig: 1 tab po with breakfast and lunch daily    Dispense:  60 tablet    Refill:  1    60 d supply;  ** OV for RF **   Do not send RF request   Vitamin D, Ergocalciferol, (DRISDOL) 1.25 MG (50000 UNIT) CAPS capsule    Sig: Take 1 capsule (50,000 Units total) by mouth every 7 (seven) days.    Dispense:  4 capsule    Refill:  1    Next OV will check CMP, Vit D, A1c, fasting insulin, and FLP.  FOR THE DISEASE OF OBESITY:  BMI 40.0-44.9, adult (HCC) current bmi 44.35 Obesity - starting BMI 42.89 Assessment & Plan: Since last office visit on 05/26/2023, patient's muscle mass has decreased by 1.8 lbs. Fat mass has increased by 2 lbs. Total body water has increased by 1.2 lbs.  Counseling done on how various foods will affect these numbers and how to maximize success  Total lbs lost to date: +9 lbs Total weight loss percentage to date: +3.19%    Recommended Dietary Goals Darlene Mccormick is currently in the action stage of change. As such, her goal is to continue weight management plan.  She has agreed to: continue current plan   Behavioral Intervention We discussed the following today: decreasing simple carbohydrates , work on tracking and journaling calories using tracking application, continue to work on implementation of reduced calorie nutritional plan, continue to practice mindfulness when eating, and staying on  track while traveling and vacationing  Additional resources provided today: Handout on Food Journaling Log and Handout on Common Characteristics of Successful Weight Losers,  Evidence-based interventions for health behavior change were utilized today including the discussion of self monitoring techniques, problem-solving barriers and SMART goal setting techniques.   Regarding patient's less desirable eating habits and patterns, we employed the technique of small changes.     Recommended Physical Activity Goals [name] has been advised to work up to 150 minutes of moderate intensity aerobic activity a week and strengthening exercises 2-3 times per week for cardiovascular health, weight loss maintenance and preservation of muscle mass.   She has agreed to :  Continue current level of physical activity    Pharmacotherapy We both agreed to : continue with nutritional and behavioral strategies and current medication regimen   FOR ASSOCIATED CONDITIONS ADDRESSED TODAY:  Vitamin D deficiency Assessment & Plan: Darlene Mccormick takes ERGO 50,000 units once weekly. Tolerating well, no SE. No acute concerns today in this regard. Continue current supplementation regimen.   Relevant Orders: -     Vitamin D (Ergocalciferol); Take 1 capsule (50,000 Units total) by mouth every 7 (seven) days.  Dispense: 4 capsule; Refill: 1   Prediabetes Assessment & Plan: Darlene Mccormick was prescribed Metformin 500 mg 0.5 tablet twice daily, she is taking one tablet once daily with breakfast. Pt started this medication during LOV. Hunger/cravings well controlled. She denies any  adverse SE including GI upset. Darlene Mccormick recognizes a positive difference in her feelings about food. Will increase dose to one tablet twice daily as this is only a 12-hour medication. Pt is agreeable to increase in dose. Reminded Brady of all associated risks/benefits of Metformin. Continue following prudent nutritional plan. Will continue to monitor condition.    Relevant Orders: -     metFORMIN HCl; 1 tab po with breakfast and lunch daily  Dispense: 60 tablet; Refill: 1   Other depression, with emotional eating Assessment & Plan: Pt is taking Wellbutrin SR 100 mg twice daily. During LOV, dose was increased to 150 mg once daily. Pt has not started increased dose yet because PCP prescribed 100 mg twice daily and pt prefers to complete that medication first. Tolerating well, no adverse SE. Darlene Mccormick states she only has two more weeks of 100 mg and will start 150 mg following completion of current dose. Emotional eating under well control. Will monitor condition closely alongside PCP.   Follow up:   Return in about 4 weeks (around 08/04/2023). She was informed of the importance of frequent follow up visits to maximize her success with intensive lifestyle modifications for her multiple health conditions.  Subjective:   Chief complaint: Obesity Darlene Mccormick is here to discuss her progress with her obesity treatment plan. She is keeping a food journal and adhering to recommended goals of 1800 calories and 90+ protein using the CAT 3 MP with L options as a guide and states she is following her eating plan approximately 75% of the time. She states she is walking or swimming 30 minutes 3 days per week.  Interval History:  Therma Lasure is here for a follow up office visit. Since last OV on 05/26/2023, Darlene Mccormick's weight has not changed. She has been exercising more and increasing protein intake. Per pt, she is getting 150g of protein most days. Darlene Mccormick is journaling 50% of the time using [employer]. However, she has been focused more on food prep. Additionally, pt has a lot of upcoming traveling including work trip to [place] and vacation to [place] in June.  Pharmacotherapy for weight loss: She is currently taking Wellbutrin SR 150 mg daily and Metformin 500 mg 0.5 tablet twice daily.   Review of Systems:  Pertinent positives were addressed with patient  today.  Reviewed by clinician on day of visit: allergies, medications, problem list, medical history, surgical history, family history, social history, and previous encounter notes.  Weight Summary and Biometrics   Weight Lost Since Last Visit: 0lb  Weight Gained Since Last Visit: 0lb   Vitals Temp: 97.7 F (36.5 C) BP: 131/83 Pulse Rate: 91 SpO2: 94 %   Anthropometric Measurements Height: 5\' 8"  (1.727 m) Weight: 291 lb 9.6 oz (132.3 kg) BMI (Calculated): 44.35 Weight at Last Visit: 291 lb Weight Lost Since Last Visit: 0lb Weight Gained Since Last Visit: 0lb Starting Weight: 282 lb Total Weight Loss (lbs): 0 lb (0 kg)   Body Composition  Body Fat %: 50.9 % Fat Mass (lbs): 148.4 lbs Muscle Mass (lbs): 136 lbs Total Body Water (lbs): 108.2 lbs Visceral Fat Rating : 16   Other Clinical Data Fasting: No Labs: No Today's Visit #: 17 Starting Date: 05/13/21    Objective:   PHYSICAL EXAM: Blood pressure 131/83, pulse 91, temperature 97.7 F (36.5 C), height 5\' 8"  (1.727 m), weight 291 lb 9.6 oz (132.3 kg), SpO2 94%. Body mass index is 44.34 kg/m.  General: she is overweight, cooperative and in  no acute distress. PSYCH: Has normal mood, affect and thought process.   HEENT: EOMI, sclerae are anicteric. Lungs: Normal breathing effort, no conversational dyspnea. Extremities: Moves * 4 Neurologic: A and O * 3, good insight  DIAGNOSTIC DATA REVIEWED: BMET    Component Value Date/Time   NA 140 04/28/2023 0757   K 4.8 04/28/2023 0757   CL 103 04/28/2023 0757   CO2 21 04/28/2023 0757   GLUCOSE 125 (H) 04/28/2023 0757   GLUCOSE 115 (H) 08/20/2022 2325   BUN 8 04/28/2023 0757   CREATININE 0.53 (L) 04/28/2023 0757   CREATININE 0.66 11/25/2018 1600   CALCIUM 8.5 (L) 04/28/2023 0757   GFRNONAA >60 08/20/2022 2325   GFRAA 119 03/06/2020 0904   Lab Results  Component Value Date   HGBA1C 6.4 (H) 04/28/2023   HGBA1C 5.6 11/26/2017   Lab Results  Component  Value Date   INSULIN 21.7 09/28/2022   INSULIN 16.2 05/03/2019   Lab Results  Component Value Date   TSH 1.420 04/28/2022   CBC    Component Value Date/Time   WBC 8.9 08/20/2022 2325   RBC 4.51 08/20/2022 2325   HGB 12.9 08/20/2022 2325   HGB 12.6 03/06/2020 0904   HCT 39.2 08/20/2022 2325   HCT 37.4 03/06/2020 0904   PLT 387 08/20/2022 2325   PLT 358 03/06/2020 0904   MCV 86.9 08/20/2022 2325   MCV 87 03/06/2020 0904   MCH 28.6 08/20/2022 2325   MCHC 32.9 08/20/2022 2325   RDW 12.7 08/20/2022 2325   RDW 12.7 03/06/2020 0904   Iron Studies    Component Value Date/Time   IRON 62 01/05/2019 0933   FERRITIN 57.7 01/05/2019 0933   Lipid Panel     Component Value Date/Time   CHOL 247 (H) 09/28/2022 1134   CHOL 246 (H) 01/05/2019 0933   TRIG 313 (H) 09/28/2022 1134   TRIG 296 (H) 01/05/2019 0933   HDL 36 (L) 09/28/2022 1134   CHOLHDL 6.9 (H) 09/28/2022 1134   CHOLHDL 6 12/27/2020 1004   VLDL 50.0 (H) 12/27/2020 1004   LDLCALC 153 (H) 09/28/2022 1134   LDLCALC 193 (H) 11/25/2018 1600   LDLDIRECT 170.0 12/27/2020 1004   Hepatic Function Panel     Component Value Date/Time   PROT 6.7 01/28/2023 0855   ALBUMIN 4.2 01/28/2023 0855   AST 36 01/28/2023 0855   ALT 57 (H) 01/28/2023 0855   ALKPHOS 77 01/28/2023 0855   BILITOT 0.8 01/28/2023 0855   BILIDIR <0.1 08/21/2022 0208   IBILI NOT CALCULATED 08/21/2022 0208      Component Value Date/Time   TSH 1.420 04/28/2022 0958   TSH 1.41 12/27/2020 1004   Nutritional Lab Results  Component Value Date   VD25OH 42.1 01/28/2023   VD25OH 35.7 09/09/2022   VD25OH 34.2 04/28/2022    Attestations:   I, Camryn Mix, acting as a Stage manager for [employer], DO., have compiled all relevant documentation for today's office visit on behalf of Marceil Sensor, DO, while in the presence of [employer], DO.  I have reviewed the above documentation for accuracy and completeness, and I agree with the above. Darlene Mccormick, D.O.  The 21st Century Cures Act was signed into law in 2016 which includes the topic of electronic health records.  This provides immediate access to information in MyChart.  This includes consultation notes, operative notes, office notes, lab results and pathology reports.  If you have any questions about what you read please let  us  know at your next visit so we can discuss your concerns and take corrective action if need be.  We are right here with you.

## 2023-07-21 NOTE — Telephone Encounter (Signed)
 Left voice message to call back 4/23

## 2023-07-28 NOTE — Telephone Encounter (Signed)
 Left voice message to call back 4/30

## 2023-08-02 NOTE — Telephone Encounter (Signed)
 Left voice message to call back 5/5

## 2023-08-05 NOTE — Telephone Encounter (Signed)
 Spoke with pt regarding an ENT referral. Pt stated she already has an appointment with an ENT coming up and has not been experiencing drainage lately. Pt also stated she would be interested in a travel CPAP as she travels all over the world and needs something portable. She is also unsure if she should start with a CPAP if she may be in places without electricity to use for the machine. Pt is wondering if there are any other options for her. Pt was told the information provided would be sent to sleep team and Dr. Micael Adas for suggestions. Pt verbalized understanding. All questions if any were answered.

## 2023-08-05 NOTE — Telephone Encounter (Signed)
 Spoke with pt regarding her itamar home sleep study. Pt stated she has completed the study and received the results.

## 2023-08-06 ENCOUNTER — Other Ambulatory Visit: Payer: Self-pay | Admitting: Family Medicine

## 2023-08-06 ENCOUNTER — Telehealth: Payer: Self-pay

## 2023-08-06 DIAGNOSIS — F3289 Other specified depressive episodes: Secondary | ICD-10-CM

## 2023-08-06 DIAGNOSIS — F320 Major depressive disorder, single episode, mild: Secondary | ICD-10-CM

## 2023-08-06 NOTE — Telephone Encounter (Signed)
 See telephone note from 5/8

## 2023-08-06 NOTE — Telephone Encounter (Signed)
 Notified patient, per Dr. Micael Adas, an Inspire device would be best option but she has to have tried CPAP for 6 months first. Patient stated she has an upcoming appointment with ENT and will call sleep coordinator back with decision about CPAP Therapy after ENT visit.

## 2023-08-11 ENCOUNTER — Ambulatory Visit (INDEPENDENT_AMBULATORY_CARE_PROVIDER_SITE_OTHER): Admitting: Family Medicine

## 2023-08-12 NOTE — Telephone Encounter (Signed)
 Spoke with pt regarding CPAP and Inspire device. Pt has an appointment with an ENT tomorrow and would like to speak to him before deciding whether to go forward with the CPAP. Pt plans to let us  know if she decides to go forward with it. Pt verbalized understanding. All questions if any were answered.

## 2023-08-25 ENCOUNTER — Telehealth: Payer: Self-pay | Admitting: Cardiology

## 2023-08-25 DIAGNOSIS — G4733 Obstructive sleep apnea (adult) (pediatric): Secondary | ICD-10-CM

## 2023-08-25 NOTE — Telephone Encounter (Signed)
 Patient stated she is returning call from RN Deadra Everts to report on result of visit with ENT.

## 2023-08-25 NOTE — Telephone Encounter (Signed)
 Attempted to contact patient for further clarification of need. Will forward to sleep team since they have been discussing follow up with patient after ENT visit

## 2023-08-26 NOTE — Telephone Encounter (Signed)
 Spoke with patient and she states she did see ENT. She does have allergies but the ENT provider stated that would only change her score by 5 points and she would still have sleep apnea. She would like to move forward with getting a machine. She is going out the country on Sunday and would like to know what ger next steps are.

## 2023-08-26 NOTE — Telephone Encounter (Signed)
 Patient is returning call.

## 2023-09-27 ENCOUNTER — Ambulatory Visit: Payer: Self-pay

## 2023-09-27 NOTE — Telephone Encounter (Signed)
 Noted- ok to close.

## 2023-09-27 NOTE — Telephone Encounter (Signed)
 FYI Only or Action Required?: FYI only for provider.  Patient was last seen in primary care on 07/07/2023 by Midge Sober, DO. Called Nurse Triage reporting Insect Bite. Symptoms began several days ago. Interventions attempted: Rest, hydration, or home remedies. Symptoms are: gradually worsening.  Triage Disposition: See Physician Within 24 Hours  Patient/caregiver understands and will follow disposition?: Yes  Copied from CRM [id number]. Topic: Appointments - Appointment Scheduling >> Sep 27, 2023  9:36 AM Tonda B wrote: Patient/patient representative is calling to schedule an appointment. Refer to attachments for appointment information.  Patient has an insect bite she went out the country and it is swelling with it Reason for Disposition  [1] Red or very tender (to touch) area AND [2] started over 24 hours after the bite  Answer Assessment - Initial Assessment Questions 1. TYPE of INSECT: What type of insect was it?      Unsure-maybe sand flees 2. ONSET: When did you get bitten?      Several days 3. LOCATION: Where is the insect bite located?      Unsre-husband providing information 4. REDNESS: Is the area red or pink? If Yes, ask: What size is area of redness? (inches or cm). When did the redness start?     'look bad 5. PAIN: Is there any pain? If Yes, ask: How bad is it?  (Scale 1-10; or mild, moderate, severe)     unsure    Husband is calling to request appointment, she is currently traveling, called husband before getting on plane to ask him to call and schedule appointment for bug bites, suspects sand flees. She states its bad. He has not other information. States she did not report any other symptoms or feeling poorly. Requesting appointment sometime this week'. Acute visit scheduled on 09/28/23, spouse will have patient return call once in the country.  Protocols used: Insect Bite-A-AH

## 2023-09-28 ENCOUNTER — Ambulatory Visit: Admitting: Family Medicine

## 2023-09-28 ENCOUNTER — Encounter: Payer: Self-pay | Admitting: Family Medicine

## 2023-09-28 VITALS — BP 118/80 | HR 79 | Temp 97.9°F | Wt 300.0 lb

## 2023-09-28 DIAGNOSIS — B86 Scabies: Secondary | ICD-10-CM

## 2023-09-28 MED ORDER — MALATHION 0.5 % EX LOTN: TOPICAL_LOTION | CUTANEOUS | 0 refills | Status: DC

## 2023-09-28 NOTE — Progress Notes (Signed)
   Subjective:    Patient ID: Darlene Mccormick, female    DOB: 1975-11-09, 48 y.o.   MRN: [id number]  HPI Here for 4 days of widespread itchy red spots on her skin. These mostly involve the neck, shoulders, and arms. She feels fine otherwise. She notes that she just returned to the [place]  from a trip to [place], where she spent a month with some college students. The day before these bumps appeared she spent the day on a [place] beach.    Review of Systems  Constitutional: Negative.   Respiratory: Negative.    Cardiovascular: Negative.   Skin:  Positive for rash.       Objective:   Physical Exam Constitutional:      Appearance: Normal appearance.   Cardiovascular:     Rate and Rhythm: Normal rate and regular rhythm.     Pulses: Normal pulses.     Heart sounds: Normal heart sounds.  Pulmonary:     Effort: Pulmonary effort is normal.     Breath sounds: Normal breath sounds.   Skin:    Comments: There are widespread tiny red papules over the neck, both shoulders, both arms, and both ankles and feet.    Neurological:     Mental Status: She is alert.           Assessment & Plan:  This is most likely scabies, though another possible etiology would be sand flea bites. Either way, we will treat with 2 applications of Ovide lotion 7 days apart. She can use Benadryl  for the itching. Recheck as needed. Garnette Olmsted, MD

## 2023-09-29 NOTE — Telephone Encounter (Signed)
 The patient has been notified of the result and verbalized understanding.  All questions (if any) were answered. Joshua Dalton Seip, CMA 09/29/2023 11:52 AM    Pre cert titration

## 2023-10-06 ENCOUNTER — Ambulatory Visit (INDEPENDENT_AMBULATORY_CARE_PROVIDER_SITE_OTHER): Admitting: Family Medicine

## 2023-10-20 ENCOUNTER — Encounter: Payer: Self-pay | Admitting: Cardiology

## 2023-10-21 ENCOUNTER — Telehealth: Payer: Self-pay

## 2023-10-21 NOTE — Telephone Encounter (Signed)
**Note De-Identified Jeoffrey Eleazer Obfuscation** I have started a CPAP Titration PA through the [facility] Provider Portal and it is currently pending. Tracking #: [id number]  I have advised the pt Darlene Mccormick her [facility] Message that I will contact her with [facility] determination on her CPAP Titration PA.

## 2023-10-21 NOTE — Telephone Encounter (Signed)
**Note De-Identified Danney Bungert Obfuscation** Per call from Arley from [facility] the pt has an approved CPAP Titration PA already on file that was approved from 10/29/2023 until 01/27/2024 Auth#: [id number]   Per Arley, he is cancelling the CPAP Titration that I started today (Tracking #: [id number]) as it is no longer needed.  I have made the pt aware of this approval

## 2023-10-28 ENCOUNTER — Encounter (INDEPENDENT_AMBULATORY_CARE_PROVIDER_SITE_OTHER): Payer: Self-pay | Admitting: Family Medicine

## 2023-10-28 ENCOUNTER — Ambulatory Visit (INDEPENDENT_AMBULATORY_CARE_PROVIDER_SITE_OTHER): Admitting: Family Medicine

## 2023-10-28 VITALS — BP 150/95 | HR 90 | Temp 98.7°F | Ht 68.0 in | Wt 297.0 lb

## 2023-10-28 DIAGNOSIS — F3289 Other specified depressive episodes: Secondary | ICD-10-CM | POA: Diagnosis not present

## 2023-10-28 DIAGNOSIS — E782 Mixed hyperlipidemia: Secondary | ICD-10-CM

## 2023-10-28 DIAGNOSIS — R7303 Prediabetes: Secondary | ICD-10-CM

## 2023-10-28 DIAGNOSIS — K760 Fatty (change of) liver, not elsewhere classified: Secondary | ICD-10-CM

## 2023-10-28 DIAGNOSIS — F5089 Other specified eating disorder: Secondary | ICD-10-CM | POA: Diagnosis not present

## 2023-10-28 DIAGNOSIS — R030 Elevated blood-pressure reading, without diagnosis of hypertension: Secondary | ICD-10-CM

## 2023-10-28 DIAGNOSIS — E559 Vitamin D deficiency, unspecified: Secondary | ICD-10-CM

## 2023-10-28 DIAGNOSIS — Z7189 Other specified counseling: Secondary | ICD-10-CM

## 2023-10-28 DIAGNOSIS — E669 Obesity, unspecified: Secondary | ICD-10-CM

## 2023-10-28 DIAGNOSIS — Z6841 Body Mass Index (BMI) 40.0 and over, adult: Secondary | ICD-10-CM

## 2023-10-28 MED ORDER — BUPROPION HCL ER (SR) 100 MG PO TB12
ORAL_TABLET | ORAL | 0 refills | Status: DC
Start: 2023-10-28 — End: 2023-11-05

## 2023-10-28 MED ORDER — VITAMIN D (ERGOCALCIFEROL) 1.25 MG (50000 UNIT) PO CAPS
50000.0000 [IU] | ORAL_CAPSULE | ORAL | 0 refills | Status: DC
Start: 2023-10-28 — End: 2023-11-05

## 2023-10-28 NOTE — Progress Notes (Signed)
 Darlene Mccormick Mccormick, D.O.  ABFM, ABOM Specializing in Clinical Bariatric Medicine  Office located at: [address] [place], [place]  [address]   Assessment and Plan:   Orders Placed This Encounter  Procedures   Comprehensive metabolic panel with GFR   VITAMIN D  25 Hydroxy (Vit-D Deficiency, Fractures)   Hemoglobin A1c   Insulin , random   Lipid panel   Lipid Panel With LDL/HDL Ratio   Medications Discontinued During This Encounter  Medication Reason   buPROPion  (WELLBUTRIN  SR) 150 MG 12 hr tablet Patient Preference   Vitamin D , Ergocalciferol , (DRISDOL ) 1.25 MG (50000 UNIT) CAPS capsule Reorder    Meds ordered this encounter  Medications   DISCONTD: buPROPion  ER (WELLBUTRIN  SR) 100 MG 12 hr tablet    Sig: 1 po q am with food    Dispense:  30 tablet    Refill:  0   DISCONTD: Vitamin D , Ergocalciferol , (DRISDOL ) 1.25 MG (50000 UNIT) CAPS capsule    Sig: Take 1 capsule (50,000 Units total) by mouth every 7 (seven) days.    Dispense:  4 capsule    Refill:  0     Labs obtained today (CMP w GFR, A1c, insulin , lipid panel, lipid panel with LDL/HDL ratio, and vit D) will be reviewed at next OV.    FOR THE DISEASE OF OBESITY: BMI 45.0-49.9, adult (HCC) current 45.16 Mixed hyperlipidemia Assessment & Plan: Since last office visit on 07/07/23 patient's muscle mass has increased by 0.4 lbs. Fat mass has increased by 5.4 lbs. Total body water has increased by 3.8 lbs. Counseling done on how various foods will affect these numbers and how to maximize success  Total lbs lost to date: +15 lbs Total weight loss percentage to date: +5.32 %   Recommended Dietary Goals Darlene Mccormick Mccormick is currently in the action stage of change. As such, her goal is to continue weight management plan.  She has agreed to: [employer] with target of 2,000 calories and 120+ g of protein per day.    Behavioral Intervention We extensively discussed the following today: increasing lean protein intake to  established goals, decreasing simple carbohydrates , increasing water intake , work on meal planning and preparation, work on tracking and journaling calories using tracking application, decreasing eating out or consumption of processed foods, and making healthy choices when eating convenient foods, planning for success, and staying on track while traveling and vacationing, benefits of journaling to help increase mindfulness, accountability, and awareness.   Additional resources provided today: None  Evidence-based interventions for health behavior change were utilized today including the discussion of self monitoring techniques, problem-solving barriers and SMART goal setting techniques.  Regarding patient's less desirable eating habits and patterns, we employed the technique of small changes.   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   She has agreed to: Continue current level of physical activity , Increase physical activity in their day and reduce sedentary time (increase NEAT)., and Increase the intensity, frequency or duration of aerobic exercises     Pharmacotherapy We both agreed to: Continue with current nutritional and behavioral strategies and restart Wellbutrin  for emotional eating (see below)     ASSOCIATED CONDITIONS ADDRESSED TODAY:  Encounter for medication  review and counseling Other depression, with emotional eating Assessment & Plan: Her Wellbutrin  was increased at LOV on 4/9 to 150 mg daily for emotional eating and overactive thinking. Last took in early June and never increased the dose. She reports still experiencing emotional eating. Pt reports today her  OBGYN took her off the estrogen patch and this has slightly improved her emotional eating.   Pt expressed interest in restarting Wellbutrin . Restart Wellbutrin  today. Risks/benefits reviewed. I recommend she start with taking once in the AM and if tolerating well we will discuss potentially increasing to BID if needed. Also encouraged pt to increase her protein intake and decrease her simple carb intake for better control of hunger/cravings and emotional eating. All questions and concerns were addressed and answered. Pt verbalized understanding/agreement to this plan.    Encounter for medication review and counseling Prediabetes Assessment & Plan: Lab Results  Component Value Date   HGBA1C 6.4 (H) 04/28/2023   HGBA1C 6.4 (H) 01/28/2023   HGBA1C 6.2 (H) 09/09/2022   INSULIN  21.7 09/28/2022   INSULIN  21.1 04/28/2022   INSULIN  23.8 11/03/2021    Pt not taking Metformin . In the past we have discussed poor control of prediabetes with high risk of progressing to diabetes. Her A1c has been at 6.4 the last 2 times it was checked in the past year.   I advise she not restart Metformin  at this time given she plans to restart Wellbutrin . Resuming two medications simultaneously ma make it challenging to identify the cause if SE occur. Will consider restarting Metformin  at a later date. Stressed the importance of prioritizing her own physical health and not focusing solely on weight loss aspect. Work on [employer] and regular exercise to promote healthy lifestyle and prevent progression of condition to diabetes. Will continue monitoring alongside PCP. Rechecking A1c and insulin  today. Will review results at next OV.    Metabolic dysfunction-associated steatotic liver disease (MASLD) Assessment & Plan: Reviewed prior labs. ALT has been elevated over for 2+ years. Visceral fat rating has increased from 16 to 17 in the last 3-4 months.   Educated on MASLD and fatty liver disease progressing to cirrhosis  if not well controlled. Focus on lean protein to promote improving muscle mass and shedding fat. Increase to moderate intensity exercises at a recommended 300-450 minutes per week to improve cardiovascular health, promote weight loss, and prevent progression of condition to cirrhosis. Rechecking CMP w GFR today. Will review results at next OV.    Mixed hyperlipidemia Assessment & Plan: Her TGs, LDL, and tot cholesterol has been elevated for the past 3+ years. Her HDL has also been below goal for 3+ years. Not currently on any pharmacologic treatment.   Extensive counseling on lifestyle modifications including regular exercise and dietary changes. Work on reducing saturated and trans fats, increasing protein intake, and decreasing simple carb intake, and limit consumption of fatty meats. Discuss with PCP about options of medication therapy. Pt verbalized understanding. Rechecking lipid panel with LDL/HDL ratio today. Will review results at next OV.     Elevated blood pressure reading Assessment & Plan: BP Readings from Last 3 Encounters:  10/28/23 (!) 150/95  09/28/23 118/80  07/07/23 131/83   BP not at goal today. Pt had BP of 145/95 at initial check and 150/95 when manually rechecked. Pt reports she has not previously had elevated BP readings.   Reviewed goal BP of 120/80 or less and discussed range of pre-HTN. 140/90 and above being considered HTN if chronically getting  readings in that range. Encouraged pt to monitor her BP at home. Work on following her heart healthy low-salt diet per her prescribed meal plan. Continue regular exercise. Will continue monitoring alongside PCP. Rechecking CMP with GFR today. Will review results at next OV.    Vitamin D  deficiency Assessment & Plan: Lab Results  Component Value Date   VD25OH 42.1 01/28/2023   VD25OH 35.7 09/09/2022   VD25OH 34.2 04/28/2022   Taking ERGO 50K once every 7-14 days. Tolerating well, no SE. Ideal vit D levels of 50-70  reviewed with pt. Continue with current supplementation regimen. Rechecking vit D levels today. Will review results at next OV.    Follow up:   Return in about 3 weeks (around 11/18/2023) for 3-4 week f/u. She was informed of the importance of frequent follow up visits to maximize her success with intensive lifestyle modifications for her multiple health conditions.  Subjective:   Chief complaint: Obesity Darlene Mccormick Mccormick is here to discuss her progress with her obesity treatment plan. She is on keeping a food journal and adhering to recommended goals of 1800 calories and 90+ g of protein using CAT 3 MP w L options as a guide and states she is following her eating plan approximately 50% of the time. She states she is walking 30 minutes 5 days per week.   Interval History:  Darlene Mccormick Mccormick is here for a follow up office visit. Since last OV on 07/07/23, she is up 6 lbs. Has gone on vacation twice since her LOV, including a work trip to [place] and a trip to [place] for the entire month of June. She was not following her meal plan as closely while on vacation. Reports eating out frequently.    Pharmacotherapy that aid with weight loss: She is currently taking no anti-obesity medication.    Review of Systems:  Pertinent positives were addressed with patient today.  Reviewed by clinician on day of visit: allergies, medications, problem list, medical history, surgical history, family history, social history, and previous encounter notes.  Weight Summary and Biometrics   Weight Lost Since Last Visit: 0lb  Weight Gained Since Last Visit: 6lb    Vitals Temp: 98.7 F (37.1 C) BP: (!) 150/95 Pulse Rate: 90 SpO2: 97 %   Anthropometric Measurements Height: 5' 8 (1.727 m) Weight: 297 lb (134.7 kg) BMI (Calculated): 45.17 Weight at Last Visit: 291lb Weight Lost Since Last Visit: 0lb Weight Gained Since Last Visit: 6lb Starting Weight: 282lb Total Weight Loss (lbs): 0 lb (0  kg)   Body Composition  Body Fat %: 51.7 % Fat Mass (lbs): 153.8 lbs Muscle Mass (lbs): 136.4 lbs Total Body Water (lbs): 112 lbs Visceral Fat Rating : 17   Other Clinical Data Fasting: Yes Labs: Yes Today's Visit #: 18 Starting Date: 05/13/21 Comments: Cat 3    Objective:   PHYSICAL EXAM: Blood pressure (!) 150/95, pulse 90, temperature 98.7 F (37.1 C), height 5' 8 (1.727 m), weight 297 lb (134.7 kg), SpO2 97%. Body mass index is 45.16 kg/m.  General: she is overweight, cooperative and in no acute distress. PSYCH: Has normal mood, affect and thought process.   HEENT: EOMI, sclerae are anicteric. Lungs: Normal breathing effort, no conversational dyspnea. Extremities: Moves * 4 Neurologic: A and O * 3, good insight  DIAGNOSTIC DATA REVIEWED: BMET    Component Value Date/Time   NA 140 04/28/2023 0757   K 4.8 04/28/2023 0757   CL 103 04/28/2023 0757   CO2 21 04/28/2023  0757   GLUCOSE 125 (H) 04/28/2023 0757   GLUCOSE 115 (H) 08/20/2022 2325   BUN 8 04/28/2023 0757   CREATININE 0.53 (L) 04/28/2023 0757   CREATININE 0.66 11/25/2018 1600   CALCIUM 8.5 (L) 04/28/2023 0757   GFRNONAA >60 08/20/2022 2325   GFRAA 119 03/06/2020 0904   Lab Results  Component Value Date   HGBA1C 6.4 (H) 04/28/2023   HGBA1C 5.6 11/26/2017   Lab Results  Component Value Date   INSULIN  21.7 09/28/2022   INSULIN  16.2 05/03/2019   Lab Results  Component Value Date   TSH 1.420 04/28/2022   CBC    Component Value Date/Time   WBC 8.9 08/20/2022 2325   RBC 4.51 08/20/2022 2325   HGB 12.9 08/20/2022 2325   HGB 12.6 03/06/2020 0904   HCT 39.2 08/20/2022 2325   HCT 37.4 03/06/2020 0904   PLT 387 08/20/2022 2325   PLT 358 03/06/2020 0904   MCV 86.9 08/20/2022 2325   MCV 87 03/06/2020 0904   MCH 28.6 08/20/2022 2325   MCHC 32.9 08/20/2022 2325   RDW 12.7 08/20/2022 2325   RDW 12.7 03/06/2020 0904   Iron Studies    Component Value Date/Time   IRON 62 01/05/2019 0933    FERRITIN 57.7 01/05/2019 0933   Lipid Panel     Component Value Date/Time   CHOL 247 (H) 09/28/2022 1134   CHOL 246 (H) 01/05/2019 0933   TRIG 313 (H) 09/28/2022 1134   TRIG 296 (H) 01/05/2019 0933   HDL 36 (L) 09/28/2022 1134   CHOLHDL 6.9 (H) 09/28/2022 1134   CHOLHDL 6 12/27/2020 1004   VLDL 50.0 (H) 12/27/2020 1004   LDLCALC 153 (H) 09/28/2022 1134   LDLCALC 193 (H) 11/25/2018 1600   LDLDIRECT 170.0 12/27/2020 1004   Hepatic Function Panel     Component Value Date/Time   PROT 6.7 01/28/2023 0855   ALBUMIN 4.2 01/28/2023 0855   AST 36 01/28/2023 0855   ALT 57 (H) 01/28/2023 0855   ALKPHOS 77 01/28/2023 0855   BILITOT 0.8 01/28/2023 0855   BILIDIR <0.1 08/21/2022 0208   IBILI NOT CALCULATED 08/21/2022 0208      Component Value Date/Time   TSH 1.420 04/28/2022 0958   TSH 1.41 12/27/2020 1004   Nutritional Lab Results  Component Value Date   VD25OH 42.1 01/28/2023   VD25OH 35.7 09/09/2022   VD25OH 34.2 04/28/2022    Attestations:   I, Vernell Forest, acting as a Stage manager for Darlene Jenkins, DO., have compiled all relevant documentation for today's office visit on behalf of Darlene Jenkins, DO, while in the presence of [employer], DO.  Reviewed by clinician on day of visit: allergies, medications, problem list, medical history, surgical history, family history, social history, and previous encounter notes pertinent to patient's obesity diagnosis.  I have spent 40 minutes in the care of the patient today including 30 minutes face-to-face assessing and reviewing listed medical problems above as outlined in office visit note and providing nutritional and behavioral counseling as outlined in obesity care plan.   I have reviewed the above documentation for accuracy and completeness, and I agree with the above. Darlene Darlene Mccormick Mccormick, D.O.  The 21st Century Cures Act was signed into law in 2016 which includes the topic of electronic health records.  This provides  immediate access to information in MyChart.  This includes consultation notes, operative notes, office notes, lab results and pathology reports.  If you have any questions about what you read please  let us  know at your next visit so we can discuss your concerns and take corrective action if need be.  We are right here with you.

## 2023-10-28 NOTE — Telephone Encounter (Signed)
 In formed pt that her labs will be done today and she will pick up her Paper work and labs result next week. Pt verbalized understanding. Pt had no questions at this time but was encouraged to call back if questions arise.

## 2023-10-29 LAB — LIPID PANEL WITH LDL/HDL RATIO
Cholesterol, Total: 237 mg/dL — ABNORMAL HIGH (ref 100–199)
HDL: 33 mg/dL — ABNORMAL LOW (ref >39)
LDL Chol Calc (NIH): 165 mg/dL — ABNORMAL HIGH (ref 0–99)
LDL/HDL Ratio: 5 ratio — ABNORMAL HIGH (ref 0.0–3.2)
Triglycerides: 208 mg/dL — ABNORMAL HIGH (ref 0–149)
VLDL Cholesterol Cal: 39 mg/dL (ref 5–40)

## 2023-10-29 LAB — COMPREHENSIVE METABOLIC PANEL WITH GFR
ALT: 48 IU/L — ABNORMAL HIGH (ref 0–32)
AST: 35 IU/L (ref 0–40)
Albumin: 4.4 g/dL (ref 3.9–4.9)
Alkaline Phosphatase: 72 IU/L (ref 44–121)
BUN/Creatinine Ratio: 10 (ref 9–23)
BUN: 6 mg/dL (ref 6–24)
Bilirubin Total: 1.1 mg/dL (ref 0.0–1.2)
CO2: 18 mmol/L — ABNORMAL LOW (ref 20–29)
Calcium: 8.8 mg/dL (ref 8.7–10.2)
Chloride: 101 mmol/L (ref 96–106)
Creatinine, Ser: 0.63 mg/dL (ref 0.57–1.00)
Globulin, Total: 2.1 g/dL (ref 1.5–4.5)
Glucose: 131 mg/dL — ABNORMAL HIGH (ref 70–99)
Potassium: 4.7 mmol/L (ref 3.5–5.2)
Sodium: 139 mmol/L (ref 134–144)
Total Protein: 6.5 g/dL (ref 6.0–8.5)
eGFR: 111 mL/min/1.73 (ref >59)

## 2023-10-29 LAB — HEMOGLOBIN A1C
Est. average glucose Bld gHb Est-mCnc: 157 mg/dL
Hgb A1c MFr Bld: 7.1 % — ABNORMAL HIGH (ref 4.8–5.6)

## 2023-10-29 LAB — INSULIN, RANDOM: INSULIN: 41.1 u[IU]/mL — ABNORMAL HIGH (ref 2.6–24.9)

## 2023-10-29 LAB — VITAMIN D 25 HYDROXY (VIT D DEFICIENCY, FRACTURES): Vit D, 25-Hydroxy: 33.8 ng/mL (ref 30.0–100.0)

## 2023-11-01 ENCOUNTER — Encounter (INDEPENDENT_AMBULATORY_CARE_PROVIDER_SITE_OTHER): Payer: Self-pay

## 2023-11-05 ENCOUNTER — Ambulatory Visit: Admitting: Family Medicine

## 2023-11-05 ENCOUNTER — Other Ambulatory Visit (HOSPITAL_COMMUNITY): Payer: Self-pay

## 2023-11-05 ENCOUNTER — Encounter: Payer: Self-pay | Admitting: Family Medicine

## 2023-11-05 ENCOUNTER — Telehealth: Payer: Self-pay

## 2023-11-05 VITALS — BP 132/88 | HR 91 | Temp 98.2°F | Ht 68.0 in | Wt 297.4 lb

## 2023-11-05 DIAGNOSIS — E119 Type 2 diabetes mellitus without complications: Secondary | ICD-10-CM | POA: Diagnosis not present

## 2023-11-05 DIAGNOSIS — G4733 Obstructive sleep apnea (adult) (pediatric): Secondary | ICD-10-CM | POA: Diagnosis not present

## 2023-11-05 DIAGNOSIS — Z7985 Long-term (current) use of injectable non-insulin antidiabetic drugs: Secondary | ICD-10-CM

## 2023-11-05 LAB — MICROALBUMIN / CREATININE URINE RATIO
Creatinine,U: 97.4 mg/dL
Microalb Creat Ratio: UNDETERMINED mg/g (ref 0.0–30.0)
Microalb, Ur: <0.7 mg/dL

## 2023-11-05 MED ORDER — BLOOD GLUCOSE MONITORING SUPPL DEVI: 1.0000 | Freq: Every day | 0 refills | Status: AC

## 2023-11-05 MED ORDER — LANCET DEVICE MISC: 1.0000 | Freq: Every day | 0 refills | Status: AC

## 2023-11-05 MED ORDER — LANCETS MISC. MISC: 1.0000 | Freq: Every day | 0 refills | Status: AC

## 2023-11-05 MED ORDER — TIRZEPATIDE 2.5 MG/0.5ML ~~LOC~~ SOAJ: 2.5000 mg | SUBCUTANEOUS | 0 refills | Status: DC

## 2023-11-05 MED ORDER — BLOOD GLUCOSE TEST VI STRP: 1.0000 | ORAL_STRIP | Freq: Every day | 1 refills | Status: AC

## 2023-11-05 NOTE — Progress Notes (Signed)
 Established Patient Office Visit  Subjective   Patient ID: Darlene Mccormick, female    DOB: 11-20-1975  Age: 48 y.o. MRN: [id number]  Chief Complaint  Patient presents with   Results    Patient presents to discuss lab test results from Dr Raeford and wellness    Pt is here to discuss her labwork. [facility] and had repeat A1C which showed a full diagnosis of diabetes. A1C was 7.1, previously It was 6.4. patient reports she has been on metformin  in the past and had upset stomach, nausea and diarrhea as well as brain fog with the metformin . We discussed GLP medication including side effects and she is agreeable to start the medication.     Current Outpatient Medications  Medication Instructions   Blood Glucose Monitoring Suppl DEVI 1 each, Does not apply, Daily, May substitute to any manufacturer covered by patient's insurance.   CALCIUM CARBONATE-VITAMIN D  PO 5,000 Int'l Units/L, Daily   diphenhydrAMINE  HCl (BENADRYL  PO) Take by mouth.   estradiol (CLIMARA - DOSED IN MG/24 HR) 0.025 mg, Weekly   fluticasone  (FLONASE  ALLERGY RELIEF) 50 MCG/ACT nasal spray As needed   Glucose Blood (BLOOD GLUCOSE TEST STRIPS) STRP 1 each, In Vitro, Daily, May substitute to any manufacturer covered by patient's insurance.   Lancet Device MISC 1 each, Does not apply, Daily, May substitute to any manufacturer covered by patient's insurance.   Lancets Misc. MISC 1 each, Does not apply, Daily, May substitute to any manufacturer covered by patient's insurance.   Magnesium 500 mg, Daily   Multiple Vitamin (MULTIVITAMIN WITH MINERALS) TABS tablet 1 tablet, Daily   norethindrone (MICRONOR) 0.35 MG tablet 1 tablet, Daily   tirzepatide  (MOUNJARO ) 2.5 mg, Subcutaneous, Weekly    Patient Active Problem List   Diagnosis Date Noted   Diabetes mellitus without complication (HCC) 11/10/2023   Obstructive sleep apnea 11/05/2023   Encounter for medication review and  counseling 10/28/2023   Insulin  resistance 09/28/2022   Depression 02/23/2022   Mixed hyperlipidemia 12/30/2021   Hypocalcemia 05/15/2021   Metabolic dysfunction-associated steatotic liver disease (MASLD) 09/25/2019   Prediabetes 06/08/2019   Class 3 severe obesity with serious comorbidity and body mass index (BMI) of 40.0 to 44.9 in adult 05/17/2019   Fibroids 02/16/2018   S/P myomectomy 02/16/2018   Vitamin D  deficiency 06/10/2016   GAD (generalized anxiety disorder) 06/10/2016   Hyperlipidemia, mixed 06/03/2016   Morbid obesity (HCC) 06/03/2016      Review of Systems  All other systems reviewed and are negative.     Objective:     BP 132/88   Pulse 91   Temp 98.2 F (36.8 C) (Oral)   Ht 5' 8 (1.727 m)   Wt 297 lb 6.4 oz (134.9 kg)   LMP 09/05/2023 (Approximate)   SpO2 95%   BMI 45.22 kg/m    Physical Exam Vitals reviewed.  Constitutional:      Appearance: Normal appearance. She is morbidly obese.  Cardiovascular:     Rate and Rhythm: Normal rate and regular rhythm.     Heart sounds: Normal heart sounds. No murmur heard. Pulmonary:     Effort: Pulmonary effort is normal.     Breath sounds: Normal breath sounds. No wheezing.  Neurological:     Mental Status: She is alert and oriented to person, place, and time. Mental status is at baseline.  Psychiatric:        Mood and Affect: Mood normal.  Behavior: Behavior normal.      The 10-year ASCVD risk score (Arnett DK, et al., 2019) is: 5.1%    Assessment & Plan:  Diabetes mellitus without complication (HCC) Assessment & Plan: Pt had adverse reactions to metformin  when she was on it in the past, patient is an excellent candidate for GLP therapy with her other comorbidities being morbid obesity and OSA. Will order urine testing, obtain a glucose monitor for her at home, start on tirzepatide .   Orders: -     Tirzepatide ; Inject 2.5 mg into the skin once a week.  Dispense: 2 mL; Refill: 0 -      Microalbumin / creatinine urine ratio; Future -     Blood Glucose Monitoring Suppl; 1 each by Does not apply route daily. May substitute to any manufacturer covered by patient's insurance.  Dispense: 1 each; Refill: 0 -     Blood Glucose Test; 1 each by In Vitro route daily. May substitute to any manufacturer covered by patient's insurance.  Dispense: 100 strip; Refill: 1 -     Lancet Device; 1 each by Does not apply route daily. May substitute to any manufacturer covered by patient's insurance.  Dispense: 1 each; Refill: 0 -     Lancets Misc.; 1 each by Does not apply route daily. May substitute to any manufacturer covered by patient's insurance.  Dispense: 100 each; Refill: 0  Obstructive sleep apnea Assessment & Plan: Now on CPAP, has follow up study for CPAP titration in September      Return in about 1 month (around 12/06/2023) for DM.    Heron CHRISTELLA Sharper, MD

## 2023-11-05 NOTE — Telephone Encounter (Signed)
 Pharmacy Patient Advocate Encounter   Received notification from CoverMyMeds that prior authorization for Mounjaro  2.5MG /0.5ML auto-injectors is required/requested.   Insurance verification completed.   The patient is insured through [facility] .   Per test claim: PA required; PA submitted to above mentioned insurance via CoverMyMeds Key/confirmation #/EOC [id number] Status is pending

## 2023-11-05 NOTE — Telephone Encounter (Signed)
 Pharmacy Patient Advocate Encounter  Received notification from [employer] that Prior Authorization for Mounjaro  2.5MG /0.5ML auto-injectors  has been APPROVED from 11/05/23 to 11/04/24   PA #/Case ID/Reference #: [id number]

## 2023-11-05 NOTE — Assessment & Plan Note (Signed)
 Now on CPAP, has follow up study for CPAP titration in September

## 2023-11-05 NOTE — Patient Instructions (Addendum)
 Maximize protein: at least 100 grams per day  Start a multivitamin -- something with vitamin D  and B12 in it.  Muscle strengthening exercises-- free weights, resistance bands, etc.   Schedule eye exam for diabetic eye exam to look at the retina  Goal fasting blood sugar less than 100 NO sugars over 200 -- especially after meals

## 2023-11-05 NOTE — Telephone Encounter (Signed)
That's great! Please let patient know!

## 2023-11-07 ENCOUNTER — Ambulatory Visit: Payer: Self-pay | Admitting: Family Medicine

## 2023-11-10 DIAGNOSIS — E119 Type 2 diabetes mellitus without complications: Secondary | ICD-10-CM | POA: Insufficient documentation

## 2023-11-10 NOTE — Assessment & Plan Note (Signed)
 Pt had adverse reactions to metformin  when she was on it in the past, patient is an excellent candidate for GLP therapy with her other comorbidities being morbid obesity and OSA. Will order urine testing, obtain a glucose monitor for her at home, start on tirzepatide .

## 2023-11-10 NOTE — Telephone Encounter (Signed)
 Patient came today to pick up her Work related Insurance forms.

## 2023-11-23 ENCOUNTER — Encounter: Payer: Self-pay | Admitting: Family Medicine

## 2023-11-23 DIAGNOSIS — E119 Type 2 diabetes mellitus without complications: Secondary | ICD-10-CM

## 2023-11-25 ENCOUNTER — Other Ambulatory Visit: Payer: Self-pay | Admitting: Family Medicine

## 2023-11-25 ENCOUNTER — Ambulatory Visit (INDEPENDENT_AMBULATORY_CARE_PROVIDER_SITE_OTHER): Admitting: Family Medicine

## 2023-11-25 DIAGNOSIS — E119 Type 2 diabetes mellitus without complications: Secondary | ICD-10-CM

## 2023-11-27 ENCOUNTER — Other Ambulatory Visit: Payer: Self-pay | Admitting: Family Medicine

## 2023-11-27 DIAGNOSIS — E119 Type 2 diabetes mellitus without complications: Secondary | ICD-10-CM

## 2023-11-30 MED ORDER — TIRZEPATIDE 2.5 MG/0.5ML ~~LOC~~ SOAJ: 2.5000 mg | SUBCUTANEOUS | 0 refills | Status: DC

## 2023-12-02 ENCOUNTER — Ambulatory Visit (HOSPITAL_BASED_OUTPATIENT_CLINIC_OR_DEPARTMENT_OTHER): Attending: Cardiology | Admitting: Cardiology

## 2023-12-02 DIAGNOSIS — G4733 Obstructive sleep apnea (adult) (pediatric): Secondary | ICD-10-CM | POA: Diagnosis present

## 2023-12-05 NOTE — Addendum Note (Signed)
 Addended by: SHLOMO WILBERT SAUNDERS on: 12/05/2023 06:03 PM   Modules accepted: Orders

## 2023-12-05 NOTE — Procedures (Addendum)
  Indications for Polysomnography The patient is a 48 year-old Female who is 5' 8 and weighs 297.0 lbs. Her BMI equals 45.5.  A full night titration treatment study was performed.  MedicationRelaxing Sleep Magnesium Polysomnogram Data A full night polysomnogram recorded the standard physiologic parameters including EEG, EOG, EMG, EKG, nasal and oral airflow.  Respiratory parameters of chest and abdominal movements were recorded with Respiratory Inductance Plethysmography belts.   Oxygen saturation was recorded by pulse oximetry.  Sleep Architecture The total recording time of the polysomnogram was 389.8 minutes.  The total sleep time was 362.0 minutes.  The patient spent 2.9% of total sleep time in Stage N1, 51.8% in Stage N2, 22.5% in Stages N3, and 22.8% in REM.  Sleep latency was 2.8 minutes.   REM latency was 53.0 minutes.  Sleep Efficiency was 92.9%.  Wake after Sleep Onset time was 25.0 minutes.  Titration Summary The patient was titrated at pressures ranging from 5 cm/H20 up to 16 cm/H20.  The last pressure used in the study was 16 cm/H20.  Respiratory Events The polysomnogram revealed a presence of 0 obstructive, 1 central, and - mixed apneas resulting in an Apnea index of 0.2 events per hour.  There were 41 hypopneas (GreaterEqual to3% desaturation and/or arousal) resulting in an Apnea\Hypopnea Index (AHI  GreaterEqual to3% desaturation and/or arousal) of 7.0 events per hour.  There were 17 hypopneas (GreaterEqual to4% desaturation) resulting in an Apnea\Hypopnea Index (AHI GreaterEqual to4% desaturation) of 3.0 events per hour.  There were 1 Respiratory  Effort Related Arousals resulting in a RERA index of 0.2 events per hour. The Respiratory Disturbance Index is 7.1 events per hour.  The snore index was 28.0 events per hour.  Mean oxygen saturation was 94.3%.  The lowest oxygen saturation during sleep was 86.0%.  Time spent LessEqual to88% oxygen saturation was  minutes ().  Limb  Activity There were 2 limb movements recorded.  Of this total, 0 were classified as PLMs.  Of the PLMs, 0 were associated with arousals.  The Limb Movement index was 0.3 per hour while the PLM index was 0 per hour.  Cardiac Summary The average pulse rate was 75.0 bpm.  The minimum pulse rate was 62.0 bpm while the maximum pulse rate was 97.0 bpm.  Cardiac rhythm was normal.  Diagnosis: Obstructive Sleep Apnea  Recommendations: 1. Recommend a trial of ResMed at 13cm H2O with heated humidity and small ResMed P10 nasal pillow mask. 2. Close follow-up is necessary to ensure success with CPAP or oral appliance therapy for maximum benefit. 3.  A follow-up oximetry study on CPAP is recommended to assess the adequacy of therapy and determine the need for supplemental oxygen or the potential need for Bi-level therapy.  An arterial blood gas to determine the adequacy of baseline ventilation  and oxygenation should also be considered. 4. Healthy sleep recommendations include:  adequate nightly sleep (normal 7-9 hrs/night), avoidance of caffeine after noon and alcohol near bedtime, and maintaining a sleep environment that is cool, dark and quiet. 5. Weight loss for overweight patients is recommended.  Even modest amounts of weight loss can significantly improve the severity of sleep apnea. 6. Snoring recommendations include:  weight loss where appropriate, side sleeping, and avoidance of alcohol before bed. 7. Operation of motor vehicle should be avoided when sleepy.      This study was personally reviewed and electronically signed by: Wilbert Bihari, MD Accredited Board Certified in Sleep Medicine Date/Time:

## 2023-12-07 ENCOUNTER — Ambulatory Visit: Admitting: Family Medicine

## 2023-12-07 ENCOUNTER — Encounter: Payer: Self-pay | Admitting: Family Medicine

## 2023-12-07 VITALS — BP 122/80 | HR 90 | Temp 98.4°F | Ht 68.0 in | Wt 294.8 lb

## 2023-12-07 DIAGNOSIS — E119 Type 2 diabetes mellitus without complications: Secondary | ICD-10-CM | POA: Diagnosis not present

## 2023-12-07 DIAGNOSIS — Z7985 Long-term (current) use of injectable non-insulin antidiabetic drugs: Secondary | ICD-10-CM

## 2023-12-07 DIAGNOSIS — Z23 Encounter for immunization: Secondary | ICD-10-CM | POA: Diagnosis not present

## 2023-12-07 DIAGNOSIS — G4733 Obstructive sleep apnea (adult) (pediatric): Secondary | ICD-10-CM

## 2023-12-07 MED ORDER — MOUNJARO 5 MG/0.5ML ~~LOC~~ SOAJ: 5.0000 mg | SUBCUTANEOUS | 0 refills | Status: DC

## 2023-12-07 NOTE — Assessment & Plan Note (Signed)
 Doing well on the 2.5 mg dose, will continue to increase the dose monthly for patient. Script for 5 mg weekly dose sent. Foot exam normal, reminded patient of eye exam.

## 2023-12-07 NOTE — Patient Instructions (Signed)
-   Eye exam is due

## 2023-12-07 NOTE — Progress Notes (Signed)
 Established Patient Office Visit  Subjective   Patient ID: Darlene Mccormick, female    DOB: [birth year]  Age: 48 y.o. MRN: [id number]  Chief Complaint  Patient presents with   Medical Management of Chronic Issues    Pt has been on the mounjaro  for 1 month. She reports that she doing well on her medication, so far no side effects from the medication, is staying regular. Is lowering her carbohydrates and eating less as far as carbs. Is making her lunch at home rather than eating out and feels like she is doing well.  Pt reports that her sleep study was POSITIVE for OSA as well, even more reason to continue the tirzepatide .      Current Outpatient Medications  Medication Instructions   Blood Glucose Monitoring Suppl DEVI 1 each, Does not apply, Daily, May substitute to any manufacturer covered by patient's insurance.   CALCIUM CARBONATE-VITAMIN D  PO 5,000 Int'l Units/L, Daily   diphenhydrAMINE  HCl (BENADRYL  PO) Take by mouth.   estradiol (CLIMARA - DOSED IN MG/24 HR) 0.025 mg, Weekly   fluticasone  (FLONASE  ALLERGY RELIEF) 50 MCG/ACT nasal spray As needed   Glucose Blood (BLOOD GLUCOSE TEST STRIPS) STRP 1 each, In Vitro, Daily, May substitute to any manufacturer covered by patient's insurance.   Magnesium 500 mg, Daily   Mounjaro  5 mg, Subcutaneous, Weekly   Multiple Vitamin (MULTIVITAMIN WITH MINERALS) TABS tablet 1 tablet, Daily   norethindrone (MICRONOR) 0.35 MG tablet 1 tablet, Daily    Patient Active Problem List   Diagnosis Date Noted   Obstructive sleep apnea 11/05/2023   Encounter for medication review and counseling 10/28/2023   Insulin  resistance 09/28/2022   Depression 02/23/2022   Mixed hyperlipidemia 12/30/2021   Hypocalcemia 05/15/2021   Metabolic dysfunction-associated steatotic liver disease (MASLD) 09/25/2019   Diabetes mellitus without complication (HCC) 06/08/2019   Class 3 severe obesity with serious comorbidity and body mass index (BMI) of 40.0 to  44.9 in adult 05/17/2019   Fibroids 02/16/2018   S/P myomectomy 02/16/2018   Vitamin D  deficiency 06/10/2016   GAD (generalized anxiety disorder) 06/10/2016   Hyperlipidemia, mixed 06/03/2016   Morbid obesity (HCC) 06/03/2016     Review of Systems  All other systems reviewed and are negative.     Objective:     BP 122/80   Pulse 90   Temp 98.4 F (36.9 C) (Oral)   Ht 5' 8 (1.727 m)   Wt 294 lb 12.8 oz (133.7 kg)   SpO2 100%   BMI 44.82 kg/m    Physical Exam Vitals reviewed.  Constitutional:      Appearance: Normal appearance. She is morbidly obese.  Pulmonary:     Effort: Pulmonary effort is normal.  Musculoskeletal:     Right lower leg: No edema.     Left lower leg: No edema.  Neurological:     Mental Status: She is alert and oriented to person, place, and time.  Psychiatric:        Mood and Affect: Mood normal.    Diabetic Foot Exam - Simple   Simple Foot Form Diabetic Foot exam was performed with the following findings: Yes 12/07/2023  9:26 AM  Visual Inspection No deformities, no ulcerations, no other skin breakdown bilaterally: Yes Sensation Testing Intact to touch and monofilament testing bilaterally: Yes Pulse Check Posterior Tibialis and Dorsalis pulse intact bilaterally: Yes Comments      No results found for any visits on 12/07/23.    The 10-year ASCVD risk score (  Arnett DK, et al., 2019) is: 4.4%    Assessment & Plan:  Diabetes mellitus without complication [facility]) Assessment & Plan: Doing well on the 2.5 mg dose, will continue to increase the dose monthly for patient. Script for 5 mg weekly dose sent. Foot exam normal, reminded patient of eye exam.   Orders: -     Mounjaro ; Inject 5 mg into the skin once a week.  Dispense: 2 mL; Refill: 0  Obstructive sleep apnea Assessment & Plan: Reviewed the sleep study results, her level was severe, is waiting to get her CPAP machine delivered. It is definitely medically necessary to continue  tirzepatide  as it is also FDA approved for OSA.    Immunization due -     Pneumococcal conjugate vaccine 20-valent       Return in about 6 weeks (around 01/18/2024) for DM.    Heron CHRISTELLA Sharper, MD

## 2023-12-07 NOTE — Assessment & Plan Note (Signed)
 Reviewed the sleep study results, her level was severe, is waiting to get her CPAP machine delivered. It is definitely medically necessary to continue tirzepatide  as it is also FDA approved for OSA.

## 2023-12-13 ENCOUNTER — Telehealth: Payer: Self-pay | Admitting: *Deleted

## 2023-12-13 NOTE — Telephone Encounter (Signed)
-----   Message from Wilbert Bihari sent at 12/05/2023  6:01 PM EDT ----- Please let patient know that they had a successful PAP titration and let DME know that orders are in EPIC.  Please set up 6 week OV with me.

## 2023-12-14 ENCOUNTER — Ambulatory Visit (INDEPENDENT_AMBULATORY_CARE_PROVIDER_SITE_OTHER): Admitting: Family Medicine

## 2023-12-14 NOTE — Telephone Encounter (Signed)
 Pt requesting a c/b to go over results and what's next.

## 2023-12-15 ENCOUNTER — Ambulatory Visit (INDEPENDENT_AMBULATORY_CARE_PROVIDER_SITE_OTHER): Admitting: Family Medicine

## 2023-12-15 NOTE — Telephone Encounter (Signed)
 The patient has been notified of the result and verbalized understanding.  All questions (if any) were answered. Darlene Mccormick, CMA 12/15/2023 3:24 PM     Upon patient request DME selection is [facility]. Patient understands he will be contacted by [facility] to set up his cpap. Patient understands to call if [facility] does not contact him with new setup in a timely manner. Patient understands they will be called once confirmation has been received from [facility] that they have received their new machine to schedule 10 week follow up appointment.   [facility] notified of new cpap order  Please add to airview Patient was grateful for the call and thanked me.

## 2023-12-29 LAB — OPHTHALMOLOGY REPORT-SCANNED

## 2024-01-06 ENCOUNTER — Encounter: Payer: Self-pay | Admitting: Family Medicine

## 2024-01-06 DIAGNOSIS — E119 Type 2 diabetes mellitus without complications: Secondary | ICD-10-CM

## 2024-01-06 MED ORDER — TIRZEPATIDE 7.5 MG/0.5ML ~~LOC~~ SOAJ: 7.5000 mg | SUBCUTANEOUS | 2 refills | Status: DC

## 2024-01-18 ENCOUNTER — Ambulatory Visit: Admitting: Family Medicine

## 2024-01-18 ENCOUNTER — Encounter: Payer: Self-pay | Admitting: Family Medicine

## 2024-01-18 NOTE — Progress Notes (Signed)
 Established Patient Office Visit  Subjective   Patient ID: Darlene Mccormick, female    DOB: 11-19-1975  Age: 48 y.o. MRN: [id number]  Chief Complaint  Patient presents with   Medical Management of Chronic Issues    HPI  Pt is here for follow up on weight loss/ diabetes. She reports she is doing very well on the Mounjaro , states that she is using her CPAP at home as prescribed and is feeling a lot better with treatment. Feels more rested and reports no side effects from the mounjaro . Will be starting the 7.5 mg dose this week.   Current Outpatient Medications  Medication Instructions   Blood Glucose Monitoring Suppl DEVI 1 each, Does not apply, Daily, May substitute to any manufacturer covered by patient's insurance.   CALCIUM CARBONATE-VITAMIN D  PO 5,000 Int'l Units/L, Daily   CREATINE PO 3 times weekly   diphenhydrAMINE  HCl (BENADRYL  PO) Take by mouth.   estradiol (CLIMARA - DOSED IN MG/24 HR) 0.025 mg, Weekly   fluticasone  (FLONASE  ALLERGY RELIEF) 50 MCG/ACT nasal spray As needed   Glucose Blood (BLOOD GLUCOSE TEST STRIPS) STRP 1 each, In Vitro, Daily, May substitute to any manufacturer covered by patient's insurance.   Magnesium 500 mg, Daily   Multiple Vitamin (MULTIVITAMIN WITH MINERALS) TABS tablet 1 tablet, Daily   norethindrone (MICRONOR) 0.35 MG tablet 1 tablet, Daily   tirzepatide  (MOUNJARO ) 7.5 mg, Subcutaneous, Weekly   VITAMIN D  PO 5,000 Units, Daily    Patient Active Problem List   Diagnosis Date Noted   Obstructive sleep apnea 11/05/2023   Encounter for medication review and counseling 10/28/2023   Insulin  resistance 09/28/2022   Depression 02/23/2022   Mixed hyperlipidemia 12/30/2021   Hypocalcemia 05/15/2021   Metabolic dysfunction-associated steatotic liver disease (MASLD) 09/25/2019   Diabetes mellitus without complication (HCC) 06/08/2019   Class 3 severe obesity with serious comorbidity and body mass index (BMI) of 40.0 to 44.9 in adult [facility])  05/17/2019   Fibroids 02/16/2018   S/P myomectomy 02/16/2018   Vitamin D  deficiency 06/10/2016   GAD (generalized anxiety disorder) 06/10/2016   Hyperlipidemia, mixed 06/03/2016   Morbid obesity (HCC) 06/03/2016     Review of Systems  All other systems reviewed and are negative.     Objective:     BP 118/80   Pulse 98   Temp 98.3 F (36.8 C) (Oral)   Ht 5' 8 (1.727 m)   Wt 287 lb 12.8 oz (130.5 kg)   SpO2 98%   BMI 43.76 kg/m    Physical Exam Vitals reviewed.  Constitutional:      Appearance: Normal appearance. She is morbidly obese.  Pulmonary:     Effort: Pulmonary effort is normal.  Musculoskeletal:     Right lower leg: No edema.     Left lower leg: No edema.  Neurological:     Mental Status: She is alert and oriented to person, place, and time.  Psychiatric:        Mood and Affect: Mood normal.      No results found for any visits on 01/18/24.    The 10-year ASCVD risk score (Arnett DK, et al., 2019) is: 4.1%    Assessment & Plan:  Morbid obesity (HCC)   Patient is doing well, has lost 10 pounds in the last 2 months, will continue to see her monthly for reinforcement of dietary goals and exercise goals. I have already called in the 7.5 mg weekly dose of Mounjaro . I spent 20 minutes with  the patient today providing counseling and continued education on exercise and dietary goals.   Return in about 1 month (around 02/18/2024) for weight loss, A1C check.    Heron CHRISTELLA Sharper, MD

## 2024-01-20 ENCOUNTER — Ambulatory Visit (INDEPENDENT_AMBULATORY_CARE_PROVIDER_SITE_OTHER): Admitting: Family Medicine

## 2024-02-02 ENCOUNTER — Encounter: Payer: Self-pay | Admitting: Family Medicine

## 2024-02-02 ENCOUNTER — Telehealth: Payer: Self-pay

## 2024-02-02 DIAGNOSIS — E119 Type 2 diabetes mellitus without complications: Secondary | ICD-10-CM

## 2024-02-02 NOTE — Telephone Encounter (Signed)
 SABRA

## 2024-02-03 MED ORDER — TIRZEPATIDE 10 MG/0.5ML ~~LOC~~ SOAJ: 10.0000 mg | SUBCUTANEOUS | 1 refills | Status: DC

## 2024-02-08 ENCOUNTER — Encounter: Payer: Self-pay | Admitting: Cardiology

## 2024-02-08 ENCOUNTER — Ambulatory Visit: Attending: Cardiology | Admitting: Cardiology

## 2024-02-08 VITALS — BP 130/80 | HR 82 | Ht 68.5 in | Wt 292.2 lb

## 2024-02-08 DIAGNOSIS — Z79899 Other long term (current) drug therapy: Secondary | ICD-10-CM | POA: Diagnosis not present

## 2024-02-08 DIAGNOSIS — G4733 Obstructive sleep apnea (adult) (pediatric): Secondary | ICD-10-CM | POA: Diagnosis not present

## 2024-02-08 DIAGNOSIS — R079 Chest pain, unspecified: Secondary | ICD-10-CM

## 2024-02-08 DIAGNOSIS — E782 Mixed hyperlipidemia: Secondary | ICD-10-CM | POA: Diagnosis not present

## 2024-02-08 NOTE — Addendum Note (Signed)
 Addended by: JANIT GENI CROME on: 02/08/2024 09:09 AM   Modules accepted: Orders

## 2024-02-08 NOTE — Patient Instructions (Signed)
 Medication Instructions:  Your physician recommends that you continue on your current medications as directed. Please refer to the Current Medication list given to you today.  *If you need a refill on your cardiac medications before your next appointment, please call your pharmacy*  Lab Work: Please complete a Vit D, HGB A1c, Random Insulin , CMET today in our lab before your leave.  Please go to any [facility] to complete a FASTING NMR when you have been fasting for 8-12 hours.   If you have labs (blood work) drawn today and your tests are completely normal, you will receive your results only by: MyChart Message (if you have MyChart) OR A paper copy in the mail If you have any lab test that is abnormal or we need to change your treatment, we will call you to review the results.  Testing/Procedures: None.   Follow-Up: At [facility], you and your health needs are our priority.  As part of our continuing mission to provide you with exceptional heart care, our providers are all part of one team.  This team includes your primary Cardiologist (physician) and Advanced Practice Providers or APPs (Physician Assistants and Nurse Practitioners) who all work together to provide you with the care you need, when you need it.  Your next appointment:   1 year(s)  Provider:   Dr. Wilbert Bihari, MD

## 2024-02-08 NOTE — Progress Notes (Signed)
 Cardiology  Note    Date:  02/08/2024   ID:  Darlene Mccormick, DOB Dec 08, 1975, MRN [id number]  PCP:  Ozell Heron HERO, MD  Cardiologist:  Wilbert Bihari, MD   Chief Complaint  Patient presents with   Sleep Apnea   Hyperlipidemia   History of Present Illness:  Darlene Mccormick is a 48 y.o. female with a history of anxiety, depression, hyperlipidemia, prediabetes initially chest pain.  Coronary calcium score was done which was 0. She came off estrogen she has not had any further CP or pressure.    At last office visit she complained of excessive daytime sleepiness and underwent a home sleep study which showed severe obstructive sleep apnea with an AHI of 30.9/h but no central events.  O2 saturation nadir was 70%.  She spent 40.3 minutes with O2 sats less than 88%.  This was consistent with nocturnal hypoxemia.  She underwent CPAP titration at the sleep lab and was titrated to CPAP at 13 cm H2O.  She is now back for follow-up.  She is doing well with her PAP device and thinks that she has gotten used to it.  She tolerates the nasal pillow mask and feels the pressure is adequate.  Since going on PAP she feels rested in the am and has no significant daytime sleepiness.  She denies any significant mouth or nasal dryness or nasal congestion.  She does not think that she snores. An Epworth Sleepiness Scale score was calculated the office today and this endorsed at 4 arguing against residual daytime sleepiness. Patient denies any episodes of restless legs, No gagging hallucinations or cataplectic events.  She does have some bruxism but uses a bit block.  Past Medical History:  Diagnosis Date   Anxiety    B12 deficiency    Depression    Elevated liver enzymes    Fatty liver    Gallstones    High triglycerides    Hyperlipidemia    Hypertension    not recently   Joint pain    Obesity    Pre-diabetes    Uterine fibroid    Vitamin D  deficiency     Past Surgical History:   Procedure Laterality Date   APPENDECTOMY  1997   MYOMECTOMY N/A 02/16/2018   Procedure: ABDOMINAL MYOMECTOMY;  Surgeon: Timmie Norris, MD;  Location: [facility];  Service: Gynecology;  Laterality: N/A;   TONSILLECTOMY AND ADENOIDECTOMY  1988   TRACHEOSTOMY  1979   done during epiglotitis   UTERINE FIBROID SURGERY      Current Medications: Current Meds  Medication Sig   Blood Glucose Monitoring Suppl DEVI 1 each by Does not apply route daily. May substitute to any manufacturer covered by patient's insurance.   CREATINE PO Take by mouth 3 (three) times a week.   diphenhydrAMINE  HCl (BENADRYL  PO) Take by mouth.   estradiol (CLIMARA - DOSED IN MG/24 HR) 0.025 mg/24hr patch Place 0.025 mg onto the skin once a week.   fluticasone  (FLONASE  ALLERGY RELIEF) 50 MCG/ACT nasal spray Place into both nostrils as needed for allergies or rhinitis.   Glucose Blood (BLOOD GLUCOSE TEST STRIPS) STRP 1 each by In Vitro route daily. May substitute to any manufacturer covered by patient's insurance.   Magnesium 500 MG CAPS Take 500 mg by mouth daily.   Multiple Vitamin (MULTIVITAMIN WITH MINERALS) TABS tablet Take 1 tablet by mouth daily.   norethindrone (MICRONOR) 0.35 MG tablet Take 1 tablet by mouth daily.   tirzepatide  (MOUNJARO )  10 MG/0.5ML Pen Inject 10 mg into the skin once a week.   VITAMIN D  PO Take 5,000 Units by mouth daily.    Allergies:   Patient has no known allergies.   Social History   Socioeconomic History   Marital status: Married    Spouse name: John   Number of children: Not on file   Years of education: Not on file   Highest education level: Not on file  Occupational History   Occupation: Librarian, Academic of [employer]   Occupation: Warehouse Manager  Tobacco Use   Smoking status: Never   Smokeless tobacco: Never  Vaping Use   Vaping status: Never Used  Substance and Sexual Activity   Alcohol use: Yes    Alcohol/week: 1.0 standard drink of  alcohol    Types: 1 Glasses of wine per week    Comment: occ   Drug use: No   Sexual activity: Yes    Partners: Male  Other Topics Concern   Not on file  Social History Narrative   Not on file   Social Drivers of Health   Financial Resource Strain: Not on file  Food Insecurity: Not on file  Transportation Needs: Not on file  Physical Activity: Not on file  Stress: Not on file  Social Connections: Not on file     Family History:  The patient's family history includes Alcohol abuse in her maternal grandfather; Anxiety disorder in her father; Breast cancer in her maternal grandmother and another family member; Diabetes in her mother; Heart attack (age of onset: 88) in her paternal grandmother; High Cholesterol in her mother; High blood pressure in her brother; Hyperlipidemia in her father; Hypertension in her father; Obesity in her father and mother; Other in her mother; Sleep apnea in her father.   ROS:   Please see the history of present illness.    ROS All other systems reviewed and are negative.      No data to display             PHYSICAL EXAM:   VS:  BP 130/80   Pulse 82   Ht 5' 8.5 (1.74 m)   Wt 292 lb 3.2 oz (132.5 kg)   SpO2 99%   BMI 43.78 kg/m    GEN: Well nourished, well developed in no acute distress HEENT: Normal NECK: No JVD; No carotid bruits LYMPHATICS: No lymphadenopathy CARDIAC:RRR, no murmurs, rubs, gallops RESPIRATORY:  Clear to auscultation without rales, wheezing or rhonchi  ABDOMEN: Soft, non-tender, non-distended MUSCULOSKELETAL:  No edema; No deformity  SKIN: Warm and dry NEUROLOGIC:  Alert and oriented x 3 PSYCHIATRIC:  Normal affect Wt Readings from Last 3 Encounters:  02/08/24 292 lb 3.2 oz (132.5 kg)  01/18/24 287 lb 12.8 oz (130.5 kg)  12/07/23 294 lb 12.8 oz (133.7 kg)      Studies/Labs Reviewed:      Recent Labs: 10/28/2023: ALT 48; BUN 6; Creatinine, Ser 0.63; Potassium 4.7; Sodium 139   Lipid Panel    Component  Value Date/Time   CHOL 237 (H) 10/28/2023 0948   CHOL 246 (H) 01/05/2019 0933   TRIG 208 (H) 10/28/2023 0948   TRIG 296 (H) 01/05/2019 0933   HDL 33 (L) 10/28/2023 0948   CHOLHDL 6.9 (H) 09/28/2022 1134   CHOLHDL 6 12/27/2020 1004   VLDL 50.0 (H) 12/27/2020 1004   LDLCALC 165 (H) 10/28/2023 0948   LDLCALC 193 (H) 11/25/2018 1600   LDLDIRECT 170.0 12/27/2020 1004  ASSESSMENT:    1. Chest pain of uncertain etiology   2. Hyperlipidemia, mixed   3. OSA (obstructive sleep apnea)       PLAN:  In order of problems listed above:  Chest pain  -likely related to elevated BP in the setting of newly started estrogen for perimenopausal sx -she stopped the estrogen and all of her symptoms have resolved.  - Cor calcium score was 0  HLD - LDL goal less than 100 -I have personally reviewed and interpreted outside labs performed by patient's PCP which showed LDL 165, HDL 33, triglycerides 208 on 10/28/2023 -NMR panel was done 11/04/2022 showing a high LDL particle # 2870 with small dense LDL size of 20.2, total LDL 180, small LDL particle #1929, LP(a) 82.3, triglycerides 228 10/2022 -PCP did not start any lipid lowering agents a year ago -recommend starting statin but she would like it rechecked since it has been a year>>check FLP with NMR -will also check HbA1C/Insulin  level and CMET and 25 Hydroxy Vit D level  OSA - The patient is tolerating PAP therapy well without any problems. The PAP download performed by his DME was personally reviewed and interpreted by me today and showed an AHI of 0.2 /hr on 13 cm H2O with 100% compliance in using more than 4 hours nightly.  The patient has been using and benefiting from PAP use and will continue to benefit from therapy.    Time Spent: 20 minutes total time of encounter, including 15 minutes spent in face-to-face patient care on the date of this encounter. This time includes coordination of care and counseling regarding above mentioned problem  list. Remainder of non-face-to-face time involved reviewing chart documents/testing relevant to the patient encounter and documentation in the medical record. I have independently reviewed documentation from referring provider  Followup:  PRN  Medication Adjustments/Labs and Tests Ordered: Current medicines are reviewed at length with the patient today.  Concerns regarding medicines are outlined above.  Medication changes, Labs and Tests ordered today are listed in the Patient Instructions below.  There are no Patient Instructions on file for this visit.   Signed, Wilbert Bihari, MD  02/08/2024 8:43 AM    [facility] [address] [place], [place], [place]  [address] Phone: [phone]; Fax: [phone]

## 2024-02-19 LAB — NMR, LIPOPROFILE
Cholesterol, Total: 237 mg/dL — ABNORMAL HIGH (ref 100–199)
HDL Particle Number: 17.7 umol/L — ABNORMAL LOW (ref >=30.5)
HDL-C: 27 mg/dL — ABNORMAL LOW (ref >39)
LDL Particle Number: 2521 nmol/L — ABNORMAL HIGH (ref <1000)
LDL Size: 20.4 nm — ABNORMAL LOW (ref >20.5)
LDL-C (NIH Calc): 185 mg/dL — ABNORMAL HIGH (ref 0–99)
LP-IR Score: 67 — ABNORMAL HIGH (ref <=45)
Small LDL Particle Number: 1341 nmol/L — ABNORMAL HIGH (ref <=527)
Triglycerides: 132 mg/dL (ref 0–149)

## 2024-02-21 ENCOUNTER — Other Ambulatory Visit: Payer: Self-pay

## 2024-02-21 ENCOUNTER — Ambulatory Visit: Payer: Self-pay | Admitting: Cardiology

## 2024-02-21 DIAGNOSIS — E782 Mixed hyperlipidemia: Secondary | ICD-10-CM

## 2024-02-21 DIAGNOSIS — Z79899 Other long term (current) drug therapy: Secondary | ICD-10-CM

## 2024-02-21 NOTE — Progress Notes (Signed)
 Ok thanks! She has an appointment with me tomorrow.

## 2024-02-22 ENCOUNTER — Encounter: Payer: Self-pay | Admitting: Family Medicine

## 2024-02-22 ENCOUNTER — Ambulatory Visit: Admitting: Family Medicine

## 2024-02-22 VITALS — BP 100/68 | HR 80 | Temp 98.1°F | Ht 68.5 in | Wt 285.1 lb

## 2024-02-22 DIAGNOSIS — E119 Type 2 diabetes mellitus without complications: Secondary | ICD-10-CM

## 2024-02-22 DIAGNOSIS — E66813 Obesity, class 3: Secondary | ICD-10-CM

## 2024-02-22 DIAGNOSIS — Z7985 Long-term (current) use of injectable non-insulin antidiabetic drugs: Secondary | ICD-10-CM

## 2024-02-22 DIAGNOSIS — Z6841 Body Mass Index (BMI) 40.0 and over, adult: Secondary | ICD-10-CM | POA: Diagnosis not present

## 2024-02-22 DIAGNOSIS — E782 Mixed hyperlipidemia: Secondary | ICD-10-CM

## 2024-02-22 MED ORDER — MOUNJARO 12.5 MG/0.5ML ~~LOC~~ SOAJ: 12.5000 mg | SUBCUTANEOUS | 1 refills | Status: AC

## 2024-02-22 NOTE — Progress Notes (Signed)
 Established Patient Office Visit  Subjective   Patient ID: Darlene Mccormick, female    DOB: 07/10/1975  Age: 48 y.o. MRN: [id number]  Chief Complaint  Patient presents with   Medical Management of Chronic Issues    HPI Discussed the use of AI scribe software for clinical note transcription with the patient, who gave verbal consent to proceed.  History of Present Illness   Darlene Mccormick is a 48 year old female who presents for a follow-up on weight loss.  She is on a weight loss medication increased to 10 mg with a plan to advance to 12.5 mg. She has no dizziness, diarrhea, or nausea on the current dose.  Recent cardiology labs show a normal comprehensive metabolic panel, glucose 96, triglycerides 149, and persistently small LDL particle size with small particle number over 1000.  She has fatty liver disease with ALT and AST now in the normal range for the first time.  She has improved her diet and physical activity and uses CPAP nightly, which has significantly improved her sleep.       Current Outpatient Medications  Medication Instructions   Blood Glucose Monitoring Suppl DEVI 1 each, Does not apply, Daily, May substitute to any manufacturer covered by patient's insurance.   CREATINE PO 3 times weekly   diphenhydrAMINE  HCl (BENADRYL  PO) Take by mouth.   estradiol (CLIMARA - DOSED IN MG/24 HR) 0.025 mg, Weekly   fluticasone  (FLONASE  ALLERGY RELIEF) 50 MCG/ACT nasal spray As needed   Glucose Blood (BLOOD GLUCOSE TEST STRIPS) STRP 1 each, In Vitro, Daily, May substitute to any manufacturer covered by patient's insurance.   Magnesium 500 mg, Daily   Mounjaro  12.5 mg, Subcutaneous, Weekly, Ok to increase dose by 2.5 mg weekly after weeks on this dose.   Multiple Vitamin (MULTIVITAMIN WITH MINERALS) TABS tablet 1 tablet, Daily   norethindrone (MICRONOR) 0.35 MG tablet 1 tablet, Daily   VITAMIN D  PO 5,000 Units, Daily    Patient Active Problem List   Diagnosis  Date Noted   Obstructive sleep apnea 11/05/2023   Encounter for medication review and counseling 10/28/2023   Insulin  resistance 09/28/2022   Depression 02/23/2022   Mixed hyperlipidemia 12/30/2021   Hypocalcemia 05/15/2021   Metabolic dysfunction-associated steatotic liver disease (MASLD) 09/25/2019   Diabetes mellitus without complication (HCC) 06/08/2019   Class 3 severe obesity with serious comorbidity and body mass index (BMI) of 40.0 to 44.9 in adult [facility]) 05/17/2019   Fibroids 02/16/2018   S/P myomectomy 02/16/2018   Vitamin D  deficiency 06/10/2016   GAD (generalized anxiety disorder) 06/10/2016   Hyperlipidemia, mixed 06/03/2016   Morbid obesity (HCC) 06/03/2016     Review of Systems  All other systems reviewed and are negative.     Objective:     BP 100/68   Pulse 80   Temp 98.1 F (36.7 C) (Oral)   Ht 5' 8.5 (1.74 m)   Wt 285 lb 1.6 oz (129.3 kg)   LMP 02/03/2024   SpO2 98%   BMI 42.72 kg/m    Physical Exam Vitals reviewed.  Constitutional:      Appearance: Normal appearance. She is morbidly obese.  Pulmonary:     Effort: Pulmonary effort is normal.  Musculoskeletal:     Right lower leg: No edema.     Left lower leg: No edema.  Neurological:     Mental Status: She is alert and oriented to person, place, and time.  Psychiatric:  Mood and Affect: Mood normal.      No results found for any visits on 02/22/24.    The 10-year ASCVD risk score (Arnett DK, et al., 2019) is: 3%    Assessment & Plan:  Class 3 severe obesity due to excess calories with serious comorbidity and body mass index (BMI) of 40.0 to 44.9 in adult [facility])  Hyperlipidemia, mixed  Diabetes mellitus without complication (HCC) -     Mounjaro ; Inject 12.5 mg into the skin once a week. Ok to increase dose by 2.5 mg weekly after weeks on this dose.  Dispense: 2 mL; Refill: 1   Assessment and Plan    Obesity, class 3 Continued weight loss with positive trends in blood  pressure and metabolic panel. Current medication regimen is effective. - Increased Mounjaro  to 12.5 mg this month, then to 15 mg next month. - Instructed to finish current 10 mg doses before starting 12.5 mg. - Allowed early administration of Mounjaro  if needed.  Type 2 diabetes mellitus Glucose levels are improving with current treatment. A1c will be checked at next visit. - Continue current diabetes management plan. - Will check A1c at next visit.  Mixed hyperlipidemia Small LDL particle size and high small LDL numbers increase risk of heart disease. Current lifestyle changes are beneficial. Decision to delay cholesterol medication due to low immediate risk and ongoing lifestyle improvements. - Continue lifestyle modifications. - Will re-evaluate lipid panel in six months.  Obstructive sleep apnea CPAP machine is being used effectively, improving sleep quality. - Continue CPAP therapy.  Nonalcoholic fatty liver disease Liver function tests are normal for the first time, indicating improvement. - Continue current management and lifestyle modifications.        Return in about 6 weeks (around 04/04/2024) for weight loss.    Heron CHRISTELLA Sharper, MD

## 2024-03-02 LAB — COMPREHENSIVE METABOLIC PANEL WITH GFR
ALT: 16 IU/L (ref 0–32)
AST: 14 IU/L (ref 0–40)
Albumin: 4.6 g/dL (ref 3.9–4.9)
Alkaline Phosphatase: 67 IU/L (ref 41–116)
BUN/Creatinine Ratio: 13 (ref 9–23)
BUN: 10 mg/dL (ref 6–24)
Bilirubin Total: 1.1 mg/dL (ref 0.0–1.2)
CO2: 22 mmol/L (ref 20–29)
Calcium: 9.3 mg/dL (ref 8.7–10.2)
Chloride: 100 mmol/L (ref 96–106)
Creatinine, Ser: 0.79 mg/dL (ref 0.57–1.00)
Globulin, Total: 2.4 g/dL (ref 1.5–4.5)
Glucose: 96 mg/dL (ref 70–99)
Potassium: 5.1 mmol/L (ref 3.5–5.2)
Sodium: 138 mmol/L (ref 134–144)
Total Protein: 7 g/dL (ref 6.0–8.5)
eGFR: 93 mL/min/1.73 (ref >59)

## 2024-03-02 LAB — INSULIN, RANDOM: INSULIN: 39.2 u[IU]/mL — ABNORMAL HIGH (ref 2.6–24.9)

## 2024-03-02 LAB — VITAMIN D 1,25 DIHYDROXY
Vitamin D 1, 25 (OH)2 Total: 75 pg/mL — ABNORMAL HIGH
Vitamin D2 1, 25 (OH)2: 15 pg/mL
Vitamin D3 1, 25 (OH)2: 60 pg/mL

## 2024-03-02 LAB — HEMOGLOBIN A1C
Est. average glucose Bld gHb Est-mCnc: 126 mg/dL
Hgb A1c MFr Bld: 6 % — ABNORMAL HIGH (ref 4.8–5.6)

## 2024-04-04 ENCOUNTER — Encounter: Payer: Self-pay | Admitting: Family Medicine

## 2024-04-04 ENCOUNTER — Ambulatory Visit: Admitting: Family Medicine

## 2024-04-04 DIAGNOSIS — Z7985 Long-term (current) use of injectable non-insulin antidiabetic drugs: Secondary | ICD-10-CM

## 2024-04-04 DIAGNOSIS — Z6841 Body Mass Index (BMI) 40.0 and over, adult: Secondary | ICD-10-CM

## 2024-04-04 DIAGNOSIS — E119 Type 2 diabetes mellitus without complications: Secondary | ICD-10-CM

## 2024-04-04 NOTE — Progress Notes (Signed)
 "  Established Patient Office Visit  Subjective   Patient ID: Darlene Mccormick, female    DOB: 1975-11-15  Age: 49 y.o. MRN: [id number]  Chief Complaint  Patient presents with   Medical Management of Chronic Issues    HPI Discussed the use of AI scribe software for clinical note transcription with the patient, who gave verbal consent to proceed.  History of Present Illness   Darlene Mccormick is a 49 year old female who presents for a weight check and follow-up.  She has lost 25 pounds, which she attributes to improved sleep, increased physical activity, and consistent CPAP use. She recently began exercise classes and added home strength training.  She takes 10 mg of a weight management medication and had mild nausea with titrations from 2.5 to 5 mg and again at 10 mg, so she stayed longer at 10 mg. She has a prescription for 12.5 mg to start after finishing her current 10 mg supply and keeps Zofran  on hand for nausea.  Her bowel movements are regular with a high-fiber diet and nightly magnesium. Her vitamin D  level has improved. She is monitoring cholesterol and prefers to continue lifestyle and weight loss efforts before starting a statin.  Her A1c was last checked in November. She focuses on maintaining muscle and bone mass with resistance training and a high-protein diet of about 100 grams daily.       Current Outpatient Medications  Medication Instructions   Blood Glucose Monitoring Suppl DEVI 1 each, Does not apply, Daily, May substitute to any manufacturer covered by patient's insurance.   CREATINE PO 3 times weekly   diphenhydrAMINE  HCl (BENADRYL  PO) Take by mouth.   estradiol (CLIMARA - DOSED IN MG/24 HR) 0.025 mg, Weekly   fluticasone  (FLONASE  ALLERGY RELIEF) 50 MCG/ACT nasal spray As needed   Glucose Blood (BLOOD GLUCOSE TEST STRIPS) STRP 1 each, In Vitro, Daily, May substitute to any manufacturer covered by patient's insurance.   Magnesium 500 mg, Daily    Mounjaro  12.5 mg, Subcutaneous, Weekly, Ok to increase dose by 2.5 mg weekly after weeks on this dose.   Multiple Vitamin (MULTIVITAMIN WITH MINERALS) TABS tablet 1 tablet, Daily   norethindrone (MICRONOR) 0.35 MG tablet 1 tablet, Daily   VITAMIN D  PO 5,000 Units, Daily    Patient Active Problem List   Diagnosis Date Noted   Obstructive sleep apnea 11/05/2023   Encounter for medication review and counseling 10/28/2023   Insulin  resistance 09/28/2022   Depression 02/23/2022   Mixed hyperlipidemia 12/30/2021   Hypocalcemia 05/15/2021   Metabolic dysfunction-associated steatotic liver disease (MASLD) 09/25/2019   Diabetes mellitus without complication (HCC) 06/08/2019   Class 3 severe obesity with serious comorbidity and body mass index (BMI) of 40.0 to 44.9 in adult [facility]) 05/17/2019   Fibroids 02/16/2018   S/P myomectomy 02/16/2018   Vitamin D  deficiency 06/10/2016   GAD (generalized anxiety disorder) 06/10/2016   Hyperlipidemia, mixed 06/03/2016   Morbid obesity (HCC) 06/03/2016     Review of Systems  All other systems reviewed and are negative.     Objective:     BP 100/70   Pulse 77   Temp 98.2 F (36.8 C) (Oral)   Ht 5' 8.5 (1.74 m)   Wt 279 lb 6.4 oz (126.7 kg)   LMP 04/01/2024 (Exact Date)   SpO2 99%   BMI 41.86 kg/m    Physical Exam Vitals reviewed.  Constitutional:      Appearance: Normal appearance. She is morbidly obese.  Cardiovascular:     Rate and Rhythm: Normal rate and regular rhythm.  Pulmonary:     Effort: Pulmonary effort is normal.  Abdominal:     General: Bowel sounds are normal.  Neurological:     Mental Status: She is alert.      No results found for any visits on 04/04/24.    The 10-year ASCVD risk score (Arnett DK, et al., 2019) is: 3.1%    Assessment & Plan:  Morbid obesity (HCC)  Diabetes mellitus without complication (HCC)   Assessment and Plan    Morbid obesity Significant weight loss achieved with current  medication regimen. Currently on 10 mg dose with plans to increase to 12.5 mg. Reports nausea with dose increases, managed with Zofran . Engaging in regular exercise and dietary modifications, including increased protein intake and resistance training, to prevent muscle loss. Discussed potential for nausea and diarrhea with dose increases and long-term use. Emphasized importance of resistance training and high protein diet to retain muscle mass during weight loss. - Continue current medication at 10 mg for one more month before increasing to 12.5 mg. - Monitor for nausea and provide Zofran  as needed. - Encouraged resistance training and high protein diet to retain muscle mass. - Will reassess weight and medication tolerance in two months.  Type 2 diabetes mellitus A1c was last checked in November. Plan to monitor A1c levels to assess impact of weight loss on glycemic control. No immediate need for medication adjustment based on current weight loss progress. - Will schedule A1c test for the last week of February. - Continue monitoring weight loss and its impact on diabetes management.        Return in about 7 weeks (around 05/23/2024) for weight loss.    Heron CHRISTELLA Sharper, MD "

## 2024-04-28 ENCOUNTER — Telehealth: Payer: Self-pay | Admitting: Cardiology

## 2024-04-28 NOTE — Telephone Encounter (Signed)
 Pt states her and PCP decided to start her on   tirzepatide  (MOUNJARO ) 12.5 MG/0.5ML Pen   And they will monitor her cholesterol for 6-12 months and if it does not get better then they will revisit the recommendation.

## 2024-05-23 ENCOUNTER — Ambulatory Visit: Admitting: Family Medicine
